# Patient Record
Sex: Female | Born: 1975 | Race: White | Hispanic: No | Smoking: Never smoker
Health system: Southern US, Community
[De-identification: ages and names within clinical notes are randomized; demographics above are authoritative.]

## PROBLEM LIST (undated history)

## (undated) DIAGNOSIS — O020 Blighted ovum and nonhydatidiform mole: Secondary | ICD-10-CM

## (undated) DIAGNOSIS — F419 Anxiety disorder, unspecified: Secondary | ICD-10-CM

## (undated) DIAGNOSIS — Z8719 Personal history of other diseases of the digestive system: Secondary | ICD-10-CM

## (undated) DIAGNOSIS — C801 Malignant (primary) neoplasm, unspecified: Secondary | ICD-10-CM

## (undated) DIAGNOSIS — K219 Gastro-esophageal reflux disease without esophagitis: Secondary | ICD-10-CM

## (undated) DIAGNOSIS — J189 Pneumonia, unspecified organism: Secondary | ICD-10-CM

## (undated) HISTORY — PX: HERNIA REPAIR: SHX51

## (undated) HISTORY — PX: DILATION AND CURETTAGE OF UTERUS: SHX78

## (undated) HISTORY — DX: Blighted ovum and nonhydatidiform mole: O02.0

## (undated) HISTORY — PX: ABDOMINAL HYSTERECTOMY: SHX81

## (undated) HISTORY — PX: APPENDECTOMY: SHX54

## (undated) HISTORY — PX: ESOPHAGOGASTRODUODENOSCOPY: SHX1529

---

## 2015-09-04 ENCOUNTER — Other Ambulatory Visit (HOSPITAL_COMMUNITY): Payer: Self-pay | Admitting: General Surgery

## 2015-09-12 ENCOUNTER — Other Ambulatory Visit: Payer: Self-pay

## 2015-09-12 ENCOUNTER — Ambulatory Visit (HOSPITAL_COMMUNITY)
Admission: RE | Admit: 2015-09-12 | Discharge: 2015-09-12 | Disposition: A | Discharge status: Home / Self Care | Payer: BC Managed Care – PPO | Source: Ambulatory Visit | Attending: General Surgery | Admitting: General Surgery

## 2015-09-12 DIAGNOSIS — Z01818 Encounter for other preprocedural examination: Secondary | ICD-10-CM | POA: Diagnosis not present

## 2015-09-12 DIAGNOSIS — K449 Diaphragmatic hernia without obstruction or gangrene: Secondary | ICD-10-CM | POA: Diagnosis not present

## 2015-09-16 ENCOUNTER — Ambulatory Visit (HOSPITAL_COMMUNITY): Payer: Self-pay

## 2015-09-23 ENCOUNTER — Encounter: Payer: Self-pay | Admitting: Skilled Nursing Facility1

## 2015-09-23 ENCOUNTER — Encounter: Payer: BC Managed Care – PPO | Attending: General Surgery | Admitting: Skilled Nursing Facility1

## 2015-09-23 DIAGNOSIS — E669 Obesity, unspecified: Secondary | ICD-10-CM

## 2015-09-23 DIAGNOSIS — Z6841 Body Mass Index (BMI) 40.0 and over, adult: Secondary | ICD-10-CM | POA: Insufficient documentation

## 2015-09-23 NOTE — Progress Notes (Signed)
  Pre-Op Assessment Visit:  Pre-Operative Sleeve Gastrectomy Surgery  Medical Nutrition Therapy:  Appt start time: 1100   End time:  1200.  Patient was seen on 09/23/2015 for Pre-Operative Nutrition Assessment. Assessment and letter of approval faxed to [facility] Surgery Bariatric Surgery Program coordinator on 09/23/2015.  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 Pts sister accompanied her to the appointment.  Pt states she has a great support system. Preferred Learning Style:   No preference indicated   Learning Readiness:   Ready  Handouts given during visit include:  Pre-Op Goals Bariatric Surgery Protein Shakes   During the appointment today the following Pre-Op Goals were reviewed with the patient: Maintain or lose weight as instructed by your surgeon Make healthy food choices Begin to limit portion sizes Limited concentrated sugars and fried foods Keep fat/sugar in the single digits per serving on   food labels Practice CHEWING your food  (aim for 30 chews per bite or until applesauce consistency) Practice not drinking 15 minutes before, during, and 30 minutes after each meal/snack Avoid all carbonated beverages  Avoid/limit caffeinated beverages  Avoid all sugar-sweetened beverages Consume 3 meals per day; eat every 3-5 hours Make a list of non-food related activities Aim for 64-100 ounces of FLUID daily  Aim for at least 60-80 grams of PROTEIN daily Look for a liquid protein source that contain ?15 g protein and ?5 g carbohydrate  (ex: shakes, drinks, shots)  Patient-Centered Goals: 10/10 specific/non-scale and confidence/importance scale 1-10  Demonstrated degree of understanding via:  Teach Back  Teaching Method Utilized:  Visual Auditory Hands on  Barriers to learning/adherence to lifestyle change: none identified  [facility] to call the [facility] to enroll in Pre-Op and Post-Op Nutrition Education when  surgery date is scheduled.

## 2015-09-25 ENCOUNTER — Ambulatory Visit (INDEPENDENT_AMBULATORY_CARE_PROVIDER_SITE_OTHER): Payer: BC Managed Care – PPO | Admitting: Licensed Clinical Social Worker

## 2015-09-25 DIAGNOSIS — F4322 Adjustment disorder with anxiety: Secondary | ICD-10-CM

## 2015-09-30 ENCOUNTER — Ambulatory Visit (INDEPENDENT_AMBULATORY_CARE_PROVIDER_SITE_OTHER): Payer: BC Managed Care – PPO | Admitting: Licensed Clinical Social Worker

## 2015-10-29 ENCOUNTER — Encounter: Payer: BC Managed Care – PPO | Attending: General Surgery

## 2015-10-29 DIAGNOSIS — Z6841 Body Mass Index (BMI) 40.0 and over, adult: Secondary | ICD-10-CM | POA: Insufficient documentation

## 2015-10-29 NOTE — Progress Notes (Signed)
  Pre-Operative Nutrition Class:  Appt start time: 2426   End time:  1830.  Patient was seen on 10/29/2015 for Pre-Operative Bariatric Surgery Education at the [facility].   Surgery date:  Surgery type: Sleeve gastrectomy Start weight at [facility]: 307 lbs on 09/23/2015 Weight today: 310.8 lbs  TANITA  BODY COMP RESULTS  10/29/15   BMI (kg/m^2) 53.3   Fat Mass (lbs) 167.8   Fat Free Mass (lbs) 143   Total Body Water (lbs) 107.2   Samples given per MNT protocol. Patient educated on appropriate usage: Bariatric Advantage Multivitamin (mixed berry - qty 1) Lot #: [id number] Exp: 08/2016  Bariatric Advantage Calcium Citrate chew (caramel - qty 1) Lot #: [id number] Exp: 12/2015  Premier protein shake (vanilla - qty 1) Lot #: [id number] Exp: 09/2016  Renee Pain Protein Powder (chocolate - qty 1) Lot #: [id number] Exp: 11/2016  The following the learning objectives were met by the patient during this course:  Identify Pre-Op Dietary Goals and will begin 2 weeks pre-operatively  Identify appropriate sources of fluids and proteins   State protein recommendations and appropriate sources pre and post-operatively  Identify Post-Operative Dietary Goals and will follow for 2 weeks post-operatively  Identify appropriate multivitamin and calcium sources  Describe the need for physical activity post-operatively and will follow MD recommendations  State when to call healthcare provider regarding medication questions or post-operative complications  Handouts given during class include:  Pre-Op Bariatric Surgery Diet Handout  Protein Shake Handout  Post-Op Bariatric Surgery Nutrition Handout  [facility] Program Information Flyer  Support Group Information Flyer  [facility] Bariatric Supplements Price List  Follow-Up Plan: Patient will follow-up at [facility] 2 weeks post operatively for diet advancement per MD.

## 2015-10-29 NOTE — Progress Notes (Signed)
Pt is being scheduled for preop appt; please place surgical orders in epic. Thanks.  

## 2015-10-30 ENCOUNTER — Other Ambulatory Visit: Payer: Self-pay | Admitting: General Surgery

## 2015-11-01 ENCOUNTER — Ambulatory Visit: Payer: Self-pay | Admitting: General Surgery

## 2015-11-01 ENCOUNTER — Other Ambulatory Visit: Payer: Self-pay | Admitting: General Surgery

## 2015-11-06 NOTE — Patient Instructions (Addendum)
Mary Williams  11/06/2015   Your procedure is scheduled on: Tuesday 11/12/2015  Report to [facility] Main  Entrance take [place]  elevators to 3rd floor to  [place] at   [place] AM.  Call this number if you have problems the morning of surgery [phone]   Remember: ONLY 1 PERSON MAY GO WITH YOU TO SHORT STAY TO GET  READY MORNING OF [name].   Do not eat food or drink liquids :After Midnight.     Take these medicines the morning of surgery with A SIP OF WATER: none                                You may not have any metal on your body including hair pins and              piercings  Do not wear jewelry, make-up, lotions, powders or perfumes, deodorant             Do not wear nail polish.  Do not shave  48 hours prior to surgery.              Men may shave face and neck.   Do not bring valuables to the hospital. [name].  Contacts, dentures or bridgework may not be worn into surgery.  Leave suitcase in the car. After surgery it may be brought to your room.                Please read over the following fact sheets you were given: _____________________________________________________________________             [facility] - Preparing for Surgery Before surgery, you can play an important role.  Because skin is not sterile, your skin needs to be as free of germs as possible.  You can reduce the number of germs on your skin by washing with CHG (chlorahexidine gluconate) soap before surgery.  CHG is an antiseptic cleaner which kills germs and bonds with the skin to continue killing germs even after washing. Please DO NOT use if you have an allergy to CHG or antibacterial soaps.  If your skin becomes reddened/irritated stop using the CHG and inform your nurse when you arrive at Short Stay. Do not shave (including legs and underarms) for at least 48 hours prior to the first CHG shower.  You may shave  your face/neck. Please follow these instructions carefully:  1.  Shower with CHG Soap the night before surgery and the  morning of Surgery.  2.  If you choose to wash your hair, wash your hair first as usual with your  normal  shampoo.  3.  After you shampoo, rinse your hair and body thoroughly to remove the  shampoo.                           4.  Use CHG as you would any other liquid soap.  You can apply chg directly  to the skin and wash                       Gently with a scrungie or clean washcloth.  5.  Apply the  CHG Soap to your body ONLY FROM THE NECK DOWN.   Do not use on face/ open                           Wound or open sores. Avoid contact with eyes, ears mouth and genitals (private parts).                       Wash face,  Genitals (private parts) with your normal soap.             6.  Wash thoroughly, paying special attention to the area where your surgery  will be performed.  7.  Thoroughly rinse your body with warm water from the neck down.  8.  DO NOT shower/wash with your normal soap after using and rinsing off  the CHG Soap.                9.  Pat yourself dry with a clean towel.            10.  Wear clean pajamas.            11.  Place clean sheets on your bed the night of your first shower and do not  sleep with pets. Day of Surgery : Do not apply any lotions/deodorants the morning of surgery.  Please wear clean clothes to the hospital/surgery center.  FAILURE TO FOLLOW THESE INSTRUCTIONS MAY RESULT IN THE CANCELLATION OF YOUR SURGERY PATIENT SIGNATURE_________________________________  NURSE SIGNATURE__________________________________  ________________________________________________________________________   Mary Williams  An incentive spirometer is a tool that can help keep your lungs clear and active. This tool measures how well you are filling your lungs with each breath. Taking long deep breaths may help reverse or decrease the chance of developing  breathing (pulmonary) problems (especially infection) following:  A long period of time when you are unable to move or be active. BEFORE THE PROCEDURE   If the spirometer includes an indicator to show your best effort, your nurse or respiratory therapist will set it to a desired goal.  If possible, sit up straight or lean slightly forward. Try not to slouch.  Hold the incentive spirometer in an upright position. INSTRUCTIONS FOR USE  1. Sit on the edge of your bed if possible, or sit up as far as you can in bed or on a chair. 2. Hold the incentive spirometer in an upright position. 3. Breathe out normally. 4. Place the mouthpiece in your mouth and seal your lips tightly around it. 5. Breathe in slowly and as deeply as possible, raising the piston or the ball toward the top of the column. 6. Hold your breath for 3-5 seconds or for as long as possible. Allow the piston or ball to fall to the bottom of the column. 7. Remove the mouthpiece from your mouth and breathe out normally. 8. Rest for a few seconds and repeat Steps 1 through 7 at least 10 times every 1-2 hours when you are awake. Take your time and take a few normal breaths between deep breaths. 9. The spirometer may include an indicator to show your best effort. Use the indicator as a goal to work toward during each repetition. 10. After each set of 10 deep breaths, practice coughing to be sure your lungs are clear. If you have an incision (the cut made at the time of surgery), support your incision when coughing by placing a pillow or rolled up  towels firmly against it. Once you are able to get out of bed, walk around indoors and cough well. You may stop using the incentive spirometer when instructed by your caregiver.  RISKS AND COMPLICATIONS  Take your time so you do not get dizzy or light-headed.  If you are in pain, you may need to take or ask for pain medication before doing incentive spirometry. It is harder to take a deep  breath if you are having pain. AFTER USE  Rest and breathe slowly and easily.  It can be helpful to keep track of a log of your progress. Your caregiver can provide you with a simple table to help with this. If you are using the spirometer at home, follow these instructions: [place] IF:   You are having difficultly using the spirometer.  You have trouble using the spirometer as often as instructed.  Your pain medication is not giving enough relief while using the spirometer.  You develop fever of 100.5 F (38.1 C) or higher. SEEK IMMEDIATE MEDICAL CARE IF:   You cough up bloody sputum that had not been present before.  You develop fever of 102 F (38.9 C) or greater.  You develop worsening pain at or near the incision site. MAKE SURE YOU:   Understand these instructions.  Will watch your condition.  Will get help right away if you are not doing well or get worse. Document Released: 06/22/2006 Document Revised: 05/04/2011 Document Reviewed: 08/23/2006 [facility] Patient Information 2014 [place], [place].   ________________________________________________________________________

## 2015-11-08 ENCOUNTER — Encounter (HOSPITAL_COMMUNITY)
Admission: RE | Admit: 2015-11-08 | Discharge: 2015-11-08 | Disposition: A | Discharge status: Home / Self Care | Payer: BC Managed Care – PPO | Source: Ambulatory Visit | Attending: General Surgery | Admitting: General Surgery

## 2015-11-08 ENCOUNTER — Encounter (INDEPENDENT_AMBULATORY_CARE_PROVIDER_SITE_OTHER): Payer: Self-pay

## 2015-11-08 ENCOUNTER — Encounter (HOSPITAL_COMMUNITY): Payer: Self-pay

## 2015-11-08 DIAGNOSIS — Z01812 Encounter for preprocedural laboratory examination: Secondary | ICD-10-CM | POA: Insufficient documentation

## 2015-11-08 HISTORY — DX: Anxiety disorder, unspecified: F41.9

## 2015-11-08 HISTORY — DX: Personal history of other diseases of the digestive system: Z87.19

## 2015-11-08 LAB — COMPREHENSIVE METABOLIC PANEL
ALBUMIN: 4 g/dL (ref 3.5–5.0)
ALK PHOS: 52 U/L (ref 38–126)
ALT: 19 U/L (ref 14–54)
ANION GAP: 6 (ref 5–15)
AST: 26 U/L (ref 15–41)
BILIRUBIN TOTAL: 0.4 mg/dL (ref 0.3–1.2)
BUN: 15 mg/dL (ref 6–20)
CALCIUM: 8.9 mg/dL (ref 8.9–10.3)
CO2: 26 mmol/L (ref 22–32)
Chloride: 106 mmol/L (ref 101–111)
Creatinine, Ser: 0.84 mg/dL (ref 0.44–1.00)
GLUCOSE: 157 mg/dL — AB (ref 65–99)
Potassium: 4 mmol/L (ref 3.5–5.1)
Sodium: 138 mmol/L (ref 135–145)
TOTAL PROTEIN: 7.8 g/dL (ref 6.5–8.1)

## 2015-11-08 LAB — CBC WITH DIFFERENTIAL/PLATELET
Basophils Absolute: 0 10*3/uL (ref 0.0–0.1)
Basophils Relative: 0 %
Eosinophils Absolute: 0.2 10*3/uL (ref 0.0–0.7)
Eosinophils Relative: 2 %
HEMATOCRIT: 40.2 % (ref 36.0–46.0)
HEMOGLOBIN: 13.1 g/dL (ref 12.0–15.0)
LYMPHS ABS: 3.2 10*3/uL (ref 0.7–4.0)
Lymphocytes Relative: 26 %
MCH: 28.1 pg (ref 26.0–34.0)
MCHC: 32.6 g/dL (ref 30.0–36.0)
MCV: 86.3 fL (ref 78.0–100.0)
MONOS PCT: 5 %
Monocytes Absolute: 0.6 10*3/uL (ref 0.1–1.0)
NEUTROS ABS: 8.3 10*3/uL — AB (ref 1.7–7.7)
NEUTROS PCT: 67 %
Platelets: 365 10*3/uL (ref 150–400)
RBC: 4.66 MIL/uL (ref 3.87–5.11)
RDW: 13.9 % (ref 11.5–15.5)
WBC: 12.3 10*3/uL — ABNORMAL HIGH (ref 4.0–10.5)

## 2015-11-08 NOTE — Progress Notes (Signed)
09/12/2015-noted in [facility] and EKG

## 2015-11-11 NOTE — Anesthesia Preprocedure Evaluation (Signed)
Anesthesia Evaluation  Patient identified by MRN, date of birth, ID band Patient awake    Reviewed: Allergy & Precautions, NPO status , Patient's Chart, lab work & pertinent test results  Airway Mallampati: II  TM Distance: >3 FB Neck ROM: Full    Dental no notable dental hx.    Pulmonary neg pulmonary ROS,    Pulmonary exam normal breath sounds clear to auscultation       Cardiovascular negative cardio ROS Normal cardiovascular exam Rhythm:Regular Rate:Normal     Neuro/Psych PSYCHIATRIC DISORDERS Anxiety negative neurological ROS     GI/Hepatic negative GI ROS, Neg liver ROS, hiatal hernia,   Endo/Other  Morbid obesity  Renal/GU negative Renal ROS     Musculoskeletal negative musculoskeletal ROS (+)   Abdominal   Peds  Hematology negative hematology ROS (+)   Anesthesia Other Findings   Reproductive/Obstetrics negative OB ROS                            Anesthesia Physical Anesthesia Plan  ASA: III  Anesthesia Plan: General   Post-op Pain Management:    Induction: Intravenous  Airway Management Planned: Oral ETT  Additional Equipment:   Intra-op Plan:   Post-operative Plan: Extubation in OR  Informed Consent: I have reviewed the patients History and Physical, chart, labs and discussed the procedure including the risks, benefits and alternatives for the proposed anesthesia with the patient or authorized representative who has indicated his/her understanding and acceptance.   Dental advisory given  Plan Discussed with: CRNA  Anesthesia Plan Comments:         Anesthesia Quick Evaluation

## 2015-11-12 ENCOUNTER — Inpatient Hospital Stay (HOSPITAL_COMMUNITY)
Admission: RE | Admit: 2015-11-12 | Discharge: 2015-11-15 | DRG: 621 | Disposition: A | Discharge status: Home / Self Care | Payer: BC Managed Care – PPO | Source: Ambulatory Visit | Attending: General Surgery | Admitting: General Surgery

## 2015-11-12 ENCOUNTER — Inpatient Hospital Stay (HOSPITAL_COMMUNITY): Payer: BC Managed Care – PPO | Admitting: Anesthesiology

## 2015-11-12 ENCOUNTER — Encounter (HOSPITAL_COMMUNITY)
Admission: RE | Disposition: A | Discharge status: Home / Self Care | Payer: Self-pay | Source: Ambulatory Visit | Attending: General Surgery

## 2015-11-12 ENCOUNTER — Encounter (HOSPITAL_COMMUNITY): Payer: Self-pay | Admitting: *Deleted

## 2015-11-12 DIAGNOSIS — Z90711 Acquired absence of uterus with remaining cervical stump: Secondary | ICD-10-CM

## 2015-11-12 DIAGNOSIS — Z79899 Other long term (current) drug therapy: Secondary | ICD-10-CM | POA: Diagnosis not present

## 2015-11-12 DIAGNOSIS — K449 Diaphragmatic hernia without obstruction or gangrene: Secondary | ICD-10-CM | POA: Diagnosis present

## 2015-11-12 DIAGNOSIS — K219 Gastro-esophageal reflux disease without esophagitis: Secondary | ICD-10-CM | POA: Diagnosis present

## 2015-11-12 DIAGNOSIS — D72829 Elevated white blood cell count, unspecified: Secondary | ICD-10-CM | POA: Diagnosis not present

## 2015-11-12 DIAGNOSIS — Z8249 Family history of ischemic heart disease and other diseases of the circulatory system: Secondary | ICD-10-CM | POA: Diagnosis not present

## 2015-11-12 DIAGNOSIS — Z803 Family history of malignant neoplasm of breast: Secondary | ICD-10-CM | POA: Diagnosis not present

## 2015-11-12 DIAGNOSIS — Z9884 Bariatric surgery status: Secondary | ICD-10-CM

## 2015-11-12 DIAGNOSIS — R110 Nausea: Secondary | ICD-10-CM | POA: Diagnosis not present

## 2015-11-12 DIAGNOSIS — Z833 Family history of diabetes mellitus: Secondary | ICD-10-CM | POA: Diagnosis not present

## 2015-11-12 DIAGNOSIS — Z6841 Body Mass Index (BMI) 40.0 and over, adult: Secondary | ICD-10-CM

## 2015-11-12 DIAGNOSIS — Z8261 Family history of arthritis: Secondary | ICD-10-CM

## 2015-11-12 DIAGNOSIS — G8929 Other chronic pain: Secondary | ICD-10-CM | POA: Diagnosis present

## 2015-11-12 DIAGNOSIS — M549 Dorsalgia, unspecified: Secondary | ICD-10-CM | POA: Diagnosis present

## 2015-11-12 DIAGNOSIS — F419 Anxiety disorder, unspecified: Secondary | ICD-10-CM | POA: Diagnosis present

## 2015-11-12 HISTORY — PX: LAPAROSCOPIC GASTRIC SLEEVE RESECTION WITH HIATAL HERNIA REPAIR: SHX6512

## 2015-11-12 HISTORY — PX: UPPER GI ENDOSCOPY: SHX6162

## 2015-11-12 LAB — PREGNANCY, URINE: Preg Test, Ur: NEGATIVE

## 2015-11-12 LAB — HEMOGLOBIN AND HEMATOCRIT, BLOOD
HCT: 40.1 % (ref 36.0–46.0)
Hemoglobin: 13.1 g/dL (ref 12.0–15.0)

## 2015-11-12 SURGERY — GASTRECTOMY, SLEEVE, LAPAROSCOPIC, WITH HIATAL HERNIA REPAIR
Anesthesia: General | Site: Abdomen

## 2015-11-12 MED ORDER — CHLORHEXIDINE GLUCONATE CLOTH 2 % EX PADS: 6.0000 | MEDICATED_PAD | Freq: Once | CUTANEOUS | Status: DC

## 2015-11-12 MED ORDER — FENTANYL CITRATE (PF) 100 MCG/2ML IJ SOLN
INTRAMUSCULAR | Status: AC
  Filled 2015-11-12: qty 4

## 2015-11-12 MED ORDER — APREPITANT 40 MG PO CAPS
40.0000 mg | ORAL_CAPSULE | ORAL | Status: AC
  Administered 2015-11-12: 40 mg via ORAL
  Filled 2015-11-12: qty 1

## 2015-11-12 MED ORDER — BUPIVACAINE-EPINEPHRINE 0.25% -1:200000 IJ SOLN
INTRAMUSCULAR | Status: DC | PRN
  Administered 2015-11-12: 35 mL

## 2015-11-12 MED ORDER — LIDOCAINE 2% (20 MG/ML) 5 ML SYRINGE
INTRAMUSCULAR | Status: AC
  Filled 2015-11-12: qty 5

## 2015-11-12 MED ORDER — EVICEL 5 ML EX KIT
PACK | CUTANEOUS | Status: DC | PRN
  Administered 2015-11-12: 1

## 2015-11-12 MED ORDER — ACETAMINOPHEN 10 MG/ML IV SOLN
INTRAVENOUS | Status: DC | PRN
  Administered 2015-11-12: 1000 mg via INTRAVENOUS

## 2015-11-12 MED ORDER — DEXAMETHASONE SODIUM PHOSPHATE 10 MG/ML IJ SOLN
INTRAMUSCULAR | Status: AC
  Filled 2015-11-12: qty 1

## 2015-11-12 MED ORDER — PROMETHAZINE HCL 25 MG/ML IJ SOLN
6.2500 mg | INTRAMUSCULAR | Status: DC | PRN
  Administered 2015-11-12: 6.25 mg via INTRAVENOUS

## 2015-11-12 MED ORDER — POTASSIUM CHLORIDE IN NACL 20-0.9 MEQ/L-% IV SOLN
INTRAVENOUS | Status: DC
  Administered 2015-11-12: 14:00:00 via INTRAVENOUS
  Administered 2015-11-13: 100 mL/h via INTRAVENOUS
  Administered 2015-11-13 – 2015-11-15 (×2): via INTRAVENOUS
  Filled 2015-11-12 (×7): qty 1000

## 2015-11-12 MED ORDER — PHENYLEPHRINE HCL 10 MG/ML IJ SOLN
INTRAMUSCULAR | Status: DC | PRN
  Administered 2015-11-12: 80 ug via INTRAVENOUS

## 2015-11-12 MED ORDER — LIP MEDEX EX OINT
TOPICAL_OINTMENT | CUTANEOUS | Status: AC
  Filled 2015-11-12: qty 7

## 2015-11-12 MED ORDER — 0.9 % SODIUM CHLORIDE (POUR BTL) OPTIME
TOPICAL | Status: DC | PRN
  Administered 2015-11-12: 1000 mL

## 2015-11-12 MED ORDER — MIDAZOLAM HCL 2 MG/2ML IJ SOLN
INTRAMUSCULAR | Status: AC
  Filled 2015-11-12: qty 2

## 2015-11-12 MED ORDER — BUPIVACAINE-EPINEPHRINE 0.5% -1:200000 IJ SOLN
INTRAMUSCULAR | Status: AC
  Filled 2015-11-12: qty 1

## 2015-11-12 MED ORDER — ONDANSETRON HCL 4 MG/2ML IJ SOLN
INTRAMUSCULAR | Status: DC | PRN
  Administered 2015-11-12: 4 mg via INTRAVENOUS

## 2015-11-12 MED ORDER — LIDOCAINE HCL (CARDIAC) 20 MG/ML IV SOLN
INTRAVENOUS | Status: DC | PRN
  Administered 2015-11-12: 100 mg via INTRAVENOUS

## 2015-11-12 MED ORDER — PROMETHAZINE HCL 25 MG/ML IJ SOLN
INTRAMUSCULAR | Status: AC
Start: 2015-11-12 — End: 2015-11-12
  Filled 2015-11-12: qty 1

## 2015-11-12 MED ORDER — EVICEL 5 ML EX KIT
PACK | Freq: Once | CUTANEOUS | Status: DC
  Filled 2015-11-12: qty 1

## 2015-11-12 MED ORDER — ROCURONIUM BROMIDE 100 MG/10ML IV SOLN
INTRAVENOUS | Status: AC
  Filled 2015-11-12: qty 1

## 2015-11-12 MED ORDER — PROPOFOL 10 MG/ML IV BOLUS
INTRAVENOUS | Status: DC | PRN
  Administered 2015-11-12: 200 mg via INTRAVENOUS

## 2015-11-12 MED ORDER — ROCURONIUM BROMIDE 100 MG/10ML IV SOLN
INTRAVENOUS | Status: DC | PRN
  Administered 2015-11-12: 20 mg via INTRAVENOUS
  Administered 2015-11-12: 40 mg via INTRAVENOUS

## 2015-11-12 MED ORDER — SODIUM CHLORIDE 0.9 % IJ SOLN
INTRAMUSCULAR | Status: AC
  Filled 2015-11-12: qty 10

## 2015-11-12 MED ORDER — ACETAMINOPHEN 160 MG/5ML PO SOLN: 325.0000 mg | ORAL | Status: DC | PRN

## 2015-11-12 MED ORDER — ENOXAPARIN SODIUM 30 MG/0.3ML ~~LOC~~ SOLN
30.0000 mg | Freq: Two times a day (BID) | SUBCUTANEOUS | Status: DC
  Administered 2015-11-12 – 2015-11-14 (×5): 30 mg via SUBCUTANEOUS
  Filled 2015-11-12 (×5): qty 0.3

## 2015-11-12 MED ORDER — HYDROMORPHONE HCL 1 MG/ML IJ SOLN
INTRAMUSCULAR | Status: AC
  Filled 2015-11-12: qty 1

## 2015-11-12 MED ORDER — ONDANSETRON HCL 4 MG/2ML IJ SOLN
4.0000 mg | INTRAMUSCULAR | Status: DC | PRN
  Administered 2015-11-12 – 2015-11-15 (×11): 4 mg via INTRAVENOUS
  Filled 2015-11-12 (×11): qty 2

## 2015-11-12 MED ORDER — ACETAMINOPHEN 160 MG/5ML PO SOLN
650.0000 mg | ORAL | Status: DC | PRN
  Administered 2015-11-14 – 2015-11-15 (×2): 650 mg via ORAL
  Filled 2015-11-12 (×2): qty 20.3

## 2015-11-12 MED ORDER — DEXAMETHASONE SODIUM PHOSPHATE 10 MG/ML IJ SOLN
INTRAMUSCULAR | Status: DC | PRN
  Administered 2015-11-12: 10 mg via INTRAVENOUS

## 2015-11-12 MED ORDER — BUPIVACAINE LIPOSOME 1.3 % IJ SUSP
INTRAMUSCULAR | Status: DC | PRN
  Administered 2015-11-12: 20 mL

## 2015-11-12 MED ORDER — OXYCODONE HCL 5 MG PO TABS: 5.0000 mg | ORAL_TABLET | Freq: Once | ORAL | Status: DC | PRN

## 2015-11-12 MED ORDER — MIDAZOLAM HCL 5 MG/5ML IJ SOLN
INTRAMUSCULAR | Status: DC | PRN
  Administered 2015-11-12: 2 mg via INTRAVENOUS

## 2015-11-12 MED ORDER — OXYCODONE HCL 5 MG/5ML PO SOLN: 5.0000 mg | Freq: Once | ORAL | Status: DC | PRN

## 2015-11-12 MED ORDER — OXYCODONE HCL 5 MG/5ML PO SOLN
5.0000 mg | ORAL | Status: DC | PRN
  Administered 2015-11-13 (×2): 10 mg via ORAL
  Administered 2015-11-13: 5 mg via ORAL
  Administered 2015-11-14 – 2015-11-15 (×8): 10 mg via ORAL
  Filled 2015-11-12: qty 5
  Filled 2015-11-12 (×11): qty 10

## 2015-11-12 MED ORDER — LEVOFLOXACIN IN D5W 750 MG/150ML IV SOLN
750.0000 mg | INTRAVENOUS | Status: AC
  Administered 2015-11-12: 750 mg via INTRAVENOUS
  Filled 2015-11-12: qty 150

## 2015-11-12 MED ORDER — PREMIER PROTEIN SHAKE
2.0000 [oz_av] | ORAL | Status: DC
  Administered 2015-11-14 – 2015-11-15 (×7): 2 [oz_av] via ORAL
  Filled 2015-11-12: qty 325.31

## 2015-11-12 MED ORDER — SODIUM CHLORIDE 0.9 % IJ SOLN
INTRAMUSCULAR | Status: DC | PRN
  Administered 2015-11-12: 60 mL via INTRAVENOUS

## 2015-11-12 MED ORDER — MORPHINE SULFATE (PF) 2 MG/ML IV SOLN
2.0000 mg | INTRAVENOUS | Status: DC | PRN
  Administered 2015-11-12: 2 mg via INTRAVENOUS
  Administered 2015-11-12 – 2015-11-13 (×5): 4 mg via INTRAVENOUS
  Administered 2015-11-13: 6 mg via INTRAVENOUS
  Administered 2015-11-13 – 2015-11-14 (×3): 4 mg via INTRAVENOUS
  Filled 2015-11-12 (×4): qty 2
  Filled 2015-11-12: qty 1
  Filled 2015-11-12 (×4): qty 2
  Filled 2015-11-12: qty 3

## 2015-11-12 MED ORDER — SUCCINYLCHOLINE CHLORIDE 20 MG/ML IJ SOLN
INTRAMUSCULAR | Status: DC | PRN
  Administered 2015-11-12: 100 mg via INTRAVENOUS

## 2015-11-12 MED ORDER — BUPIVACAINE LIPOSOME 1.3 % IJ SUSP
20.0000 mL | Freq: Once | INTRAMUSCULAR | Status: DC
  Filled 2015-11-12: qty 20

## 2015-11-12 MED ORDER — LACTATED RINGERS IV SOLN
INTRAVENOUS | Status: DC
  Administered 2015-11-12 (×2): via INTRAVENOUS

## 2015-11-12 MED ORDER — ONDANSETRON HCL 4 MG/2ML IJ SOLN
INTRAMUSCULAR | Status: AC
  Filled 2015-11-12: qty 2

## 2015-11-12 MED ORDER — FAMOTIDINE IN NACL 20-0.9 MG/50ML-% IV SOLN
20.0000 mg | Freq: Two times a day (BID) | INTRAVENOUS | Status: DC
  Administered 2015-11-12 – 2015-11-14 (×6): 20 mg via INTRAVENOUS
  Filled 2015-11-12 (×8): qty 50

## 2015-11-12 MED ORDER — MEPERIDINE HCL 50 MG/ML IJ SOLN: 6.2500 mg | INTRAMUSCULAR | Status: DC | PRN

## 2015-11-12 MED ORDER — PROPOFOL 10 MG/ML IV BOLUS
INTRAVENOUS | Status: AC
  Filled 2015-11-12: qty 40

## 2015-11-12 MED ORDER — HYDROMORPHONE HCL 1 MG/ML IJ SOLN
0.2500 mg | INTRAMUSCULAR | Status: DC | PRN
  Administered 2015-11-12: 0.5 mg via INTRAVENOUS

## 2015-11-12 MED ORDER — LACTATED RINGERS IR SOLN
Status: DC | PRN
  Administered 2015-11-12: 1000 mL

## 2015-11-12 MED ORDER — HEPARIN SODIUM (PORCINE) 5000 UNIT/ML IJ SOLN
5000.0000 [IU] | INTRAMUSCULAR | Status: AC
  Administered 2015-11-12: 5000 [IU] via SUBCUTANEOUS
  Filled 2015-11-12: qty 1

## 2015-11-12 MED ORDER — ACETAMINOPHEN 10 MG/ML IV SOLN
INTRAVENOUS | Status: AC
Start: 2015-11-12 — End: 2015-11-12
  Filled 2015-11-12: qty 100

## 2015-11-12 MED ORDER — LEVOFLOXACIN IN D5W 750 MG/150ML IV SOLN
INTRAVENOUS | Status: AC
  Filled 2015-11-12: qty 150

## 2015-11-12 MED ORDER — FENTANYL CITRATE (PF) 100 MCG/2ML IJ SOLN
INTRAMUSCULAR | Status: DC | PRN
  Administered 2015-11-12 (×6): 50 ug via INTRAVENOUS

## 2015-11-12 MED ORDER — FENTANYL CITRATE (PF) 100 MCG/2ML IJ SOLN
INTRAMUSCULAR | Status: AC
  Filled 2015-11-12: qty 2

## 2015-11-12 MED ORDER — SUGAMMADEX SODIUM 500 MG/5ML IV SOLN
INTRAVENOUS | Status: DC | PRN
  Administered 2015-11-12: 500 mg via INTRAVENOUS

## 2015-11-12 MED ORDER — SODIUM CHLORIDE 0.9 % IJ SOLN
INTRAMUSCULAR | Status: AC
  Filled 2015-11-12: qty 50

## 2015-11-12 SURGICAL SUPPLY — 62 items
APPLICATOR COTTON TIP 6IN STRL (MISCELLANEOUS) IMPLANT
APPLIER CLIP ROT 10 11.4 M/L (STAPLE)
APPLIER CLIP ROT 13.4 12 LRG (CLIP)
BLADE SURG SZ11 CARB STEEL (BLADE) ×4 IMPLANT
CABLE HIGH FREQUENCY MONO STRZ (ELECTRODE) ×4 IMPLANT
CHLORAPREP W/TINT 26ML (MISCELLANEOUS) ×8 IMPLANT
CLIP APPLIE ROT 10 11.4 M/L (STAPLE) IMPLANT
CLIP APPLIE ROT 13.4 12 LRG (CLIP) IMPLANT
COVER SURGICAL LIGHT HANDLE (MISCELLANEOUS) ×4 IMPLANT
DEVICE SUT QUICK LOAD TK 5 (STAPLE) ×6 IMPLANT
DEVICE SUT TI-KNOT TK 5X26 (MISCELLANEOUS) ×3 IMPLANT
DEVICE SUTURE ENDOST 10MM (ENDOMECHANICALS) ×4 IMPLANT
DEVICE TI KNOT TK5 (MISCELLANEOUS) ×1
DRAPE UTILITY XL STRL (DRAPES) ×8 IMPLANT
ELECT REM PT RETURN 9FT ADLT (ELECTROSURGICAL) ×4
ELECTRODE REM PT RTRN 9FT ADLT (ELECTROSURGICAL) ×2 IMPLANT
GAUZE SPONGE 4X4 12PLY STRL (GAUZE/BANDAGES/DRESSINGS) IMPLANT
GLOVE BIOGEL PI IND STRL 7.5 (GLOVE) ×2 IMPLANT
GLOVE BIOGEL PI INDICATOR 7.5 (GLOVE) ×2
GLOVE ECLIPSE 7.5 STRL STRAW (GLOVE) ×4 IMPLANT
GOWN STRL REUS W/TWL XL LVL3 (GOWN DISPOSABLE) ×16 IMPLANT
GRASPER SUT TROCAR 14GX15 (MISCELLANEOUS) ×4 IMPLANT
HOVERMATT SINGLE USE (MISCELLANEOUS) ×4 IMPLANT
IRRIG SUCT STRYKERFLOW 2 WTIP (MISCELLANEOUS) ×4
IRRIGATION SUCT STRKRFLW 2 WTP (MISCELLANEOUS) ×2 IMPLANT
KIT BASIN OR (CUSTOM PROCEDURE TRAY) ×4 IMPLANT
LIQUID BAND (GAUZE/BANDAGES/DRESSINGS) ×4 IMPLANT
MARKER SKIN DUAL TIP RULER LAB (MISCELLANEOUS) ×4 IMPLANT
NEEDLE SPNL 22GX3.5 QUINCKE BK (NEEDLE) ×4 IMPLANT
PACK UNIVERSAL I (CUSTOM PROCEDURE TRAY) ×4 IMPLANT
POUCH SPECIMEN RETRIEVAL 10MM (ENDOMECHANICALS) IMPLANT
QUICK LOAD TK 5 (STAPLE) ×2
RELOAD STAPLER BLUE 60MM (STAPLE) ×6 IMPLANT
RELOAD STAPLER GOLD 60MM (STAPLE) ×2 IMPLANT
RELOAD STAPLER GREEN 60MM (STAPLE) ×4 IMPLANT
SCISSORS LAP 5X45 EPIX DISP (ENDOMECHANICALS) ×4 IMPLANT
SHEARS HARMONIC ACE PLUS 45CM (MISCELLANEOUS) ×4 IMPLANT
SLEEVE ADV FIXATION 5X100MM (TROCAR) ×4 IMPLANT
SLEEVE GASTRECTOMY 36FR VISIGI (MISCELLANEOUS) ×4 IMPLANT
SOLUTION ANTI FOG 6CC (MISCELLANEOUS) ×4 IMPLANT
SPONGE LAP 18X18 X RAY DECT (DISPOSABLE) ×4 IMPLANT
STAPLER ECHELON LONG 60 440 (INSTRUMENTS) ×4 IMPLANT
STAPLER RELOAD BLUE 60MM (STAPLE) ×12
STAPLER RELOAD GOLD 60MM (STAPLE) ×4
STAPLER RELOAD GREEN 60MM (STAPLE) ×8
SUT DEVICE BRAIDED 0X39 (SUTURE) IMPLANT
SUT MNCRL AB 4-0 PS2 18 (SUTURE) ×4 IMPLANT
SUT SURGIDAC NAB ES-9 0 48 120 (SUTURE) ×8 IMPLANT
SUT VICRYL 0 TIES 12 18 (SUTURE) ×4 IMPLANT
SYR 10ML ECCENTRIC (SYRINGE) ×4 IMPLANT
SYR 20CC LL (SYRINGE) ×4 IMPLANT
TIP RIGID 35CM EVICEL (HEMOSTASIS) ×4 IMPLANT
TOWEL OR 17X26 10 PK STRL BLUE (TOWEL DISPOSABLE) ×4 IMPLANT
TOWEL OR NON WOVEN STRL DISP B (DISPOSABLE) ×4 IMPLANT
TROCAR ADV FIXATION 5X100MM (TROCAR) ×4 IMPLANT
TROCAR BLADELESS 15MM (ENDOMECHANICALS) ×4 IMPLANT
TROCAR BLADELESS OPT 5 100 (ENDOMECHANICALS) ×4 IMPLANT
TUBE CALIBRATION LAPBAND (TUBING) ×4 IMPLANT
TUBING CONNECTING 10 (TUBING) ×6 IMPLANT
TUBING CONNECTING 10' (TUBING) ×2
TUBING ENDO SMARTCAP PENTAX (MISCELLANEOUS) ×4 IMPLANT
TUBING INSUF HEATED (TUBING) ×4 IMPLANT

## 2015-11-12 NOTE — Op Note (Signed)
Mary Williams [id number] 1975-11-06 11/12/2015  Preoperative diagnosis: sleeve gastrectomy and hiatal hernia  Postoperative diagnosis: Same   Procedure: Upper endoscopy   Surgeon: Catalina [place]igua B. Hassell Done  M.D., FACS   Anesthesia: Gen.   Indications for procedure: This patient was undergoing a sleeve gastrectomy.  Endoscopy performed to rule out bleeding or leaks   Description of procedure: The endoscopy was placed in the mouth and into the oropharynx and under endoscopic vision it was advanced to the esophagogastric junction.  The sleeve was insufflated and the GE junction appreciated.  The staple line was intact and not bleeding.  The sleeve was cylindrical.  The pylorous was identified and with insufflation no bubbles were seen. .   No bleeding or leaks were detected.  The scope was withdrawn without difficulty.     Matt B. Hassell Done, MD, FACS General, Bariatric, & Minimally Invasive Surgery [facility], [place]

## 2015-11-12 NOTE — Anesthesia Postprocedure Evaluation (Signed)
Anesthesia Post Note  Patient: Mary Williams  Procedure(s) Performed: Procedure(s) (LRB): LAPAROSCOPIC GASTRIC SLEEVE RESECTION WITH HIATAL HERNIA REPAIR WITH UPPER ENDO (N/A) UPPER GI ENDOSCOPY  Patient location during evaluation: PACU Anesthesia Type: General Level of consciousness: sedated and patient cooperative Pain management: pain level controlled Vital Signs Assessment: post-procedure vital signs reviewed and stable Respiratory status: spontaneous breathing Cardiovascular status: stable Anesthetic complications: no    Last Vitals:  Vitals:   11/12/15 1231 11/12/15 1351  BP: 122/77 130/81  Pulse: (!) 58 (!) 55  Resp: 18 20  Temp: 36.6 C 36.7 C    Last Pain:  Vitals:   11/12/15 1210  TempSrc:   PainSc: [place]

## 2015-11-12 NOTE — Op Note (Signed)
Preoperative Diagnosis: MORBID OBESITY  Postoprative Diagnosis: MORBID OBESITY  Procedure: Procedure(s): LAPAROSCOPIC GASTRIC SLEEVE RESECTION WITH UPPER ENDO, REPAIR OF HIATAL HERNIA AND UPPER GI ENDOSCOPY   Surgeon: Excell Seltzer T   Assistants: Johnathan Hausen  Anesthesia:  General endotracheal anesthesia  Indications: Patient is a 40 year old female with progressive morbid obesity unresponsive to medical management who presents with a BMI of 51.  After extensive preoperative workup and discussion detailed elsewhere we have elected to proceed with laparoscopic sleeve gastrectomy for treatment of her morbid obesity. Preoperative workup indicated a small hiatal hernia and we discussed that we would examine for this and repair if found.    Procedure Detail:  Patient was brought to the operating room, placed in the supine position on the operating table, and general endotracheal anesthesia induced. The abdomen was widely sterilely prepped and draped. She had received preoperative IV antibiotics and subcutaneous heparin. PAS replaced. Patient timeout was performed and correct procedure verified. Access was obtained with 5 mm Optiview trocar in the left upper quadrant. Pneumoperitoneum was established. There was no evidence of trocar injury. Under direct vision a 5 mm trocar was placed laterally in the right upper quadrant, a 15 mm trocar in the right midabdomen at the base of the falciform ligament and a 5 mm trocar above and to the left of the umbilicus for the camera port. A 5 mm trocar was placed laterally in the left upper quadrant. A bilateral T AP block was performed under direct vision with Exparel diluted 80 mL. Patient was placed in steep reverse Trendelenburg. Through a 5 mm subxiphoid site the [facility] retractor was placed in the left lobe of the liver elevated with excellent exposure of the stomach and the hiatus. Beginning at the mid to distal greater curve vasculature was dissected  along the greater curve with the Harmonic scalpel and the lesser sac entered. The dissection progressed proximally along the greater curve. Short gastric vessels were individually divided with the Harmonic scalpel. The fundus was completely freed from the spleen. The left crus was dissected and completely skeletonized up to the hiatus. The greater curve vasculature was then dissected distally with Harmonic scalpel until the greater curve was completely freed to 5 cm from the pylorus. There were no posterior adhesions and at this point the stomach was completely free along its lesser curve vasculature. The sizing balloon was passed orally into the stomach and the 10 mL balloon pulled back and did advance easily up through the hiatus into the esophagus. The balloon was desufflated. Proceed with hiatal hernia repair. The gastrohepatic omentum was divided along an avascular area with the harmonic scalpel dissection carried up along the anterior esophagus. The anterior border of the right crus was dissected in the retroesophageal space entered and the left crus dissected. There was a moderate sized hiatal hernia. This was repaired with 2-0 Ethibond sutures placed with the Endo Close. At this point the sizing tube was passed back down into the stomach and the 10 mL balloon brought up snuggly against the hiatus. The balloon was desufflated and this tube was removed. The VisiG tube was advanced in the stomach and passed through this tip to the pylorus and then positioned along the lesser curve with the stomach splayed out symmetrically and the tube was placed on suction. The sleeve was begun with an initial firing of the green load 60 mm stapler beginning 5 cm from the pylorus. A second firing of the green load stapler was used which carried  the staple line up past the incisura but staying well away from the tube at this point allowing some extra room at the incisura. A gold load firing was then used working gradually back  toward the VisiG tube above the incisura. The sleeve was then completed with 3 more firings of the 60 mm blue load stapler along next to the VisiG tube but not tight against it and the final firing angling just out lateral to the esophageal fat pad. The sleeve was insufflated using the VisiG and appeared symmetrical and there was no evidence of leak under saline irrigation and no bleeding. The VisiG tube was removed after suctioning the stomach. Dr. Hassell Done performed upper endoscopy showing asymmetrical tubular sleeve with no evidence of leak under irrigation and no bleeding. The staple line was coated with Evicel. There was no evidence of bleeding or trocar injury. The specimen was brought out through the 15 mm trocar site after dilating the fascia and this fascial defect was closed with interrupted 0 Vicryl. The [place] retractor was removed under direct vision. All CO2 was evacuated and trochars removed. Skin incisions were closed with subcuticular Monocryl and Liquiban. Sponge needle and instrument counts were correct.    Findings: As above  Estimated Blood Loss:  Minimal         Drains: None  Blood Given: none          Specimens: Greater curvature of stomach        Complications:  * No complications entered in OR log *         Disposition: PACU - hemodynamically stable.         Condition: stable

## 2015-11-12 NOTE — Interval H&P Note (Signed)
History and Physical Interval Note:  11/12/2015 7:09 AM  Mary Williams  has presented today for surgery, with the diagnosis of MORBID OBESITY  The various methods of treatment have been discussed with the patient and family. After consideration of risks, benefits and other options for treatment, the patient has consented to  Procedure(s): LAPAROSCOPIC GASTRIC SLEEVE RESECTION WITH HIATAL HERNIA REPAIR WITH UPPER ENDO (N/A) as a surgical intervention .  The patient's history has been reviewed, patient examined, no change in status, stable for surgery.  I have reviewed the patient's chart and labs.  Questions were answered to the patient's satisfaction.     Joceline Hinchcliff T

## 2015-11-12 NOTE — Anesthesia Procedure Notes (Signed)
Procedure Name: Intubation Date/Time: 11/12/2015 7:25 AM Performed by: West Pugh Pre-anesthesia Checklist: Patient identified, Emergency Drugs available, Suction available, Patient being monitored and Timeout performed Patient Re-evaluated:Patient Re-evaluated prior to inductionOxygen Delivery Method: Circle system utilized Preoxygenation: Pre-oxygenation with 100% oxygen Intubation Type: IV induction Ventilation: Mask ventilation without difficulty Laryngoscope Size: Mac and 4 Grade View: Grade I Tube type: Oral Tube size: 7.5 mm Number of attempts: 1 Airway Equipment and Method: Stylet Placement Confirmation: ETT inserted through vocal cords under direct vision and positive ETCO2 Secured at: 23 cm Tube secured with: Tape Dental Injury: Teeth and Oropharynx as per pre-operative assessment

## 2015-11-12 NOTE — Transfer of Care (Signed)
Immediate Anesthesia Transfer of Care Note  Patient: Mary Williams  Procedure(s) Performed: Procedure(s): LAPAROSCOPIC GASTRIC SLEEVE RESECTION WITH HIATAL HERNIA REPAIR WITH UPPER ENDO (N/A) UPPER GI ENDOSCOPY  Patient Location: PACU  Anesthesia Type:General  Level of Consciousness:  sedated, patient cooperative and responds to stimulation  Airway & Oxygen Therapy:Patient Spontanous Breathing and Patient connected to face mask oxgen  Post-op Assessment:  Report given to PACU RN and Post -op Vital signs reviewed and stable  Post vital signs:  Reviewed and stable  Last Vitals:  Vitals:   11/12/15 0545  BP: 121/60  Pulse: 62  Resp: 16  Temp: [id number] C    Complications: No apparent anesthesia complications

## 2015-11-12 NOTE — Discharge Instructions (Addendum)
[facility] Discharge Instructions  °On the day of surgery you were given the medication aprepitant. This medication interacts with hormonal forms of birth control (oral contraceptives and injected or implanted birth control) and may make them ineffective. °IF YOU USE ANY HORMONAL FORM OF BIRTH CONTROL, YOU MUST USE AN ADDITIONAL BARRIER BIRTH CONTROL METHOD FOR ONE MONTH after receiving aprepitant or there is a chance you could become pregnant. ° ° ° ° °GASTRIC BYPASS/SLEEVE ° Home Care Instructions ° ° These instructions are to help you care for yourself when you go home. ° °Call: If you have any problems. °• Call [phone] and ask for the surgeon on call °• If you need immediate assistance come to the ER at [facility]. Tell the ER staff you are a new post-op gastric bypass or gastric sleeve patient  °Signs and symptoms to report: • Severe  vomiting or nausea °o If you cannot handle clear liquids for longer than 1 day, call your surgeon °• Abdominal pain which does not get better after taking your pain medication °• Fever greater than 100.4°  F and chills °• Heart rate over 100 beats a minute °• Trouble breathing °• Chest pain °• Redness,  swelling, drainage, or foul odor at incision (surgical) sites °• If your incisions open or pull apart °• Swelling or pain in calf (lower leg) °• Diarrhea (Loose bowel movements that happen often), frequent watery, uncontrolled bowel movements °• Constipation, (no bowel movements for 3 days) if this happens: °o Take Milk of Magnesia, 2 tablespoons by mouth, 3 times a day for 2 days if needed °o Stop taking Milk of Magnesia once you have had a bowel movement °o Call your doctor if constipation continues °Or °o Take Miralax  (instead of Milk of Magnesia) following the label instructions °o Stop taking Miralax once you have had a bowel movement °o Call your doctor if constipation continues °• Anything you think is “abnormal for you” °  °Normal side effects after surgery: • Unable  to sleep at night or unable to concentrate °• Irritability °• Being tearful (crying) or depressed ° °These are common complaints, possibly related to your anesthesia, stress of surgery, and change in lifestyle, that usually go away a few weeks after surgery. If these feelings continue, call your medical doctor.  °Wound Care: You may have surgical glue, steri-strips, or staples over your incisions after surgery °• Surgical glue: Looks like clear film over your incisions and will wear off a little at a time °• Steri-strips: Adhesive strips of tape over your incisions. You may notice a yellowish color on skin under the steri-strips. This is used to make the steri-strips stick better. Do not pull the steri-strips off - let them fall off °• Staples: Staples may be removed before you leave the hospital °o If you go home with staples, call [facility] for an appointment with your surgeon’s nurse to have staples removed 10 days after surgery, [phone] °• Showering: You may shower two (2) days after your surgery unless your surgeon tells you differently °o Wash gently around incisions with warm soapy water, rinse well, and gently pat dry °o If you have a drain (tube from your incision), you may need someone to hold this while you shower °o No tub baths until staples are removed and incisions are healed °  °Medications: • Medications should be liquid or crushed if larger than the size of a dime °• Extended release pills (medication that releases a little bit at   a time through the  day) should not be crushed °• Depending on the size and number of medications you take, you may need to space (take a few throughout the day)/change the time you take your medications so that you do not over-fill your pouch (smaller stomach) °• Make sure you follow-up with you primary care physician to make medication changes needed during rapid weight loss and life -style changes °• If you have diabetes, follow up with your  doctor that orders your diabetes medication(s) within one week after surgery and check your blood sugar regularly ° °• Do not drive while taking narcotics (pain medications) ° °• Do not take acetaminophen (Tylenol) and Roxicet or Lortab Elixir at the same time since these pain medications contain acetaminophen °  °Diet:  °First 2 Weeks You will see the nutritionist about two (2) weeks after your surgery. The nutritionist will increase the types of foods you can eat if you are handling liquids well: °• If you have severe vomiting or nausea and cannot handle clear liquids lasting longer than 1 day call your surgeon °Protein Shake °• Drink at least 2 ounces of shake 5-6 times per day °• Each serving of protein shakes (usually 8-12 ounces) should have a minimum of: °o 15 grams of protein °o And no more than 5 grams of carbohydrate °• Goal for protein each day: °o Men = 80 grams per day °o Women = 60 grams per day °  ° • Protein powder may be added to fluids such as non-fat milk or Lactaid milk or Soy milk (limit to 35 grams added protein powder per serving) ° °Hydration °• Slowly increase the amount of water and other clear liquids as tolerated (See Acceptable Fluids) °• Slowly increase the amount of protein shake as tolerated °• Sip fluids slowly and throughout the day °• May use sugar substitutes in small amounts (no more than 6-8 packets per day; i.e. Splenda) ° °Fluid Goal °• The first goal is to drink at least 8 ounces of protein shake/drink per day (or as directed by the nutritionist); some examples of protein shakes are Syntrax Nectar, Adkins Advantage, EAS Edge HP, and Unjury. - See handout from pre-op Bariatric Education Class: °o Slowly increase the amount of protein shake you drink as tolerated °o You may find it easier to slowly sip shakes throughout the day °o It is important to get your proteins in first °• Your fluid goal is to drink 64-100 ounces of fluid daily °o It may take a few weeks to build up to  this  °• 32 oz. (or more) should be clear liquids °And °• 32 oz. (or more) should be full liquids (see below for examples) °• Liquids should not contain sugar, caffeine, or carbonation ° °Clear Liquids: °• Water of Sugar-free flavored water (i.e. Fruit H²O, Propel) °• Decaffeinated coffee or tea (sugar-free) °• Crystal lite, Wyler’s Lite, Minute Maid Lite °• Sugar-free Jell-O °• Bouillon or broth °• Sugar-free Popsicle:    - Less than 20 calories each; Limit 1 per day ° °Full Liquids: °                  Protein Shakes/Drinks + 2 choices per day of other full liquids °• Full liquids must be: °o No More Than 12 grams of Carbs per serving °o No More Than 3 grams of Fat per serving °• Strained low-fat cream soup °• Non-Fat milk °• Fat-free Lactaid Milk °• Sugar-free yogurt (Dannon Lite & Fit, Greek   yogurt) ° °  °Vitamins and Minerals • Start 1 day after surgery unless otherwise directed by your surgeon °• 2 Chewable Multivitamin / Multimineral Supplement with iron (i.e. Centrum for Adults) °• Vitamin B-12, 350-500 micrograms sub-lingual (place tablet under the tongue) each day °• Chewable Calcium Citrate with Vitamin D-3 °(Example: 3 Chewable Calcium  Plus 600 with Vitamin D-3) °o Take 500 mg three (3) times a day for a total of 1500 mg each day °o Do not take all 3 doses of calcium at one time as it may cause constipation, and you can only absorb 500 mg at a time °o Do not mix multivitamins containing iron with calcium supplements;  take 2 hours apart °o Do not substitute Tums (calcium carbonate) for your calcium °• Menstruating women and those at risk for anemia ( a blood disease that causes weakness) may need extra iron °o Talk to your doctor to see if you need more iron °• If you need extra iron: Total daily Iron recommendation (including Vitamins) is 50 to 100 mg Iron/day °• Do not stop taking or change any vitamins or minerals until you talk to your nutritionist or surgeon °• Your nutritionist and/or surgeon must  approve all vitamin and mineral supplements °  °Activity and Exercise: It is important to continue walking at home. Limit your physical activity as instructed by your doctor. During this time, use these guidelines: °• Do not lift anything greater than ten  (10) pounds for at least two (2) weeks °• Do not go back to work or drive until your surgeon says you can °• You may have sex when you feel comfortable °o It is VERY important for female patients to use a reliable birth control method; fertility often increase after surgery °o Do not get pregnant for at least 18 months °• Start exercising as soon as your doctor tells you that you can °o Make sure your doctor approves any physical activity °• Start with a simple walking program °• Walk 5-15 minutes each day, 7 days per week °• Slowly increase until you are walking 30-45 minutes per day °• Consider joining our BELT program. [phone] or email [email or web address] °  °Special Instructions Things to remember: °• Free counseling is available for you and your family through collaboration between [facility] and [employer]. Please call [phone] and leave a message °• Use your CPAP when sleeping if this applies to you °• Consider buying a medical alert bracelet that says you had lap-band surgery °  °  You will likely have your first fill (fluid added to your band) 6 - 8 weeks after surgery °• [facility] has a free Bariatric Surgery Support Group that meets monthly, the 3rd Thursday, 6pm. [facility] Classrooms. You can see classes online at [email or web address] °• It is very important to keep all follow up appointments with your surgeon, nutritionist, primary care physician, and behavioral health practitioner °o After the first year, please follow up with your bariatric surgeon and nutritionist at least once a year in order to maintain best weight loss results °      °             [facility]:  [phone] ° °             [facility]: [phone] ° °             Bariatric Nurse Coordinator: [phone]  °Gastric Bypass/Sleeve Home Care   Instructions  Rev. 03/2012    ° °                                                    Reviewed and Endorsed °                                                   by [facility] Patient Education Committee, Jan, 2014 ° ° ° ° ° ° ° ° ° °

## 2015-11-12 NOTE — H&P (Signed)
History of Present Illness Marland Kitchen T. Maude Gloor MD; 11/01/2015 3:45 PM) Patient words: Pre op.  The patient is a 40 year old female who presents with obesity. She returns for her preoperative visit prior to planned laparoscopic sleeve gastrectomy for morbid obesity. She was referred by Darrol Jump PA for consideration for surgical treatment for morbid obesity. The patient gives a history of progressive obesity since adolescence despite multiple attempts at medical management. She has been through innumerable efforts at diet and exercise and several rounds of weight loss medications with some temporary modest weight loss and then rapid weight regain. Obesity has been affecting the patient in a number of ways including chronic back and leg pain that interferes with work and daily activities. She has no other significant comorbidities but diabetes is prevalent in her family and she is very concerned about her long-term health going forward at her current weight. After initial visit we elected to proceed with sleeve gastrectomy.  She is successfully completed her preoperative workup. No concerns on psych or nutrition evaluation. We reviewed lab work and chest x-ray which was unremarkable. Upper GI series questioned a small hiatal hernia and we discussed that we would look for this and repair it at the time of surgery if it were significant.   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 Medications Reconciled OxyCODONE HCl (5MG /5ML Solution, 5-10 Milliliter Oral every four hours, as needed, Taken starting 11/01/2015) Active. Protonix (40MG  Tablet DR, 1 (one) Tablet Oral daily, Taken starting 11/01/2015) Active. Zofran (4MG  Tablet, 1 (one) Tablet Oral every six hours, as needed, Taken starting 11/01/2015) Active.  Social History Edward Jolly, MD; 11/01/2015 3:48 PM) Tobacco use Never smoker. Alcohol use Occasional alcohol use. Caffeine use Carbonated beverages, Tea. No drug use  Family History Edward Jolly, MD; 11/01/2015 3:48 PM) Arthritis Father, Mother. Breast Cancer Family Members In General. Kidney Disease Daughter. Diabetes Mellitus Family Members In General, Father. Heart disease in female family member before age 50 Hypertension Family Members In General, Father, Mother.  Pregnancy / Birth History Edward Jolly, MD; 11/01/2015 3:48 PM) Age at menarche 63 years. Gravida 4 Irregular periods Maternal age 74-25 Para 6  Vitals Patsey Berthold CMA; 11/01/2015 3:03 PM) 11/01/2015 3:02 PM Weight: 305.8 lb Height: 64.5in Body Surface Area: 2.36 m Body Mass Index: 51.68 kg/m  Temp.: 97.2F  Pulse: 94 (Regular)  BP: 128/72 (Sitting, Left Arm, Large)       Physical Exam Marland Kitchen T. Tanice Petre MD; 11/01/2015 3:46 PM) The physical exam findings are as follows: Note:General: Alert, morbidly obese Caucasian female, in no distress Skin: Warm and dry without rash or infection. HEENT: No palpable masses or thyromegaly. Sclera nonicteric. Lungs: Breath sounds clear and equal. No wheezing or increased work of breathing. Cardiovascular: Regular rate and rhythm without murmer. No JVD or edema. Peripheral pulses intact. No carotid  bruits. Abdomen: Nondistended. Soft and nontender. No masses palpable.  No organomegaly. No palpable hernias. Extremities: No edema or joint swelling or deformity. No chronic venous stasis changes. Neurologic: Alert and fully oriented. Gait normal. No focal weakness. Psychiatric: Normal mood and affect. Thought content appropriate with normal judgement and insight    Assessment & Plan Marland Kitchen T. Noland Pizano MD; 11/01/2015 3:48 PM) MORBID OBESITY WITH BMI OF 50.0-59.9, ADULT (E66.01) Impression: Patient with progressive morbid obesity unresponsive to multiple efforts at medical management who presents with a BMI of 52 and comorbidities of chronic back and joint pain. I believe there would be very significant medical benefit from surgical weight loss. After our discussion of surgical options currently available the patient has decided to proceed with laparoscopic sleeve gastrectomy due to the reasons above. She has very mild reflux which I do not believe would be a contraindication. We have discussed the nature of the problem and the risks of remaining morbidly obese. We discussed laparoscopic sleeve gastrectomy in detail including the nature of the procedure, expected hospitalization and recovery, and major risks of anesthetic complications, bleeding, blood clots and pulmonary emboli, leakage and infection and long-term risks of stricture, , bowel obstruction, reflux, nutritional deficiencies, and failure to lose weight or weight regain. We discussed these problems could lead to death. We discussed that the procedure is not reversible. We discussed possible need for conversion to gastric bypass or other procedure. All her questions were answered. She is given prescription for pain medication as well as for Zofran and Protonix.  Current Plans Schedule for Surgery  Laparoscopic sleeve gastrectomy

## 2015-11-13 ENCOUNTER — Inpatient Hospital Stay (HOSPITAL_COMMUNITY): Payer: BC Managed Care – PPO

## 2015-11-13 LAB — CBC WITH DIFFERENTIAL/PLATELET
BASOS ABS: 0 10*3/uL (ref 0.0–0.1)
BASOS PCT: 0 %
EOS ABS: 0 10*3/uL (ref 0.0–0.7)
Eosinophils Relative: 0 %
HCT: 38.8 % (ref 36.0–46.0)
HEMOGLOBIN: 12.4 g/dL (ref 12.0–15.0)
Lymphocytes Relative: 13 %
Lymphs Abs: 2.1 10*3/uL (ref 0.7–4.0)
MCH: 28 pg (ref 26.0–34.0)
MCHC: 32 g/dL (ref 30.0–36.0)
MCV: 87.6 fL (ref 78.0–100.0)
MONO ABS: 1 10*3/uL (ref 0.1–1.0)
MONOS PCT: 6 %
NEUTROS PCT: 81 %
Neutro Abs: 13.1 10*3/uL — ABNORMAL HIGH (ref 1.7–7.7)
Platelets: 376 10*3/uL (ref 150–400)
RBC: 4.43 MIL/uL (ref 3.87–5.11)
RDW: 13.9 % (ref 11.5–15.5)
WBC: 16.2 10*3/uL — ABNORMAL HIGH (ref 4.0–10.5)

## 2015-11-13 LAB — HEMOGLOBIN AND HEMATOCRIT, BLOOD
HEMATOCRIT: 38.5 % (ref 36.0–46.0)
HEMOGLOBIN: 12.3 g/dL (ref 12.0–15.0)

## 2015-11-13 MED ORDER — IOPAMIDOL (ISOVUE-300) INJECTION 61%
50.0000 mL | Freq: Once | INTRAVENOUS | Status: AC | PRN
  Administered 2015-11-13: 50 mL via ORAL

## 2015-11-13 NOTE — Progress Notes (Signed)
Patient alert and oriented, Post op day 1.  Provided support and encouragement.  Encouraged pulmonary toilet, ambulation and small sips of liquids.  All questions answered.  Will continue to monitor. 

## 2015-11-13 NOTE — Progress Notes (Signed)
1 Day Post-Op  Subjective: "A lot of pain and nausea". Better after medications. Has been up walking. Describes pain in left mid abdomen and epigastrium.  Objective: Vital signs in last 24 hours: Temp:  [97.7 F (36.5 C)-98.7 F (37.1 C)] 98.2 F (36.8 C) (09/20 0538) Pulse Rate:  [54-91] 58 (09/20 0538) Resp:  [16-23] 16 (09/20 0538) BP: (95-140)/(55-85) 95/57 (09/20 0538) SpO2:  [94 %-100 %] 99 % (09/20 0538) Last BM Date: 11/11/15  Intake/Output from previous day: 09/19 0701 - 09/20 0700 In: 3371.7 [P.O.:75; I.V.:3196.7; IV Piggyback:100] Out: 1020 [Urine:1000; Blood:20] Intake/Output this shift: No intake/output data recorded.  General appearance: alert, cooperative and mild distress Resp: clear to auscultation bilaterally GI: Mild/moderate tenderness in epigastrium without guarding.  Lab Results:   Recent Labs  11/12/15 1912 11/13/15 0335  WBC  --  16.2*  HGB 13.1 12.4  HCT 40.1 38.8  PLT  --  376   BMET No results for input(s): NA, K, CL, CO2, GLUCOSE, BUN, CREATININE, CALCIUM in the last 72 hours.   Studies/Results: No results found.  Anti-infectives: Anti-infectives    Start     Dose/Rate Route Frequency Ordered Stop   11/12/15 0601  levofloxacin (LEVAQUIN) IVPB 750 mg     750 mg 100 mL/hr over 90 Minutes Intravenous On call to O.R. 11/12/15 0601 11/12/15 0728      Assessment/Plan: s/p Procedure(s): LAPAROSCOPIC GASTRIC SLEEVE RESECTION WITH HIATAL HERNIA REPAIR WITH UPPER ENDO UPPER GI ENDOSCOPY Somewhat more pain than typical. Leukocytosis, not unusual for postoperative day 1. However with this combination plan to check Gastrografin swallow this morning. Not ready for discharge today. The patient does not meet criteria for discharge because she has uncontrolled pain and is requiring intravenous medications.     LOS: 1 day    Adine Heimann T 9/20/2017Patient ID: Mary Williams, female   DOB: [birth year], 40 y.o.   MRN: [id number]

## 2015-11-14 LAB — CBC WITH DIFFERENTIAL/PLATELET
Basophils Absolute: 0 10*3/uL (ref 0.0–0.1)
Basophils Relative: 0 %
EOS ABS: 0.1 10*3/uL (ref 0.0–0.7)
EOS PCT: 1 %
HCT: 36.7 % (ref 36.0–46.0)
Hemoglobin: 11.9 g/dL — ABNORMAL LOW (ref 12.0–15.0)
LYMPHS ABS: 3.9 10*3/uL (ref 0.7–4.0)
LYMPHS PCT: 31 %
MCH: 29 pg (ref 26.0–34.0)
MCHC: 32.4 g/dL (ref 30.0–36.0)
MCV: 89.3 fL (ref 78.0–100.0)
MONO ABS: 1 10*3/uL (ref 0.1–1.0)
Monocytes Relative: 8 %
Neutro Abs: 7.5 10*3/uL (ref 1.7–7.7)
Neutrophils Relative %: 60 %
PLATELETS: 312 10*3/uL (ref 150–400)
RBC: 4.11 MIL/uL (ref 3.87–5.11)
RDW: 14.4 % (ref 11.5–15.5)
WBC: 12.6 10*3/uL — AB (ref 4.0–10.5)

## 2015-11-14 NOTE — Progress Notes (Signed)
2 Days Post-Op  Subjective: Still with pain in epigastrium and left upper quadrant. Some nausea last night but better this morning. Tolerating water and some protein shakes.  Objective: Vital signs in last 24 hours: Temp:  [98 F (36.7 C)-99.3 F (37.4 C)] 98.3 F (36.8 C) (09/21 0626) Pulse Rate:  [60-61] 61 (09/21 0626) Resp:  [17-18] 18 (09/21 0626) BP: (111-121)/(69-84) 120/71 (09/21 0626) SpO2:  [92 %-100 %] 92 % (09/21 0626) Last BM Date: 11/11/15  Intake/Output from previous day: 09/20 0701 - 09/21 0700 In: 1841.7 [P.O.:290; I.V.:1501.7; IV Piggyback:50] Out: 450 [Urine:450] Intake/Output this shift: No intake/output data recorded.  General appearance: alert, cooperative and Appears more comfortable than yesterday Resp: No wheezing or increased work of breathing GI: Mild epigastric and left upper quadrant tenderness which seems to be incisional Incision/Wound: Clean and dry  Lab Results:   Recent Labs  11/13/15 0335 11/13/15 1527 11/14/15 0418  WBC 16.2*  --  12.6*  HGB 12.4 12.3 11.9*  HCT 38.8 38.5 36.7  PLT 376  --  312   BMET No results for input(s): NA, K, CL, CO2, GLUCOSE, BUN, CREATININE, CALCIUM in the last 72 hours.   Studies/Results: Dg Ugi W/water Sol Cm  Result Date: 11/13/2015 CLINICAL DATA:  Status post gastric sleeve and hiatal hernia EXAM: WATER SOLUBLE UPPER GI SERIES TECHNIQUE: Single-column upper GI series was performed using water soluble contrast. CONTRAST:  50 cc water-soluble contrast COMPARISON:  7/20/ 17 FLUOROSCOPY TIME:  Radiation Exposure Index (if provided by the fluoroscopic device): 63.1 FINDINGS: The esophagus is normal in caliber patent. Intact fundoplication wrap. Postoperative appearance of the stomach compatible with sleeve gastrectomy. No extravasation of contrast material identified to suggest leak. Prompt emptying of the gastric lumen noted. IMPRESSION: 1. No complications identified status post sleeve gastrectomy.  Electronically Signed   By: Kerby Moors M.D.   On: 11/13/2015 10:36    Anti-infectives: Anti-infectives    Start     Dose/Rate Route Frequency Ordered Stop   11/12/15 0601  levofloxacin (LEVAQUIN) IVPB 750 mg     750 mg 100 mL/hr over 90 Minutes Intravenous On call to O.R. 11/12/15 0601 11/12/15 0728      Assessment/Plan: s/p Procedure(s): LAPAROSCOPIC GASTRIC SLEEVE RESECTION WITH HIATAL HERNIA REPAIR WITH UPPER ENDO UPPER GI ENDOSCOPY Seems improved today. Swallow study negative. Leukocytosis resolving. Will continue protein shakes and ambulate this morning and should be able to go home later today.   LOS: 2 days    Atiya Yera T 9/21/2017Patient ID: Mary Williams, female   DOB: [birth year], 40 y.o.   MRN: [id number]

## 2015-11-14 NOTE — Progress Notes (Signed)
Mary Williams called me to say discharge cancelled by Dr Redmond Pulling because pt continues to be  nauseated and not drinking well enough  yet.  Patient will be transferred to [address].Report called to Manus Gunning RN

## 2015-11-14 NOTE — Plan of Care (Signed)
Problem: Food- and Nutrition-Related Knowledge Deficit (NB-1.1) Goal: Nutrition education Formal process to instruct or train a patient/client in a skill or to impart knowledge to help patients/clients voluntarily manage or modify food choices and eating behavior to maintain or improve health. Outcome: Completed/Met Date Met: 11/14/15 Nutrition Education Note  Received consult for diet education per DROP protocol.   Discussed 2 week post op diet with pt. Emphasized that liquids must be non carbonated, non caffeinated, and sugar free. Fluid goals discussed. Reviewed progression of diet to include soft proteins at 7-10 days post-op. Pt to follow up with outpatient bariatric RD for further diet progression after 2 weeks. Multivitamins and minerals also reviewed. Teach back method used, pt expressed understanding, expect good compliance.   Diet: First 2 Weeks  You will see the dietitian about two (2) weeks after your surgery. The dietitian will increase the types of foods you can eat if you are handling liquids well:  If you have severe vomiting or nausea and cannot handle clear liquids lasting longer than 1 day, call your surgeon  Protein Shake  Drink at least 2 ounces of shake 5-6 times per day  Each serving of protein shakes (usually 8 - 12 ounces) should have a minimum of:  15 grams of protein  And no more than 5 grams of carbohydrate  Goal for protein each day:  Men = 80 grams per day  Women = 60 grams per day  Protein powder may be added to fluids such as non-fat milk or Lactaid milk or Soy milk (limit to 35 grams added protein powder per serving)   Hydration  Slowly increase the amount of water and other clear liquids as tolerated (See Acceptable Fluids)  Slowly increase the amount of protein shake as tolerated  Sip fluids slowly and throughout the day  May use sugar substitutes in small amounts (no more than 6 - 8 packets per day; i.e. Splenda)   Fluid Goal  The first goal is to  drink at least 8 ounces of protein shake/drink per day (or as directed by the nutritionist); some examples of protein shakes are [employer], [employer], EAS Edge HP, and Unjury. See handout from pre-op Bariatric Education Class:  Slowly increase the amount of protein shake you drink as tolerated  You may find it easier to slowly sip shakes throughout the day  It is important to get your proteins in first  Your fluid goal is to drink 64 - 100 ounces of fluid daily  It may take a few weeks to build up to this  32 oz (or more) should be clear liquids  And  32 oz (or more) should be full liquids (see below for examples)  Liquids should not contain sugar, caffeine, or carbonation   Clear Liquids:  Water or Sugar-free flavored water (i.e. Fruit H2O, Propel)  Decaffeinated coffee or tea (sugar-free)  Crystal Lite, Wyler's Lite, Minute Maid Lite  Sugar-free Jell-O  Bouillon or broth  Sugar-free Popsicle: *Less than 20 calories each; Limit 1 per day   Full Liquids:  Protein Shakes/Drinks + 2 choices per day of other full liquids  Full liquids must be:  No More Than 12 grams of Carbs per serving  No More Than 3 grams of Fat per serving  Strained low-fat cream soup  Non-Fat milk  Fat-free Lactaid Milk  Sugar-free yogurt (Dannon Lite & Fit, Greek yogurt)     Clayton Bibles, MS, RD, LDN Pager: [phone] After Hours Pager: [phone]

## 2015-11-14 NOTE — Discharge Summary (Signed)
  Patient ID: MOMOKA STANSFIELD [id number] 40 y.o. Jun 20, 1975  11/12/2015  Discharge date and time: 11/14/2015   Admitting Physician: Excell Seltzer T  Discharge Physician: Excell Seltzer T  Admission Diagnoses: MORBID OBESITY  Discharge Diagnoses: Same  Operations: Procedure(s): LAPAROSCOPIC GASTRIC SLEEVE RESECTION WITH HIATAL HERNIA REPAIR WITH UPPER ENDO UPPER GI ENDOSCOPY  Admission Condition: good  Discharged Condition: good  Indication for Admission: Patient is a 40 year old female with progressive morbid obesity unresponsive to medical management who presents at a BMI of 52 with comorbidities of chronic back and joint pain. After extensive preoperative discussion and workup detailed elsewhere she is electively admitted for laparoscopic sleeve gastrectomy for treatment of her morbid obesity.  Hospital Course: On the morning of admission the patient underwent an uneventful laparoscopic sleeve gastrectomy. She had a small but definite hiatal hernia that was also repaired. On the first postoperative day she was having quite a bit of upper abdominal pain and nausea. Vital signs were stable. She had a moderate leukocytosis. We proceeded with Gastrografin upper GI series which was negative for leak or obstruction. She was started on water and ice chips which she tolerated and began protein shakes that evening. On the day of discharge she is still having moderate pain but controlled with medications. Leukocytosis is resolving. Nausea is resolved. Abdomen shows only mild incisional tenderness. She is felt ready for discharge.  Significant Diagnostic Studies: radiology: Gastrografin upper GI postop day 1 negative   Disposition: Home  Patient Instructions:    Medication List    TAKE these medications   escitalopram 20 MG tablet Commonly known as:  LEXAPRO Take 20 mg by mouth at bedtime.   FLINTSTONES COMPLETE PO Take 1 tablet by mouth daily.       Activity: activity as  tolerated Diet: Bariatric protein shakes Wound Care: none needed  Follow-up:  With Dr. Excell Seltzer in 3 weeks.  Signed: Edward Jolly MD, FACS  11/14/2015, 8:08 AM

## 2015-11-15 NOTE — Progress Notes (Signed)
Patient alert and oriented, pain is controlled. Patient is tolerating fluids, advanced to protein shake yesterday, patient had difficulty with nausea.  Today patient nausea has resolved and patient is tolerating well.  Reviewed Gastric sleeve discharge instructions with patient and patient is able to articulate understanding.  Provided information on BELT program, Support Group and [facility] outpatient pharmacy. All questions answered, will continue to monitor.

## 2015-11-15 NOTE — Progress Notes (Signed)
Discharge instructions discussed with patient, verbalized agreement and understanding 

## 2015-11-15 NOTE — Progress Notes (Signed)
3 Days Post-Op  Subjective: Gradually less pain and nausea.  Feels ready to go home tokday.  Objective: Vital signs in last 24 hours: Temp:  [98.4 F (36.9 C)-99.8 F (37.7 C)] 98.4 F (36.9 C) (09/22 0509) Pulse Rate:  [64-79] 64 (09/22 0509) Resp:  [11-18] 16 (09/22 0509) BP: (110-125)/(62-83) 114/73 (09/22 0509) SpO2:  [94 %-100 %] 99 % (09/22 0509) Last BM Date: 11/11/15  Intake/Output from previous day: 09/21 0701 - 09/22 0700 In: 2480 [P.O.:330; I.V.:2000; IV Piggyback:150] Out: 1500 [Urine:1500] Intake/Output this shift: No intake/output data recorded.  General appearance: alert, cooperative and no distress GI: Minimal apprpriate tenderness Incision/Wound: Clean and dry  Lab Results:   Recent Labs  11/13/15 0335 11/13/15 1527 11/14/15 0418  WBC 16.2*  --  12.6*  HGB 12.4 12.3 11.9*  HCT 38.8 38.5 36.7  PLT 376  --  312   BMET No results for input(s): NA, K, CL, CO2, GLUCOSE, BUN, CREATININE, CALCIUM in the last 72 hours.   Studies/Results: Dg Ugi W/water Sol Cm  Result Date: 11/13/2015 CLINICAL DATA:  Status post gastric sleeve and hiatal hernia EXAM: WATER SOLUBLE UPPER GI SERIES TECHNIQUE: Single-column upper GI series was performed using water soluble contrast. CONTRAST:  50 cc water-soluble contrast COMPARISON:  7/20/ 17 FLUOROSCOPY TIME:  Radiation Exposure Index (if provided by the fluoroscopic device): 63.1 FINDINGS: The esophagus is normal in caliber patent. Intact fundoplication wrap. Postoperative appearance of the stomach compatible with sleeve gastrectomy. No extravasation of contrast material identified to suggest leak. Prompt emptying of the gastric lumen noted. IMPRESSION: 1. No complications identified status post sleeve gastrectomy. Electronically Signed   By: Kerby Moors M.D.   On: 11/13/2015 10:36    Anti-infectives: Anti-infectives    Start     Dose/Rate Route Frequency Ordered Stop   11/12/15 0601  levofloxacin (LEVAQUIN) IVPB 750 mg      750 mg 100 mL/hr over 90 Minutes Intravenous On call to O.R. 11/12/15 0601 11/12/15 0728      Assessment/Plan: s/p Procedure(s): LAPAROSCOPIC GASTRIC SLEEVE RESECTION WITH HIATAL HERNIA REPAIR WITH UPPER ENDO UPPER GI ENDOSCOPY Improving, no apparent complication OK for discharge   LOS: 3 days    Taira Knabe T 9/22/2017Patient ID: Mary Williams, female   DOB: [birth year], 40 y.o.   MRN: [id number]

## 2015-11-26 ENCOUNTER — Encounter: Payer: BC Managed Care – PPO | Attending: General Surgery | Admitting: Dietician

## 2015-11-26 DIAGNOSIS — Z6841 Body Mass Index (BMI) 40.0 and over, adult: Secondary | ICD-10-CM | POA: Insufficient documentation

## 2015-11-27 NOTE — Progress Notes (Signed)
Bariatric Class:  Appt start time: 1530 end time:  1630.  2 Week Post-Operative Nutrition Class  Patient was seen on 11/26/2015 for Post-Operative Nutrition education at [facility].   Surgery date: 11/12/2015 Surgery type: Sleeve gastrectomy Start weight at [facility]: 307 lbs on 09/23/2015, 310.8 lbs on 10/29/15 Weight today: 285.2 lbs  Weight change: 25.6 lbs  TANITA  BODY COMP RESULTS  10/29/15 11/27/15   BMI (kg/m^2) 53.3 49.0   Fat Mass (lbs) 167.8 149.4   Fat Free Mass (lbs) 143 135.8   Total Body Water (lbs) 107.2 101.2   The following the learning objectives were met by the patient during this course:  Identifies Phase 3A (Soft, High Proteins) Dietary Goals and will begin from 2 weeks post-operatively to 2 months post-operatively  Identifies appropriate sources of fluids and proteins   States protein recommendations and appropriate sources post-operatively  Identifies the need for appropriate texture modifications, mastication, and bite sizes when consuming solids  Identifies appropriate multivitamin and calcium sources post-operatively  Describes the need for physical activity post-operatively and will follow MD recommendations  States when to call healthcare provider regarding medication questions or post-operative complications  Handouts given during class include:  Phase 3A: Soft, High Protein Diet Handout  Follow-Up Plan: Patient will follow-up at [facility] in 6 weeks for 2 month post-op nutrition visit for diet advancement per MD.

## 2015-12-26 ENCOUNTER — Encounter: Payer: BC Managed Care – PPO | Attending: General Surgery | Admitting: Skilled Nursing Facility1

## 2015-12-26 ENCOUNTER — Encounter: Payer: Self-pay | Admitting: Skilled Nursing Facility1

## 2015-12-26 DIAGNOSIS — Z6841 Body Mass Index (BMI) 40.0 and over, adult: Secondary | ICD-10-CM | POA: Diagnosis not present

## 2015-12-26 NOTE — Progress Notes (Signed)
  Follow-up visit:  8 Weeks Post-Operative Sleeve Gastrectomy Surgery  Medical Nutrition Therapy:  Appt start time: 2:32 end time:  3:20  Primary concerns today: Post-operative Bariatric Surgery Nutrition Management.  Pt states sheTried a granola bar and made her nauseous and tried cheese from top of pizza which caused nauseous. Pt states she has tried green beans, zucchini, squash with no issue. Pt states she was never doing protein shakes because they made her sick.  TANITA  BODY COMP RESULTS  10/29/15 11/27/15 12/26/2015   BMI (kg/m^2) 53.3 49.0 47.2   Fat Mass (lbs) 167.8 149.4 136.4   Fat Free Mass (lbs) 143 135.8 138.6   Total Body Water (lbs) 107.2 101.2 102.6    Preferred Learning Style:  No preference indicated   Learning Readiness:   Change in progress  24-hr recall: B (AM): egg casserole: [place] sausage, cheese, milk, seasonings (1 maybe 2 ounces) Snk (2 hours later AM): string cheese L (PM): lunch meat and cheese------leftovers (pork tenderloin with cheese)---greek yogurt Snk (PM): yogurt or cheese stick D (PM): chicken or pork---turkey meat Snk (PM): none *meals outside the home: 2 Fluid intake: crystal light, un sweet tea, caffinated coffee (unable to determine: definitely getting at least 80 ounces) Estimated total protein intake: 66 grams  Medications: No change Supplementation: Will start celebrate multivitamin and is currently taking 3 calcium a day  Using straws: Yes Drinking while eating: No Hair loss: no Carbonated beverages: No N/V/D/C: only with sugar consumption  Recent physical activity:  Walking every other day and will start walking every day 45 minutes a day  Progress Towards Goal(s):  In progress.  Handouts given during visit include:  Phase 3B   Nutritional Diagnosis:  [place]-3.3 Overweight/obesity related to past poor dietary habits and physical inactivity as evidenced by patient w/ recent Sleeve Gastrectomy surgery following dietary  guidelines for continued weight loss.     Intervention:  Nutrition counseling for obesity. Dietitian educated the pt on diet advancement 8 weeks after surgery.   Teaching Method Utilized: Visual Auditory Hands on  Barriers to learning/adherence to lifestyle change: possibly wanting banana/carbohydrate containing foods  Demonstrated degree of understanding via:  Teach Back   Monitoring/Evaluation:  Dietary intake, exercise, lap band fills, and body weight. Follow up in 2 months for 4 month post-op visit.

## 2015-12-26 NOTE — Patient Instructions (Signed)
.  ndmb

## 2016-01-03 ENCOUNTER — Ambulatory Visit: Payer: Self-pay | Admitting: Skilled Nursing Facility1

## 2016-01-31 ENCOUNTER — Other Ambulatory Visit: Payer: Self-pay | Admitting: General Surgery

## 2016-01-31 DIAGNOSIS — R1011 Right upper quadrant pain: Secondary | ICD-10-CM

## 2016-02-21 ENCOUNTER — Encounter: Payer: BC Managed Care – PPO | Attending: General Surgery | Admitting: Skilled Nursing Facility1

## 2016-02-21 ENCOUNTER — Encounter: Payer: Self-pay | Admitting: Skilled Nursing Facility1

## 2016-02-21 DIAGNOSIS — Z6841 Body Mass Index (BMI) 40.0 and over, adult: Secondary | ICD-10-CM | POA: Diagnosis not present

## 2016-02-21 NOTE — Patient Instructions (Addendum)
-  Try the capsule multivitamin   -Drop your leftover supplements by the support group  -Any calcium with 500 mg per serving (1500mg  a day)  -When your at [employer] try the small chili   -Consistently and Always have vegetables with lunch and dinner  -Always chew 30 times per bite and take small bites  -Keep listening to your body  -Spinach, fat free cows milk, 1 scoop protein, half a banana or 1/4 of cup melon (try consuming 4 ounces at a time) (you can do greek yogurt and milk as your protein)  -Fluid and protein are the most important: so always eat your protein first and complete then your vegetables then anything else

## 2016-02-21 NOTE — Progress Notes (Signed)
Follow-up visit:  8 Weeks Post-Operative Sleeve Gastrectomy Surgery  Medical Nutrition Therapy:  Appt start time: 2:32 end time:  3:20  Primary concerns today: Post-operative Bariatric Surgery Nutrition Management.  Pt states she has been doing the flinestone multi and did not feel they gave her enough umph now taking a one a day. But tried the bariatric ones and she did not like them. Pt states her daughter is in the hospital for an asthma attack. Pt states she tried not greek Yogurt and a piece of cookie that caused nausea. Pt states she wants to add fruit even though it was explained this might slow down her wt loss. Pt states she ate half a [place] bar with toleration. Pt states she has a lot of left over multivitamins that she will take to support group to see if anyone wants them.   TANITA  BODY COMP RESULTS  10/29/15 11/27/15 12/26/2015 02/21/2016   BMI (kg/m^2) 53.3 49.0 47.2 44.2   Fat Mass (lbs) 167.8 149.4 136.4 128   Fat Free Mass (lbs) 143 135.8 138.6 129.4   Total Body Water (lbs) 107.2 101.2 102.6 95.4    Preferred Learning Style:  No preference indicated   Learning Readiness:   Change in progress  Goals: -Try the capsule multivitamin   -Drop your leftover supplements by the support group  -Any calcium with 500 mg per serving (1500mg  a day)  -When your at [employer] try the small chili   -Consistently and Always have vegetables with lunch and dinner  -Always chew 30 times per bite and take small bites  -Keep listening to your body  -Spinach, fat free cows milk, 1 scoop protein, half a banana or 1/4 of cup melon (try consuming 4 ounces at a time) (you can do greek yogurt and milk as your protein)  -Fluid and protein are the most important: so always eat your protein first and complete then your vegetables then anything else  24-hr recall: B (AM): coffee with protein powder (20 grams) and creamer and yogurt (12g protein) OR 1-2 eggs (7-14) Snk (2 hours  later AM): yogurt OR cheese stick OR nuts and cheese and dried fruit (12-7 grams) L (PM): chicken-----pork with vegetable (broccoli/squash) (3 ounces-21 grams protein) Snk (PM): yogurt OR cheese stick OR baked cheese (7grams) D (PM): Meat and vegetable (21 protein) Snk (PM): yogurt OR cheesestick *meals outside the home: 2: getting a burger with no bun  Fluid intake: 10 ounces caffinated coffee, crystal light (64 ounces), un sweet tea (16 ounces) Estimated total protein intake: 88 grams  Medications: No change Supplementation: one a day Using straws: Yes Drinking while eating: NO Hair loss: Yes, more than usual  Carbonated beverages: NO N/V/D/C: only with sugar consumption, NO, NO, NO  Recent physical activity:  Walking 5 days a week 30 minutes a day  Progress Towards Goal(s):  In progress.  Handouts given during visit include:  Phase 3B   Nutritional Diagnosis:  [place]-3.3 Overweight/obesity related to past poor dietary habits and physical inactivity as evidenced by patient w/ recent Sleeve Gastrectomy surgery following dietary guidelines for continued weight loss.     Intervention:  Nutrition counseling for obesity. Dietitian educated the pt on diet advancement 8 weeks after surgery.   Teaching Method Utilized: Visual Auditory Hands on  Barriers to learning/adherence to lifestyle change: possibly wanting banana/carbohydrate containing foods  Demonstrated degree of understanding via:  Teach Back   Monitoring/Evaluation:  Dietary intake, exercise, lap band fills, and body weight. Follow  up in 2 months for 4 month post-op visit.

## 2016-05-28 ENCOUNTER — Encounter: Payer: Self-pay | Admitting: Skilled Nursing Facility1

## 2016-05-28 ENCOUNTER — Encounter: Payer: BC Managed Care – PPO | Attending: General Surgery | Admitting: Skilled Nursing Facility1

## 2016-05-28 DIAGNOSIS — Z029 Encounter for administrative examinations, unspecified: Secondary | ICD-10-CM | POA: Insufficient documentation

## 2016-05-28 DIAGNOSIS — Z6841 Body Mass Index (BMI) 40.0 and over, adult: Secondary | ICD-10-CM

## 2016-05-28 NOTE — Progress Notes (Signed)
  Follow-up visit:  8 Weeks Post-Operative Sleeve Gastrectomy Surgery  Medical Nutrition Therapy:  Appt start time: 2:32 end time:  3:20  Primary concerns today: Post-operative Bariatric Surgery Nutrition Management.  Pt states he is back on protonix due to bad reflux symptoms; Pt states fruits give her the worst symptoms. Pt states he has been craving water.  Pt states her sweet tooth is kicking in. Pt states sugary foods causes constipation and nausea. Was weighing every day until she realized it was causing negative feelings. Pt states she wants to be 200 pounds and then 180 pounds.   11/27/2015: 285 pounds Today: 240.12.8 pounds  TANITA  BODY COMP RESULTS  10/29/15 11/27/15 12/26/2015 02/21/2016 05/28/2016   BMI (kg/m^2) 53.3 49.0 47.2 44.2 41.3   Fat Mass (lbs) 167.8 149.4 136.4 128 108.4   Fat Free Mass (lbs) 143 135.8 138.6 129.4 132.4   Total Body Water (lbs) 107.2 101.2 102.6 95.4 96.8    Preferred Learning Style:  No preference indicated   Learning Readiness:   Change in progress  Goals: Just take tums -Opurity Complete Bariatric Optimized Multivitamin Multimineral Supplement - Capsule (90-day Supply)  (google that) -Keep aware of your body and what she is telling you and what you are snacking on -Try 8.8pH water Alkaline  -Keep on working on loving yourself and knowing that guilt gets you nowhere  24-hr recall: B (AM): coffee with protein powder (20 grams) and creamer and yogurt (12g protein) OR 1-2 eggs (7-14) Snk (2 hours later AM): yogurt OR cheese stick OR nuts and cheese and dried fruit (12-7 grams) L (PM): chicken-----pork with vegetable (broccoli/squash) (3 ounces-21 grams protein) Snk (PM): yogurt OR cheese stick OR baked cheese (7grams) D (PM): Meat and vegetable (21 protein) Snk (PM): yogurt OR cheesestick *meals outside the home: 2: getting a burger with no bun  Fluid intake: 20 ounces caffinated coffee with protein powder, crystal light (64 ounces), un  sweet tea (16 ounces) Estimated total protein intake: 88 grams  Medications: No change Supplementation: calcium chews causing nausea Using straws: Yes Drinking while eating: NO Hair loss: some but no issues  Carbonated beverages: NO N/V/D/C: only diarrhea with caffinated beverages/protein shake in the coffee   Recent physical activity:  Walking 5 days a week 30 minutes a day  Progress Towards Goal(s):  In progress.  Handouts given during visit include:  Phase 3B   Nutritional Diagnosis:  [place]-3.3 Overweight/obesity related to past poor dietary habits and physical inactivity as evidenced by patient w/ recent Sleeve Gastrectomy surgery following dietary guidelines for continued weight loss.     Intervention:  Nutrition counseling for obesity. Dietitian educated the pt on diet advancement 8 weeks after surgery.   Teaching Method Utilized: Visual Auditory Hands on  Barriers to learning/adherence to lifestyle change: possibly wanting banana/carbohydrate containing foods  Demonstrated degree of understanding via:  Teach Back   Monitoring/Evaluation:  Dietary intake, exercise, lap band fills, and body weight. Follow up in 2 months for 4 month post-op visit.

## 2016-05-28 NOTE — Patient Instructions (Addendum)
-  Just take tums  -Opurity Complete Bariatric Optimized Multivitamin Multimineral Supplement - Capsule (90-day Supply)  (google that)  -Keep aware of your body and what she is telling you and what you are snacking on  -Try 8.8pH water Alkaline    -Keep on working on loving yourself and knowing that guilt gets you nowhere

## 2016-05-29 ENCOUNTER — Ambulatory Visit: Payer: Self-pay | Admitting: Skilled Nursing Facility1

## 2016-07-26 ENCOUNTER — Emergency Department (HOSPITAL_COMMUNITY): Payer: BC Managed Care – PPO

## 2016-07-26 ENCOUNTER — Encounter (HOSPITAL_COMMUNITY): Payer: Self-pay | Admitting: Emergency Medicine

## 2016-07-26 ENCOUNTER — Inpatient Hospital Stay (HOSPITAL_COMMUNITY)
Admission: EM | Admit: 2016-07-26 | Discharge: 2016-08-01 | DRG: 749 | Disposition: A | Discharge status: Home / Self Care | Payer: BC Managed Care – PPO | Attending: General Surgery | Admitting: General Surgery

## 2016-07-26 DIAGNOSIS — K388 Other specified diseases of appendix: Secondary | ICD-10-CM | POA: Diagnosis present

## 2016-07-26 DIAGNOSIS — K567 Ileus, unspecified: Secondary | ICD-10-CM | POA: Diagnosis present

## 2016-07-26 DIAGNOSIS — N83202 Unspecified ovarian cyst, left side: Secondary | ICD-10-CM | POA: Diagnosis not present

## 2016-07-26 DIAGNOSIS — Z880 Allergy status to penicillin: Secondary | ICD-10-CM

## 2016-07-26 DIAGNOSIS — Z9884 Bariatric surgery status: Secondary | ICD-10-CM

## 2016-07-26 DIAGNOSIS — K219 Gastro-esophageal reflux disease without esophagitis: Secondary | ICD-10-CM | POA: Diagnosis present

## 2016-07-26 DIAGNOSIS — Z91013 Allergy to seafood: Secondary | ICD-10-CM

## 2016-07-26 DIAGNOSIS — R109 Unspecified abdominal pain: Secondary | ICD-10-CM | POA: Diagnosis not present

## 2016-07-26 DIAGNOSIS — R1031 Right lower quadrant pain: Secondary | ICD-10-CM

## 2016-07-26 DIAGNOSIS — K660 Peritoneal adhesions (postprocedural) (postinfection): Secondary | ICD-10-CM | POA: Diagnosis present

## 2016-07-26 DIAGNOSIS — Z6841 Body Mass Index (BMI) 40.0 and over, adult: Secondary | ICD-10-CM

## 2016-07-26 DIAGNOSIS — Z881 Allergy status to other antibiotic agents status: Secondary | ICD-10-CM

## 2016-07-26 HISTORY — DX: Gastro-esophageal reflux disease without esophagitis: K21.9

## 2016-07-26 LAB — URINALYSIS, ROUTINE W REFLEX MICROSCOPIC
Bilirubin Urine: NEGATIVE
Glucose, UA: NEGATIVE mg/dL
Hgb urine dipstick: NEGATIVE
Ketones, ur: NEGATIVE mg/dL
Leukocytes, UA: NEGATIVE
NITRITE: NEGATIVE
PH: 5 (ref 5.0–8.0)
Protein, ur: NEGATIVE mg/dL
Specific Gravity, Urine: 1.019 (ref 1.005–1.030)

## 2016-07-26 LAB — COMPREHENSIVE METABOLIC PANEL
ALBUMIN: 4.1 g/dL (ref 3.5–5.0)
ALK PHOS: 60 U/L (ref 38–126)
ALT: 17 U/L (ref 14–54)
ANION GAP: 6 (ref 5–15)
AST: 22 U/L (ref 15–41)
BUN: 14 mg/dL (ref 6–20)
CALCIUM: 9.1 mg/dL (ref 8.9–10.3)
CO2: 27 mmol/L (ref 22–32)
Chloride: 105 mmol/L (ref 101–111)
Creatinine, Ser: 0.74 mg/dL (ref 0.44–1.00)
GFR calc non Af Amer: >60 mL/min (ref >60)
GLUCOSE: 95 mg/dL (ref 65–99)
POTASSIUM: 3.9 mmol/L (ref 3.5–5.1)
SODIUM: 138 mmol/L (ref 135–145)
TOTAL PROTEIN: 7 g/dL (ref 6.5–8.1)
Total Bilirubin: 0.5 mg/dL (ref 0.3–1.2)

## 2016-07-26 LAB — CBC
HEMATOCRIT: 42.8 % (ref 36.0–46.0)
HEMOGLOBIN: 13.7 g/dL (ref 12.0–15.0)
MCH: 27.7 pg (ref 26.0–34.0)
MCHC: 32 g/dL (ref 30.0–36.0)
MCV: 86.6 fL (ref 78.0–100.0)
Platelets: 370 10*3/uL (ref 150–400)
RBC: 4.94 MIL/uL (ref 3.87–5.11)
RDW: 13.6 % (ref 11.5–15.5)
WBC: 12 10*3/uL — ABNORMAL HIGH (ref 4.0–10.5)

## 2016-07-26 LAB — LIPASE, BLOOD: Lipase: 33 U/L (ref 11–51)

## 2016-07-26 LAB — POC URINE PREG, ED: PREG TEST UR: NEGATIVE

## 2016-07-26 MED ORDER — SODIUM CHLORIDE 0.9 % IV BOLUS (SEPSIS)
1000.0000 mL | Freq: Once | INTRAVENOUS | Status: AC
  Administered 2016-07-26: 1000 mL via INTRAVENOUS

## 2016-07-26 MED ORDER — IOPAMIDOL (ISOVUE-300) INJECTION 61%
INTRAVENOUS | Status: AC
  Administered 2016-07-26: 100 mL
  Filled 2016-07-26: qty 100

## 2016-07-26 MED ORDER — MORPHINE SULFATE (PF) 4 MG/ML IV SOLN
6.0000 mg | Freq: Once | INTRAVENOUS | Status: AC
  Administered 2016-07-26: 6 mg via INTRAVENOUS
  Filled 2016-07-26: qty 2

## 2016-07-26 MED ORDER — FENTANYL CITRATE (PF) 100 MCG/2ML IJ SOLN: 50.0000 ug | INTRAMUSCULAR | Status: DC | PRN

## 2016-07-26 MED ORDER — ONDANSETRON HCL 4 MG/2ML IJ SOLN
4.0000 mg | Freq: Once | INTRAMUSCULAR | Status: AC
  Administered 2016-07-26: 4 mg via INTRAVENOUS
  Filled 2016-07-26: qty 2

## 2016-07-26 MED ORDER — HYDROMORPHONE HCL 1 MG/ML IJ SOLN
1.0000 mg | Freq: Once | INTRAMUSCULAR | Status: AC
  Administered 2016-07-26: 1 mg via INTRAVENOUS
  Filled 2016-07-26: qty 1

## 2016-07-26 NOTE — ED Notes (Signed)
Patient transported to Ultrasound 

## 2016-07-26 NOTE — ED Provider Notes (Signed)
[facility] DEPT Provider Note   CSN: [id number] Arrival date & time: 07/26/16  1933     History   Chief Complaint Chief Complaint  Patient presents with  . Abdominal Pain    HPI Mary Williams is a 41 y.o. female.  Patient with gastric sleeve placement in September, hysterectomy with ovaries remaining, hiatal hernia repair presents with severe sudden onset right lower quadrant abdominal pain. No history of similar. Nausea and no vomiting. No history of bowel obstruction. Patient is nonsmoker. Patient has a history of a pelvic abscess and ectopic pregnancy.      Past Medical History:  Diagnosis Date  . Anxiety   . GERD (gastroesophageal reflux disease)   . History of hiatal hernia     Patient Active Problem List   Diagnosis Date Noted  . Abdominal pain 07/27/2016  . Morbid obesity with BMI of 50.0-59.9, adult ([facility]) 11/12/2015    Past Surgical History:  Procedure Laterality Date  . ABDOMINAL HYSTERECTOMY     ovaries left  . DILATION AND CURETTAGE OF UTERUS    . HERNIA REPAIR    . LAPAROSCOPIC GASTRIC SLEEVE RESECTION WITH HIATAL HERNIA REPAIR N/A 11/12/2015   Procedure: LAPAROSCOPIC GASTRIC SLEEVE RESECTION WITH HIATAL HERNIA REPAIR WITH UPPER ENDO;  Surgeon: Excell Seltzer, MD;  Location: [facility];  Service: General;  Laterality: N/A;  . UPPER GI ENDOSCOPY  11/12/2015   Procedure: UPPER GI ENDOSCOPY;  Surgeon: Excell Seltzer, MD;  Location: [facility];  Service: General;;    OB History    No data available       Home Medications    Prior to Admission medications   Medication Sig Start Date End Date Taking? Authorizing Provider  escitalopram (LEXAPRO) 20 MG tablet Take 20 mg by mouth at bedtime.    Yes [provider]  pantoprazole (PROTONIX) 40 MG tablet Take 40 mg by mouth every evening.  07/20/16  Yes [provider]    Family History Family History  Problem Relation Age of Onset  . Hypertension Other   . Asthma Other   . Cancer  Other   . Diabetes Other     Social History Social History  Substance Use Topics  . Smoking status: Never Smoker  . Smokeless tobacco: Never Used  . Alcohol use No     Allergies   Fish allergy; Penicillins; Shellfish allergy; and Ceclor [cefaclor]   Review of Systems Review of Systems  Constitutional: Positive for appetite change. Negative for chills and fever.  HENT: Negative for congestion.   Eyes: Negative for visual disturbance.  Respiratory: Negative for shortness of breath.   Cardiovascular: Negative for chest pain.  Gastrointestinal: Positive for abdominal pain and vomiting.  Genitourinary: Negative for dysuria and flank pain.  Musculoskeletal: Negative for back pain, neck pain and neck stiffness.  Skin: Negative for rash.  Neurological: Negative for light-headedness and headaches.     Physical Exam Updated Vital Signs BP 115/60 (BP Location: Left Arm)   Pulse 65   Temp 98.6 F (37 C) (Oral)   Resp 17   Ht 5\' 4"  (1.626 m)   Wt 109.8 kg (242 lb 1.6 oz)   SpO2 100%   BMI 41.56 kg/m   Physical Exam  Constitutional: She is oriented to person, place, and time. She appears well-developed and well-nourished.  HENT:  Head: Normocephalic and atraumatic.  Eyes: Conjunctivae are normal. Right eye exhibits no discharge. Left eye exhibits no discharge.  Neck: Normal range of motion. Neck supple. No  tracheal deviation present.  Cardiovascular: Normal rate and regular rhythm.   Pulmonary/Chest: Effort normal and breath sounds normal.  Abdominal: Soft. She exhibits no distension. There is tenderness. There is guarding.  Patient has significant tenderness right lower quadrant with guarding, mild fullness appreciated.  Musculoskeletal: She exhibits no edema.  Neurological: She is alert and oriented to person, place, and time.  Skin: Skin is warm. No rash noted.  Psychiatric: She has a normal mood and affect.  Nursing note and vitals reviewed.    ED Treatments /  Results  Labs (all labs ordered are listed, but only abnormal results are displayed) Labs Reviewed  CBC - Abnormal; Notable for the following:       Result Value   WBC 12.0 (*)    All other components within normal limits  CBC - Abnormal; Notable for the following:    WBC 12.2 (*)    All other components within normal limits  BASIC METABOLIC PANEL - Abnormal; Notable for the following:    Calcium 8.6 (*)    All other components within normal limits  CBC WITH DIFFERENTIAL/PLATELET - Abnormal; Notable for the following:    Hemoglobin 11.9 (*)    All other components within normal limits  BASIC METABOLIC PANEL - Abnormal; Notable for the following:    Calcium 8.4 (*)    All other components within normal limits  LIPASE, BLOOD  COMPREHENSIVE METABOLIC PANEL  URINALYSIS, ROUTINE W REFLEX MICROSCOPIC  HIV ANTIBODY (ROUTINE TESTING)  CBC  POC URINE PREG, ED    EKG  EKG Interpretation None       Radiology No results found.  Procedures Procedures (including critical care time)  Medications Ordered in ED Medications  pantoprazole (PROTONIX) EC tablet 40 mg (40 mg Oral Given 07/28/16 1738)  escitalopram (LEXAPRO) tablet 20 mg (20 mg Oral Given 07/28/16 2210)  enoxaparin (LOVENOX) injection 40 mg (40 mg Subcutaneous Given 07/28/16 1151)  0.9 %  sodium chloride infusion ( Intravenous New Bag/Given 07/28/16 2327)  oxyCODONE (Oxy IR/ROXICODONE) immediate release tablet 5-10 mg (10 mg Oral Given 07/28/16 2000)  ondansetron (ZOFRAN-ODT) disintegrating tablet 4 mg ( Oral See Alternative 07/27/16 1801)    Or  ondansetron (ZOFRAN) injection 4 mg (4 mg Intravenous Given 07/27/16 1801)  diphenhydrAMINE (BENADRYL) capsule 25 mg (not administered)    Or  diphenhydrAMINE (BENADRYL) injection 25 mg (not administered)  simethicone (MYLICON) chewable tablet 40 mg (not administered)  ciprofloxacin (CIPRO) IVPB 400 mg (400 mg Intravenous New Bag/Given 07/28/16 2327)  morphine 4 MG/ML injection 2-4 mg (4 mg  Intravenous Given 07/28/16 2210)  methocarbamol (ROBAXIN) 500 mg in dextrose 5 % 50 mL IVPB (not administered)  promethazine (PHENERGAN) injection 12.5 mg (not administered)  morphine 4 MG/ML injection 6 mg (6 mg Intravenous Given 07/26/16 2025)  sodium chloride 0.9 % bolus 1,000 mL (0 mLs Intravenous Stopped 07/26/16 2224)  iopamidol (ISOVUE-300) 61 % injection (100 mLs  Contrast Given 07/26/16 2104)  HYDROmorphone (DILAUDID) injection 1 mg (1 mg Intravenous Given 07/26/16 2140)  ondansetron (ZOFRAN) injection 4 mg (4 mg Intravenous Given 07/26/16 2140)  ketorolac (TORADOL) 30 MG/ML injection 30 mg (30 mg Intravenous Given 07/28/16 1738)  magnesium citrate solution 1 Bottle (1 Bottle Oral Given 07/27/16 0159)     Initial Impression / Assessment and Plan / ED Course  I have reviewed the triage vital signs and the nursing notes.  Pertinent labs & imaging results that were available during my care of the patient were reviewed by me and  considered in my medical decision making (see chart for details).    Patient presents for severe abdominal pain sudden onset, with concerning differential diagnosis called CT scan to immediately bring patient over. Pain meds repeated nausea meds given. Nothing by mouth.  CT scan unable to visualize appendix. Ultrasound no acute findings. Discussed with surgery and plan for observation the hospital.  Final Clinical Impressions(s) / ED Diagnoses   Final diagnoses:  Right lower quadrant abdominal pain    New Prescriptions Current Discharge Medication List       Elnora Morrison, MD 07/29/16 [phone]

## 2016-07-26 NOTE — ED Triage Notes (Signed)
Pt c/o RLQ abd pain onset this am, was intermittent now constant, +nausea, worse with movement. Denies urinary s/s, denies fever, denies diarrhea. Pt tearful in triage

## 2016-07-27 ENCOUNTER — Encounter (HOSPITAL_COMMUNITY): Payer: Self-pay | Admitting: General Practice

## 2016-07-27 DIAGNOSIS — R109 Unspecified abdominal pain: Secondary | ICD-10-CM | POA: Diagnosis present

## 2016-07-27 DIAGNOSIS — Z880 Allergy status to penicillin: Secondary | ICD-10-CM | POA: Diagnosis not present

## 2016-07-27 DIAGNOSIS — Z91013 Allergy to seafood: Secondary | ICD-10-CM | POA: Diagnosis not present

## 2016-07-27 DIAGNOSIS — Z881 Allergy status to other antibiotic agents status: Secondary | ICD-10-CM | POA: Diagnosis not present

## 2016-07-27 DIAGNOSIS — K388 Other specified diseases of appendix: Secondary | ICD-10-CM | POA: Diagnosis present

## 2016-07-27 DIAGNOSIS — K660 Peritoneal adhesions (postprocedural) (postinfection): Secondary | ICD-10-CM | POA: Diagnosis present

## 2016-07-27 DIAGNOSIS — K219 Gastro-esophageal reflux disease without esophagitis: Secondary | ICD-10-CM | POA: Diagnosis present

## 2016-07-27 DIAGNOSIS — Z9884 Bariatric surgery status: Secondary | ICD-10-CM | POA: Diagnosis not present

## 2016-07-27 DIAGNOSIS — N83202 Unspecified ovarian cyst, left side: Secondary | ICD-10-CM | POA: Diagnosis present

## 2016-07-27 DIAGNOSIS — K567 Ileus, unspecified: Secondary | ICD-10-CM | POA: Diagnosis present

## 2016-07-27 DIAGNOSIS — Z6841 Body Mass Index (BMI) 40.0 and over, adult: Secondary | ICD-10-CM | POA: Diagnosis not present

## 2016-07-27 LAB — BASIC METABOLIC PANEL
Anion gap: 7 (ref 5–15)
BUN: 11 mg/dL (ref 6–20)
CO2: 24 mmol/L (ref 22–32)
CREATININE: 0.67 mg/dL (ref 0.44–1.00)
Calcium: 8.6 mg/dL — ABNORMAL LOW (ref 8.9–10.3)
Chloride: 107 mmol/L (ref 101–111)
GFR calc Af Amer: >60 mL/min (ref >60)
Glucose, Bld: 97 mg/dL (ref 65–99)
POTASSIUM: 3.8 mmol/L (ref 3.5–5.1)
SODIUM: 138 mmol/L (ref 135–145)

## 2016-07-27 LAB — HIV ANTIBODY (ROUTINE TESTING W REFLEX): HIV Screen 4th Generation wRfx: NONREACTIVE

## 2016-07-27 LAB — CBC
HCT: 38.7 % (ref 36.0–46.0)
Hemoglobin: 12.5 g/dL (ref 12.0–15.0)
MCH: 27.8 pg (ref 26.0–34.0)
MCHC: 32.3 g/dL (ref 30.0–36.0)
MCV: 86 fL (ref 78.0–100.0)
PLATELETS: 300 10*3/uL (ref 150–400)
RBC: 4.5 MIL/uL (ref 3.87–5.11)
RDW: 13.5 % (ref 11.5–15.5)
WBC: 12.2 10*3/uL — ABNORMAL HIGH (ref 4.0–10.5)

## 2016-07-27 MED ORDER — PROMETHAZINE HCL 25 MG/ML IJ SOLN
12.5000 mg | Freq: Four times a day (QID) | INTRAMUSCULAR | Status: DC | PRN
  Administered 2016-07-30 – 2016-07-31 (×3): 12.5 mg via INTRAVENOUS
  Filled 2016-07-27 (×3): qty 1

## 2016-07-27 MED ORDER — SIMETHICONE 80 MG PO CHEW
40.0000 mg | CHEWABLE_TABLET | Freq: Four times a day (QID) | ORAL | Status: DC | PRN
  Administered 2016-07-31: 40 mg via ORAL
  Filled 2016-07-27 (×2): qty 1

## 2016-07-27 MED ORDER — KETOROLAC TROMETHAMINE 30 MG/ML IJ SOLN
30.0000 mg | Freq: Four times a day (QID) | INTRAMUSCULAR | Status: AC
  Administered 2016-07-27 – 2016-07-28 (×8): 30 mg via INTRAVENOUS
  Filled 2016-07-27 (×9): qty 1

## 2016-07-27 MED ORDER — MORPHINE SULFATE (PF) 4 MG/ML IV SOLN
2.0000 mg | INTRAVENOUS | Status: DC | PRN
  Administered 2016-07-27: 4 mg via INTRAVENOUS
  Administered 2016-07-27: 2 mg via INTRAVENOUS
  Administered 2016-07-28 – 2016-07-29 (×5): 4 mg via INTRAVENOUS
  Filled 2016-07-27 (×6): qty 1

## 2016-07-27 MED ORDER — METHOCARBAMOL 1000 MG/10ML IJ SOLN
500.0000 mg | Freq: Three times a day (TID) | INTRAVENOUS | Status: DC | PRN
  Filled 2016-07-27 (×2): qty 5

## 2016-07-27 MED ORDER — MAGNESIUM CITRATE PO SOLN
1.0000 | Freq: Once | ORAL | Status: AC
  Administered 2016-07-27: 1 via ORAL
  Filled 2016-07-27: qty 296

## 2016-07-27 MED ORDER — ESCITALOPRAM OXALATE 20 MG PO TABS
20.0000 mg | ORAL_TABLET | Freq: Every day | ORAL | Status: DC
  Administered 2016-07-27 – 2016-07-31 (×6): 20 mg via ORAL
  Filled 2016-07-27 (×6): qty 1

## 2016-07-27 MED ORDER — ENOXAPARIN SODIUM 40 MG/0.4ML ~~LOC~~ SOLN
40.0000 mg | SUBCUTANEOUS | Status: DC
  Administered 2016-07-27 – 2016-08-01 (×5): 40 mg via SUBCUTANEOUS
  Filled 2016-07-27 (×5): qty 0.4

## 2016-07-27 MED ORDER — DIPHENHYDRAMINE HCL 50 MG/ML IJ SOLN: 25.0000 mg | Freq: Four times a day (QID) | INTRAMUSCULAR | Status: DC | PRN

## 2016-07-27 MED ORDER — SODIUM CHLORIDE 0.9 % IV SOLN
INTRAVENOUS | Status: DC
  Administered 2016-07-27 – 2016-07-31 (×4): via INTRAVENOUS

## 2016-07-27 MED ORDER — PANTOPRAZOLE SODIUM 40 MG PO TBEC
40.0000 mg | DELAYED_RELEASE_TABLET | Freq: Every evening | ORAL | Status: DC
  Administered 2016-07-27 – 2016-07-31 (×5): 40 mg via ORAL
  Filled 2016-07-27 (×5): qty 1

## 2016-07-27 MED ORDER — MORPHINE SULFATE (PF) 4 MG/ML IV SOLN
2.0000 mg | INTRAVENOUS | Status: DC | PRN
  Administered 2016-07-27 (×3): 2 mg via INTRAVENOUS
  Filled 2016-07-27 (×4): qty 1

## 2016-07-27 MED ORDER — OXYCODONE HCL 5 MG PO TABS
5.0000 mg | ORAL_TABLET | ORAL | Status: DC | PRN
  Administered 2016-07-27 – 2016-07-30 (×11): 10 mg via ORAL
  Filled 2016-07-27 (×11): qty 2

## 2016-07-27 MED ORDER — CIPROFLOXACIN IN D5W 400 MG/200ML IV SOLN
400.0000 mg | Freq: Two times a day (BID) | INTRAVENOUS | Status: DC
  Administered 2016-07-27 – 2016-07-30 (×8): 400 mg via INTRAVENOUS
  Filled 2016-07-27 (×9): qty 200

## 2016-07-27 MED ORDER — ONDANSETRON HCL 4 MG/2ML IJ SOLN
4.0000 mg | Freq: Four times a day (QID) | INTRAMUSCULAR | Status: DC | PRN
  Administered 2016-07-27 – 2016-07-30 (×6): 4 mg via INTRAVENOUS
  Filled 2016-07-27 (×8): qty 2

## 2016-07-27 MED ORDER — DIPHENHYDRAMINE HCL 25 MG PO CAPS: 25.0000 mg | ORAL_CAPSULE | Freq: Four times a day (QID) | ORAL | Status: DC | PRN

## 2016-07-27 MED ORDER — ONDANSETRON 4 MG PO TBDP: 4.0000 mg | ORAL_TABLET | Freq: Four times a day (QID) | ORAL | Status: DC | PRN

## 2016-07-27 NOTE — H&P (Signed)
Reason for Consult: abdominal pain  Mary Williams is an 41 y.o. female.  HPI: 41yo female with 1 day history of abdominal pain. The pain began in the morning while working in her [employer]. It is stabbing in nature and has been slowly increasing in intensity in the last 6 hours. She had 2 normal bowel movements today. She has nausea and dry heaving but no vomiting. She denies fevers.  She had sleeve in 10/2015 with 80lb weight loss, she does not have frequent nausea or abdominal pains, and is able to tolerate most types of foods and continues on a low carb diet.  Past Medical History:  Diagnosis Date  . Anxiety   . History of hiatal hernia     Past Surgical History:  Procedure Laterality Date  . ABDOMINAL HYSTERECTOMY     ovaries left  . LAPAROSCOPIC GASTRIC SLEEVE RESECTION WITH HIATAL HERNIA REPAIR N/A 11/12/2015   Procedure: LAPAROSCOPIC GASTRIC SLEEVE RESECTION WITH HIATAL HERNIA REPAIR WITH UPPER ENDO;  Surgeon: Excell Seltzer, MD;  Location: [facility];  Service: General;  Laterality: N/A;  . UPPER GI ENDOSCOPY  11/12/2015   Procedure: UPPER GI ENDOSCOPY;  Surgeon: Excell Seltzer, MD;  Location: [facility];  Service: General;;    Family History  Problem Relation Age of Onset  . Hypertension Other   . Asthma Other   . Cancer Other   . Diabetes Other     Social History:  reports that she has never smoked. She has never used smokeless tobacco. She reports that she does not drink alcohol or use drugs.  Allergies:  Allergies  Allergen Reactions  . Fish Allergy Anaphylaxis  . Penicillins Anaphylaxis    Has patient had a PCN reaction causing immediate rash, facial/tongue/throat swelling, SOB or lightheadedness with hypotension: Yes Has patient had a PCN reaction causing severe rash involving mucus membranes or skin necrosis: No Has patient had a PCN reaction that required hospitalization No Has patient had a PCN reaction occurring within the last 10 years: No If all of  the above answers are "NO", then may proceed with Cephalosporin use.   . Shellfish Allergy Anaphylaxis  . Ceclor [Cefaclor] Hives    Medications: I have reviewed the patient's current medications.  Results for orders placed or performed during the hospital encounter of 07/26/16 (from the past 48 hour(s))  Lipase, blood     Status: None   Collection Time: 07/26/16  7:42 PM  Result Value Ref Range   Lipase 33 11 - 51 U/L  Comprehensive metabolic panel     Status: None   Collection Time: 07/26/16  7:42 PM  Result Value Ref Range   Sodium 138 135 - 145 mmol/L   Potassium 3.9 3.5 - 5.1 mmol/L   Chloride 105 101 - 111 mmol/L   CO2 27 22 - 32 mmol/L   Glucose, Bld 95 65 - 99 mg/dL   BUN 14 6 - 20 mg/dL   Creatinine, Ser 0.74 0.44 - 1.00 mg/dL   Calcium 9.1 8.9 - 10.3 mg/dL   Total Protein 7.0 6.5 - 8.1 g/dL   Albumin 4.1 3.5 - 5.0 g/dL   AST 22 15 - 41 U/L   ALT 17 14 - 54 U/L   Alkaline Phosphatase 60 38 - 126 U/L   Total Bilirubin 0.5 0.3 - 1.2 mg/dL   GFR calc non Af Amer >60 >60 mL/min   GFR calc Af Amer >60 >60 mL/min    Comment: (NOTE) The eGFR has  been calculated using the CKD EPI equation. This calculation has not been validated in all clinical situations. eGFR's persistently <60 mL/min signify possible Chronic Kidney Disease.    Anion gap 6 5 - 15  CBC     Status: Abnormal   Collection Time: 07/26/16  7:42 PM  Result Value Ref Range   WBC 12.0 (H) 4.0 - 10.5 K/uL   RBC 4.94 3.87 - 5.11 MIL/uL   Hemoglobin 13.7 12.0 - 15.0 g/dL   HCT 42.8 36.0 - 46.0 %   MCV 86.6 78.0 - 100.0 fL   MCH 27.7 26.0 - 34.0 pg   MCHC 32.0 30.0 - 36.0 g/dL   RDW 13.6 11.5 - 15.5 %   Platelets 370 150 - 400 K/uL  Urinalysis, Routine w reflex microscopic     Status: None   Collection Time: 07/26/16  7:52 PM  Result Value Ref Range   Color, Urine YELLOW YELLOW   APPearance CLEAR CLEAR   Specific Gravity, Urine 1.019 1.005 - 1.030   pH 5.0 5.0 - 8.0   Glucose, UA NEGATIVE NEGATIVE  mg/dL   Hgb urine dipstick NEGATIVE NEGATIVE   Bilirubin Urine NEGATIVE NEGATIVE   Ketones, ur NEGATIVE NEGATIVE mg/dL   Protein, ur NEGATIVE NEGATIVE mg/dL   Nitrite NEGATIVE NEGATIVE   Leukocytes, UA NEGATIVE NEGATIVE  POC urine preg, ED     Status: None   Collection Time: 07/26/16  8:03 PM  Result Value Ref Range   Preg Test, Ur NEGATIVE NEGATIVE    Comment:        THE SENSITIVITY OF THIS METHODOLOGY IS >24 mIU/mL     US Transvaginal Non-ob  Addendum Date: 07/26/2016   ADDENDUM REPORT: 07/26/2016 22:52 ADDENDUM: Repeat imaging was performed following the patient's CT study which identified both ovaries high within the pelvis. The ovaries are visualized and appear normal in size with the right measuring approximately 3.1 x 2.3 x 2.1 cm and the left measuring 3.1 x 2.5 x 3 cm. Arterial and venous waveforms are demonstrated within both ovaries. No torsion is noted. Electronically Signed   By: Ashley Royalty M.D.   On: 07/26/2016 22:52   Result Date: 07/26/2016 CLINICAL DATA:  Right lower quadrant pain EXAM: TRANSABDOMINAL AND TRANSVAGINAL ULTRASOUND OF PELVIS DOPPLER ULTRASOUND OF OVARIES TECHNIQUE: Both transabdominal and transvaginal ultrasound examinations of the pelvis were performed. Transabdominal technique was performed for global imaging of the pelvis including uterus, ovaries, adnexal regions, and pelvic cul-de-sac. It was necessary to proceed with endovaginal exam following the transabdominal exam to visualize the adnexa. Color and duplex Doppler ultrasound was utilized to evaluate blood flow to the ovaries. COMPARISON:  11/02/2011 CT FINDINGS: Uterus Surgically absent Endometrium Not applicable. Right ovary Not visualized Left ovary Not visualized Other findings Trace free fluid likely physiologic. Technologist notes the patient could not tolerate pressure from either form of imaging. IMPRESSION: 1. Status post hysterectomy. 2. Neither ovary was visualized. No adnexal mass however is seen.  Trace free fluid. Electronically Signed: By: Ashley Royalty M.D. On: 07/26/2016 21:12   US Pelvis Complete  Addendum Date: 07/26/2016   ADDENDUM REPORT: 07/26/2016 22:52 ADDENDUM: Repeat imaging was performed following the patient's CT study which identified both ovaries high within the pelvis. The ovaries are visualized and appear normal in size with the right measuring approximately 3.1 x 2.3 x 2.1 cm and the left measuring 3.1 x 2.5 x 3 cm. Arterial and venous waveforms are demonstrated within both ovaries. No torsion is noted. Electronically Signed  By: Ashley Royalty M.D.   On: 07/26/2016 22:52   Result Date: 07/26/2016 CLINICAL DATA:  Right lower quadrant pain EXAM: TRANSABDOMINAL AND TRANSVAGINAL ULTRASOUND OF PELVIS DOPPLER ULTRASOUND OF OVARIES TECHNIQUE: Both transabdominal and transvaginal ultrasound examinations of the pelvis were performed. Transabdominal technique was performed for global imaging of the pelvis including uterus, ovaries, adnexal regions, and pelvic cul-de-sac. It was necessary to proceed with endovaginal exam following the transabdominal exam to visualize the adnexa. Color and duplex Doppler ultrasound was utilized to evaluate blood flow to the ovaries. COMPARISON:  11/02/2011 CT FINDINGS: Uterus Surgically absent Endometrium Not applicable. Right ovary Not visualized Left ovary Not visualized Other findings Trace free fluid likely physiologic. Technologist notes the patient could not tolerate pressure from either form of imaging. IMPRESSION: 1. Status post hysterectomy. 2. Neither ovary was visualized. No adnexal mass however is seen. Trace free fluid. Electronically Signed: By: Ashley Royalty M.D. On: 07/26/2016 21:12   Ct Abdomen Pelvis W Contrast  Result Date: 07/26/2016 CLINICAL DATA:  Patient with right lower quadrant abdominal pain, worse with movement. EXAM: CT ABDOMEN AND PELVIS WITH CONTRAST TECHNIQUE: Multidetector CT imaging of the abdomen and pelvis was performed using the  standard protocol following bolus administration of intravenous contrast. CONTRAST:  143m ISOVUE-300 IOPAMIDOL (ISOVUE-300) INJECTION 61% COMPARISON:  Ultrasound pelvis 07/26/2016 FINDINGS: Lower chest: Normal heart size. Lung bases are clear. No pleural effusion. Hepatobiliary: Liver is normal in size and contour. Gallbladder is unremarkable. Pancreas: Unremarkable Spleen: Unremarkable Adrenals/Urinary Tract: Adrenal glands are normal. Kidneys enhance symmetrically with contrast. No hydronephrosis. Stomach/Bowel: No abnormal bowel wall thickening or evidence for bowel obstruction. No free fluid or free intraperitoneal air. Postsurgical changes involving the stomach. Vascular/Lymphatic: Normal caliber abdominal aorta. No retroperitoneal lymphadenopathy. Reproductive: Status post hysterectomy. The ovaries are demonstrated high within the pelvis. The ovaries are grossly unremarkable in appearance. Probable follicle left ovary. Other: None. Musculoskeletal: Lumbar spine degenerative changes. No aggressive or acute appearing osseous lesions. Probable small bone island within the left ilium (image 74; series 3). IMPRESSION: No acute process within the abdomen or pelvis. The appendix is not visualized however there is no significant right lower quadrant inflammatory change. Electronically Signed   By: DLovey NewcomerM.D.   On: 07/26/2016 21:31   [place]Art/ven Flow Abd Pelv Doppler  Addendum Date: 07/26/2016   ADDENDUM REPORT: 07/26/2016 22:52 ADDENDUM: Repeat imaging was performed following the patient's CT study which identified both ovaries high within the pelvis. The ovaries are visualized and appear normal in size with the right measuring approximately 3.1 x 2.3 x 2.1 cm and the left measuring 3.1 x 2.5 x 3 cm. Arterial and venous waveforms are demonstrated within both ovaries. No torsion is noted. Electronically Signed   By: DAshley RoyaltyM.D.   On: 07/26/2016 22:52   Result Date: 07/26/2016 CLINICAL DATA:  Right lower  quadrant pain EXAM: TRANSABDOMINAL AND TRANSVAGINAL ULTRASOUND OF PELVIS DOPPLER ULTRASOUND OF OVARIES TECHNIQUE: Both transabdominal and transvaginal ultrasound examinations of the pelvis were performed. Transabdominal technique was performed for global imaging of the pelvis including uterus, ovaries, adnexal regions, and pelvic cul-de-sac. It was necessary to proceed with endovaginal exam following the transabdominal exam to visualize the adnexa. Color and duplex Doppler ultrasound was utilized to evaluate blood flow to the ovaries. COMPARISON:  11/02/2011 CT FINDINGS: Uterus Surgically absent Endometrium Not applicable. Right ovary Not visualized Left ovary Not visualized Other findings Trace free fluid likely physiologic. Technologist notes the patient could not tolerate pressure from either form  of imaging. IMPRESSION: 1. Status post hysterectomy. 2. Neither ovary was visualized. No adnexal mass however is seen. Trace free fluid. Electronically Signed: By: Ashley Royalty M.D. On: 07/26/2016 21:12    Review of Systems  Constitutional: Positive for weight loss. Negative for chills and fever.  HENT: Negative for hearing loss.   Eyes: Negative for blurred vision and double vision.  Respiratory: Negative for cough and hemoptysis.   Cardiovascular: Negative for chest pain and palpitations.  Gastrointestinal: Positive for abdominal pain and nausea. Negative for constipation, diarrhea and vomiting.  Genitourinary: Negative for dysuria and urgency.  Musculoskeletal: Negative for myalgias and neck pain.  Skin: Negative for itching and rash.  Neurological: Negative for dizziness, tingling and headaches.  Endo/Heme/Allergies: Does not bruise/bleed easily.  Psychiatric/Behavioral: Negative for depression and suicidal ideas.   Blood pressure 111/67, pulse 64, temperature 98.3 F (36.8 C), temperature source Oral, resp. rate 17, height _0  (1.626 m), weight 106.1 kg (234 lb), SpO2 94 %. Physical Exam    Vitals reviewed. Constitutional: She is oriented to person, place, and time. She appears well-developed and well-nourished.  HENT:  Head: Normocephalic and atraumatic.  Eyes: Conjunctivae and EOM are normal. Pupils are equal, round, and reactive to light.  Neck: Normal range of motion. Neck supple.  Cardiovascular: Normal rate and regular rhythm.   Respiratory: Effort normal and breath sounds normal.  GI: Soft. Bowel sounds are normal. She exhibits no distension. There is tenderness in the right lower quadrant. There is no rebound and no guarding.  Musculoskeletal: Normal range of motion.  Neurological: She is alert and oriented to person, place, and time.  Skin: Skin is warm and dry.  Psychiatric: She has a normal mood and affect. Her behavior is normal.      Assessment/Plan: 41 yo female very uncomfortable with right lower quadrant tenderness. CT shows no appendix and no fluid in the right lower quadrant. Her white count is borderline at 12. Could be early appendicitis, enteritis, constipation. -bring in for observation -toradol today -magnesium citrate given CT showing moderate stool burden -IV fluids -recheck labs in am  Arta Bruce Jeliyah Middlebrooks 07/27/2016, 12:13 AM

## 2016-07-27 NOTE — Progress Notes (Signed)
Still has a lot of abdominal pain.  WBC 12.2, slightly up.  Hypoactive bowel sounds.  I have reviewed the CT scan of the abdomen and do not see anything that would explain her pain outside of some adnexal changes.  Empirically started her on Cipro.  Mary Williams. Dahlia Bailiff, MD, [place] [phone] [facility]

## 2016-07-27 NOTE — Care Management Note (Signed)
Case Management Note  Patient Details  Name: Mary Williams MRN: [id number] Date of Birth: [birth year]  Subjective/Objective:                    Action/Plan:   Expected Discharge Date:                  Expected Discharge Plan:  Home/Self Care  In-House Referral:     Discharge planning Services     Post Acute Care Choice:    Choice offered to:     DME Arranged:    DME Agency:     HH Arranged:    HH Agency:     Status of Service:  In process, will continue to follow  If discussed at Long Length of Stay Meetings, dates discussed:    Additional Comments:  Marilu Favre, RN 07/27/2016, 10:09 AM

## 2016-07-28 LAB — CBC WITH DIFFERENTIAL/PLATELET
Basophils Absolute: 0 10*3/uL (ref 0.0–0.1)
Basophils Relative: 0 %
EOS ABS: 0.2 10*3/uL (ref 0.0–0.7)
Eosinophils Relative: 3 %
HEMATOCRIT: 38.4 % (ref 36.0–46.0)
HEMOGLOBIN: 11.9 g/dL — AB (ref 12.0–15.0)
LYMPHS ABS: 2.6 10*3/uL (ref 0.7–4.0)
LYMPHS PCT: 29 %
MCH: 27.5 pg (ref 26.0–34.0)
MCHC: 31 g/dL (ref 30.0–36.0)
MCV: 88.7 fL (ref 78.0–100.0)
MONOS PCT: 9 %
Monocytes Absolute: 0.8 10*3/uL (ref 0.1–1.0)
NEUTROS ABS: 5.3 10*3/uL (ref 1.7–7.7)
NEUTROS PCT: 59 %
Platelets: 296 10*3/uL (ref 150–400)
RBC: 4.33 MIL/uL (ref 3.87–5.11)
RDW: 13.6 % (ref 11.5–15.5)
WBC: 9 10*3/uL (ref 4.0–10.5)

## 2016-07-28 LAB — BASIC METABOLIC PANEL
Anion gap: 9 (ref 5–15)
BUN: 12 mg/dL (ref 6–20)
CHLORIDE: 103 mmol/L (ref 101–111)
CO2: 27 mmol/L (ref 22–32)
CREATININE: 0.86 mg/dL (ref 0.44–1.00)
Calcium: 8.4 mg/dL — ABNORMAL LOW (ref 8.9–10.3)
GFR calc Af Amer: >60 mL/min (ref >60)
GFR calc non Af Amer: >60 mL/min (ref >60)
Glucose, Bld: 86 mg/dL (ref 65–99)
Potassium: 4.2 mmol/L (ref 3.5–5.1)
Sodium: 139 mmol/L (ref 135–145)

## 2016-07-28 NOTE — Progress Notes (Signed)
Patient ID: Mary Williams, female   DOB: Jul 26, 1975, 40 y.o.   MRN: [id number]  [facility] Progress Note     Subjective: CC- abdominal pain Patient states that she has persistent RLQ abdominal pain. The pain is constantly on a moderate level, and intermittently severe. Pain medication does help. She does not feel like she has made any improvement. Passing flatus. Last BM 6/3.  Objective: Vital signs in last 24 hours: Temp:  [97.9 F (36.6 C)-98.1 F (36.7 C)] 97.9 F (36.6 C) (06/05 0500) Pulse Rate:  [56-62] 62 (06/05 0500) Resp:  [18] 18 (06/05 0500) BP: (97-102)/(50-64) 102/64 (06/05 0500) SpO2:  [99 %-100 %] 100 % (06/05 0500) Last BM Date: 07/26/16  Intake/Output from previous day: 06/04 0701 - 06/05 0700 In: 3060 [P.O.:760; I.V.:2200; IV Piggyback:100] Out: 850 [Urine:850] Intake/Output this shift: No intake/output data recorded.  PE: Gen:  Alert, NAD, pleasant Card:  RRR, no M/G/R heard Pulm:  CTAB, no W/R/R, effort normal Abd: Soft, ND, +BS, no HSM, no hernia, moderate RLQ tenderness. No rebound or guarding. Ext:  No erythema, edema, or tenderness BUE/BLE   Lab Results:   Recent Labs  07/27/16 0049 07/28/16 0513  WBC 12.2* 9.0  HGB 12.5 11.9*  HCT 38.7 38.4  PLT 300 296   BMET  Recent Labs  07/27/16 0049 07/28/16 0513  NA 138 139  K 3.8 4.2  CL 107 103  CO2 24 27  GLUCOSE 97 86  BUN 11 12  CREATININE 0.67 0.86  CALCIUM 8.6* 8.4*   PT/INR No results for input(s): LABPROT, INR in the last 72 hours. CMP     Component Value Date/Time   NA 139 07/28/2016 0513   K 4.2 07/28/2016 0513   CL 103 07/28/2016 0513   CO2 27 07/28/2016 0513   GLUCOSE 86 07/28/2016 0513   BUN 12 07/28/2016 0513   CREATININE 0.86 07/28/2016 0513   CALCIUM 8.4 (L) 07/28/2016 0513   PROT 7.0 07/26/2016 1942   ALBUMIN 4.1 07/26/2016 1942   AST 22 07/26/2016 1942   ALT 17 07/26/2016 1942   ALKPHOS 60 07/26/2016 1942   BILITOT 0.5 07/26/2016 1942    GFRNONAA >60 07/28/2016 0513   GFRAA >60 07/28/2016 0513   Lipase     Component Value Date/Time   LIPASE 33 07/26/2016 1942       Studies/Results: US Transvaginal Non-ob  Addendum Date: 07/26/2016   ADDENDUM REPORT: 07/26/2016 22:52 ADDENDUM: Repeat imaging was performed following the patient's CT study which identified both ovaries high within the pelvis. The ovaries are visualized and appear normal in size with the right measuring approximately 3.1 x 2.3 x 2.1 cm and the left measuring 3.1 x 2.5 x 3 cm. Arterial and venous waveforms are demonstrated within both ovaries. No torsion is noted. Electronically Signed   By: Ashley Royalty M.D.   On: 07/26/2016 22:52   Result Date: 07/26/2016 CLINICAL DATA:  Right lower quadrant pain EXAM: TRANSABDOMINAL AND TRANSVAGINAL ULTRASOUND OF PELVIS DOPPLER ULTRASOUND OF OVARIES TECHNIQUE: Both transabdominal and transvaginal ultrasound examinations of the pelvis were performed. Transabdominal technique was performed for global imaging of the pelvis including uterus, ovaries, adnexal regions, and pelvic cul-de-sac. It was necessary to proceed with endovaginal exam following the transabdominal exam to visualize the adnexa. Color and duplex Doppler ultrasound was utilized to evaluate blood flow to the ovaries. COMPARISON:  11/02/2011 CT FINDINGS: Uterus Surgically absent Endometrium Not applicable. Right ovary Not visualized Left ovary Not visualized Other findings Trace  free fluid likely physiologic. Technologist notes the patient could not tolerate pressure from either form of imaging. IMPRESSION: 1. Status post hysterectomy. 2. Neither ovary was visualized. No adnexal mass however is seen. Trace free fluid. Electronically Signed: By: Ashley Royalty M.D. On: 07/26/2016 21:12   US Pelvis Complete  Addendum Date: 07/26/2016   ADDENDUM REPORT: 07/26/2016 22:52 ADDENDUM: Repeat imaging was performed following the patient's CT study which identified both ovaries high  within the pelvis. The ovaries are visualized and appear normal in size with the right measuring approximately 3.1 x 2.3 x 2.1 cm and the left measuring 3.1 x 2.5 x 3 cm. Arterial and venous waveforms are demonstrated within both ovaries. No torsion is noted. Electronically Signed   By: Ashley Royalty M.D.   On: 07/26/2016 22:52   Result Date: 07/26/2016 CLINICAL DATA:  Right lower quadrant pain EXAM: TRANSABDOMINAL AND TRANSVAGINAL ULTRASOUND OF PELVIS DOPPLER ULTRASOUND OF OVARIES TECHNIQUE: Both transabdominal and transvaginal ultrasound examinations of the pelvis were performed. Transabdominal technique was performed for global imaging of the pelvis including uterus, ovaries, adnexal regions, and pelvic cul-de-sac. It was necessary to proceed with endovaginal exam following the transabdominal exam to visualize the adnexa. Color and duplex Doppler ultrasound was utilized to evaluate blood flow to the ovaries. COMPARISON:  11/02/2011 CT FINDINGS: Uterus Surgically absent Endometrium Not applicable. Right ovary Not visualized Left ovary Not visualized Other findings Trace free fluid likely physiologic. Technologist notes the patient could not tolerate pressure from either form of imaging. IMPRESSION: 1. Status post hysterectomy. 2. Neither ovary was visualized. No adnexal mass however is seen. Trace free fluid. Electronically Signed: By: Ashley Royalty M.D. On: 07/26/2016 21:12   Ct Abdomen Pelvis W Contrast  Result Date: 07/26/2016 CLINICAL DATA:  Patient with right lower quadrant abdominal pain, worse with movement. EXAM: CT ABDOMEN AND PELVIS WITH CONTRAST TECHNIQUE: Multidetector CT imaging of the abdomen and pelvis was performed using the standard protocol following bolus administration of intravenous contrast. CONTRAST:  164mL ISOVUE-300 IOPAMIDOL (ISOVUE-300) INJECTION 61% COMPARISON:  Ultrasound pelvis 07/26/2016 FINDINGS: Lower chest: Normal heart size. Lung bases are clear. No pleural effusion.  Hepatobiliary: Liver is normal in size and contour. Gallbladder is unremarkable. Pancreas: Unremarkable Spleen: Unremarkable Adrenals/Urinary Tract: Adrenal glands are normal. Kidneys enhance symmetrically with contrast. No hydronephrosis. Stomach/Bowel: No abnormal bowel wall thickening or evidence for bowel obstruction. No free fluid or free intraperitoneal air. Postsurgical changes involving the stomach. Vascular/Lymphatic: Normal caliber abdominal aorta. No retroperitoneal lymphadenopathy. Reproductive: Status post hysterectomy. The ovaries are demonstrated high within the pelvis. The ovaries are grossly unremarkable in appearance. Probable follicle left ovary. Other: None. Musculoskeletal: Lumbar spine degenerative changes. No aggressive or acute appearing osseous lesions. Probable small bone island within the left ilium (image 74; series 3). IMPRESSION: No acute process within the abdomen or pelvis. The appendix is not visualized however there is no significant right lower quadrant inflammatory change. Electronically Signed   By: Lovey Newcomer M.D.   On: 07/26/2016 21:31   [place] Art/ven Flow Abd Pelv Doppler  Addendum Date: 07/26/2016   ADDENDUM REPORT: 07/26/2016 22:52 ADDENDUM: Repeat imaging was performed following the patient's CT study which identified both ovaries high within the pelvis. The ovaries are visualized and appear normal in size with the right measuring approximately 3.1 x 2.3 x 2.1 cm and the left measuring 3.1 x 2.5 x 3 cm. Arterial and venous waveforms are demonstrated within both ovaries. No torsion is noted. Electronically Signed   By: Ashley Royalty  M.D.   On: 07/26/2016 22:52   Result Date: 07/26/2016 CLINICAL DATA:  Right lower quadrant pain EXAM: TRANSABDOMINAL AND TRANSVAGINAL ULTRASOUND OF PELVIS DOPPLER ULTRASOUND OF OVARIES TECHNIQUE: Both transabdominal and transvaginal ultrasound examinations of the pelvis were performed. Transabdominal technique was performed for global imaging of  the pelvis including uterus, ovaries, adnexal regions, and pelvic cul-de-sac. It was necessary to proceed with endovaginal exam following the transabdominal exam to visualize the adnexa. Color and duplex Doppler ultrasound was utilized to evaluate blood flow to the ovaries. COMPARISON:  11/02/2011 CT FINDINGS: Uterus Surgically absent Endometrium Not applicable. Right ovary Not visualized Left ovary Not visualized Other findings Trace free fluid likely physiologic. Technologist notes the patient could not tolerate pressure from either form of imaging. IMPRESSION: 1. Status post hysterectomy. 2. Neither ovary was visualized. No adnexal mass however is seen. Trace free fluid. Electronically Signed: By: Ashley Royalty M.D. 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 Excell Seltzer with 80lb weight loss - admitted late on 6/3 - CT showed no acute process within the abdomen or pelvis - WBC 12 on arrival, down to 9 today; afebrile - last BM 6/3  ID - cipro 6/4>> FEN - IVF, bariatric clears VTE - SCDs, lovenox  Plan - persistent RLQ abdominal pain, but WBC is WNL today and patient afebrile. VSS. Continue IV antibiotics and pain control. Repeat CBC in AM. Will continue to monitor.   LOS: 1 day    Jerrye Beavers , [facility] 07/28/2016, 8:45 AM Pager: [phone] Consults: [phone] Mon-Fri 7:00 am-4:30 pm Sat-Sun 7:00 am-11:30 am

## 2016-07-29 ENCOUNTER — Inpatient Hospital Stay (HOSPITAL_COMMUNITY): Payer: BC Managed Care – PPO | Admitting: Certified Registered Nurse Anesthetist

## 2016-07-29 ENCOUNTER — Encounter (HOSPITAL_COMMUNITY): Admission: EM | Disposition: A | Discharge status: Home / Self Care | Payer: Self-pay | Source: Home / Self Care

## 2016-07-29 ENCOUNTER — Encounter (HOSPITAL_COMMUNITY): Payer: Self-pay | Admitting: Certified Registered Nurse Anesthetist

## 2016-07-29 HISTORY — PX: LAPAROSCOPY: SHX197

## 2016-07-29 LAB — SURGICAL PCR SCREEN
MRSA, PCR: NEGATIVE
STAPHYLOCOCCUS AUREUS: NEGATIVE

## 2016-07-29 SURGERY — LAPAROSCOPY, DIAGNOSTIC
Anesthesia: General | Site: Abdomen

## 2016-07-29 MED ORDER — FENTANYL CITRATE (PF) 100 MCG/2ML IJ SOLN
INTRAMUSCULAR | Status: DC | PRN
  Administered 2016-07-29 (×2): 50 ug via INTRAVENOUS
  Administered 2016-07-29: 100 ug via INTRAVENOUS
  Administered 2016-07-29: 50 ug via INTRAVENOUS

## 2016-07-29 MED ORDER — SODIUM CHLORIDE 0.9 % IR SOLN
Status: DC | PRN
  Administered 2016-07-29: 1000 mL

## 2016-07-29 MED ORDER — HYDROMORPHONE HCL 1 MG/ML IJ SOLN
0.2500 mg | INTRAMUSCULAR | Status: DC | PRN
  Administered 2016-07-29: 0.5 mg via INTRAVENOUS

## 2016-07-29 MED ORDER — HYDROMORPHONE HCL 1 MG/ML IJ SOLN
0.5000 mg | INTRAMUSCULAR | Status: DC | PRN
  Administered 2016-07-29 – 2016-07-31 (×4): 1 mg via INTRAVENOUS
  Administered 2016-07-31: 0.5 mg via INTRAVENOUS
  Filled 2016-07-29 (×5): qty 1

## 2016-07-29 MED ORDER — MIDAZOLAM HCL 2 MG/2ML IJ SOLN
INTRAMUSCULAR | Status: AC
  Filled 2016-07-29: qty 2

## 2016-07-29 MED ORDER — BUPIVACAINE-EPINEPHRINE (PF) 0.25% -1:200000 IJ SOLN
INTRAMUSCULAR | Status: AC
  Filled 2016-07-29: qty 30

## 2016-07-29 MED ORDER — MIDAZOLAM HCL 5 MG/5ML IJ SOLN
INTRAMUSCULAR | Status: DC | PRN
Start: 2016-07-29 — End: 2016-07-29
  Administered 2016-07-29: 2 mg via INTRAVENOUS

## 2016-07-29 MED ORDER — ACETAMINOPHEN 10 MG/ML IV SOLN
INTRAVENOUS | Status: DC | PRN
  Administered 2016-07-29: 1000 mg via INTRAVENOUS

## 2016-07-29 MED ORDER — LACTATED RINGERS IV SOLN
INTRAVENOUS | Status: DC
  Administered 2016-07-29: 15:00:00 via INTRAVENOUS

## 2016-07-29 MED ORDER — HYDROMORPHONE HCL 1 MG/ML IJ SOLN
INTRAMUSCULAR | Status: AC
  Filled 2016-07-29: qty 0.5

## 2016-07-29 MED ORDER — ONDANSETRON HCL 4 MG/2ML IJ SOLN
INTRAMUSCULAR | Status: DC | PRN
  Administered 2016-07-29: 4 mg via INTRAVENOUS

## 2016-07-29 MED ORDER — ACETAMINOPHEN 500 MG PO TABS
1000.0000 mg | ORAL_TABLET | Freq: Three times a day (TID) | ORAL | Status: DC
  Administered 2016-07-30 – 2016-08-01 (×6): 1000 mg via ORAL
  Filled 2016-07-29 (×6): qty 2

## 2016-07-29 MED ORDER — DEXAMETHASONE SODIUM PHOSPHATE 10 MG/ML IJ SOLN
INTRAMUSCULAR | Status: DC | PRN
  Administered 2016-07-29: 10 mg via INTRAVENOUS

## 2016-07-29 MED ORDER — METRONIDAZOLE IN NACL 500-0.74 MG/100ML-% IV SOLN
500.0000 mg | INTRAVENOUS | Status: AC
  Administered 2016-07-29: 500 mg via INTRAVENOUS
  Filled 2016-07-29: qty 100

## 2016-07-29 MED ORDER — BUPIVACAINE-EPINEPHRINE 0.25% -1:200000 IJ SOLN
INTRAMUSCULAR | Status: DC | PRN
  Administered 2016-07-29: 18 mL

## 2016-07-29 MED ORDER — LIDOCAINE 2% (20 MG/ML) 5 ML SYRINGE
INTRAMUSCULAR | Status: AC
  Filled 2016-07-29: qty 5

## 2016-07-29 MED ORDER — SUGAMMADEX SODIUM 500 MG/5ML IV SOLN
INTRAVENOUS | Status: DC | PRN
  Administered 2016-07-29: 219.6 mg via INTRAVENOUS

## 2016-07-29 MED ORDER — PROPOFOL 10 MG/ML IV BOLUS
INTRAVENOUS | Status: AC
  Filled 2016-07-29: qty 20

## 2016-07-29 MED ORDER — CIPROFLOXACIN IN D5W 400 MG/200ML IV SOLN
400.0000 mg | INTRAVENOUS | Status: AC
  Filled 2016-07-29: qty 200

## 2016-07-29 MED ORDER — METRONIDAZOLE IN NACL 5-0.79 MG/ML-% IV SOLN
500.0000 mg | INTRAVENOUS | Status: DC
  Filled 2016-07-29 (×2): qty 100

## 2016-07-29 MED ORDER — FENTANYL CITRATE (PF) 250 MCG/5ML IJ SOLN
INTRAMUSCULAR | Status: AC
  Filled 2016-07-29: qty 5

## 2016-07-29 MED ORDER — SUGAMMADEX SODIUM 500 MG/5ML IV SOLN
INTRAVENOUS | Status: AC
  Filled 2016-07-29: qty 5

## 2016-07-29 MED ORDER — PROPOFOL 10 MG/ML IV BOLUS
INTRAVENOUS | Status: DC | PRN
  Administered 2016-07-29: 140 mg via INTRAVENOUS

## 2016-07-29 MED ORDER — ROCURONIUM BROMIDE 100 MG/10ML IV SOLN
INTRAVENOUS | Status: DC | PRN
  Administered 2016-07-29: 70 mg via INTRAVENOUS

## 2016-07-29 MED ORDER — ROCURONIUM BROMIDE 10 MG/ML (PF) SYRINGE
PREFILLED_SYRINGE | INTRAVENOUS | Status: AC
  Filled 2016-07-29: qty 5

## 2016-07-29 MED ORDER — ACETAMINOPHEN 10 MG/ML IV SOLN
1000.0000 mg | Freq: Four times a day (QID) | INTRAVENOUS | Status: AC
  Administered 2016-07-29 – 2016-07-30 (×3): 1000 mg via INTRAVENOUS
  Filled 2016-07-29 (×3): qty 100

## 2016-07-29 SURGICAL SUPPLY — 36 items
APPLIER CLIP 5 13 M/L LIGAMAX5 (MISCELLANEOUS)
BLADE CLIPPER SURG (BLADE) IMPLANT
CANISTER SUCT 3000ML PPV (MISCELLANEOUS) ×2 IMPLANT
CHLORAPREP W/TINT 26ML (MISCELLANEOUS) ×4 IMPLANT
CLIP APPLIE 5 13 M/L LIGAMAX5 (MISCELLANEOUS) IMPLANT
COVER SURGICAL LIGHT HANDLE (MISCELLANEOUS) ×2 IMPLANT
CUTTER FLEX LINEAR 45M (STAPLE) ×2 IMPLANT
DECANTER SPIKE VIAL GLASS SM (MISCELLANEOUS) ×4 IMPLANT
DERMABOND ADVANCED (GAUZE/BANDAGES/DRESSINGS) ×1
DERMABOND ADVANCED .7 DNX12 (GAUZE/BANDAGES/DRESSINGS) ×1 IMPLANT
DRAPE LAPAROSCOPIC ABDOMINAL (DRAPES) ×2 IMPLANT
ELECT REM PT RETURN 9FT ADLT (ELECTROSURGICAL) ×2
ELECTRODE REM PT RTRN 9FT ADLT (ELECTROSURGICAL) ×1 IMPLANT
GLOVE BIOGEL PI IND STRL 8 (GLOVE) ×1 IMPLANT
GLOVE BIOGEL PI INDICATOR 8 (GLOVE) ×1
GLOVE ECLIPSE 7.5 STRL STRAW (GLOVE) ×2 IMPLANT
GOWN STRL REUS W/ TWL LRG LVL3 (GOWN DISPOSABLE) ×2 IMPLANT
GOWN STRL REUS W/TWL LRG LVL3 (GOWN DISPOSABLE) ×2
KIT BASIN OR (CUSTOM PROCEDURE TRAY) ×2 IMPLANT
KIT ROOM TURNOVER OR (KITS) ×2 IMPLANT
NS IRRIG 1000ML POUR BTL (IV SOLUTION) ×2 IMPLANT
PAD ARMBOARD 7.5X6 YLW CONV (MISCELLANEOUS) ×4 IMPLANT
RELOAD STAPLE TA45 3.5 REG BLU (ENDOMECHANICALS) ×2 IMPLANT
SCISSORS LAP 5X35 DISP (ENDOMECHANICALS) IMPLANT
SET IRRIG TUBING LAPAROSCOPIC (IRRIGATION / IRRIGATOR) IMPLANT
SHEARS HARMONIC ACE PLUS 36CM (ENDOMECHANICALS) ×2 IMPLANT
SLEEVE ENDOPATH XCEL 5M (ENDOMECHANICALS) ×2 IMPLANT
SUT MNCRL AB 4-0 PS2 18 (SUTURE) ×4 IMPLANT
TOWEL OR 17X24 6PK STRL BLUE (TOWEL DISPOSABLE) ×2 IMPLANT
TOWEL OR 17X26 10 PK STRL BLUE (TOWEL DISPOSABLE) ×2 IMPLANT
TRAY LAPAROSCOPIC MC (CUSTOM PROCEDURE TRAY) ×2 IMPLANT
TROCAR XCEL BLUNT TIP 100MML (ENDOMECHANICALS) IMPLANT
TROCAR XCEL NON-BLD 11X100MML (ENDOMECHANICALS) ×2 IMPLANT
TROCAR XCEL NON-BLD 5MMX100MML (ENDOMECHANICALS) ×2 IMPLANT
TUBING INSUFFLATION (TUBING) ×2 IMPLANT
WATER STERILE IRR 1000ML POUR (IV SOLUTION) IMPLANT

## 2016-07-29 NOTE — Anesthesia Procedure Notes (Signed)
Procedure Name: Intubation Date/Time: 07/29/2016 3:56 PM Performed by: Candis Shine Pre-anesthesia Checklist: Patient identified, Emergency Drugs available, Suction available and Patient being monitored Patient Re-evaluated:Patient Re-evaluated prior to inductionOxygen Delivery Method: Circle System Utilized Preoxygenation: Pre-oxygenation with 100% oxygen Intubation Type: IV induction Ventilation: Mask ventilation without difficulty Laryngoscope Size: Mac and 3 Grade View: Grade I Tube type: Oral Tube size: 7.0 mm Number of attempts: 1 Airway Equipment and Method: Stylet Placement Confirmation: ETT inserted through vocal cords under direct vision,  positive ETCO2 and breath sounds checked- equal and bilateral Secured at: 22 cm Tube secured with: Tape Dental Injury: Teeth and Oropharynx as per pre-operative assessment

## 2016-07-29 NOTE — Anesthesia Preprocedure Evaluation (Addendum)
Anesthesia Evaluation  Patient identified by MRN, date of birth, ID band Patient awake    Reviewed: Allergy & Precautions, H&P , NPO status , Patient's Chart, lab work & pertinent test results  Airway Mallampati: III  TM Distance: >3 FB Neck ROM: Full    Dental no notable dental hx. (+) Teeth Intact, Dental Advisory Given   Pulmonary neg pulmonary ROS,    Pulmonary exam normal breath sounds clear to auscultation       Cardiovascular negative cardio ROS   Rhythm:Regular Rate:Normal     Neuro/Psych Anxiety negative neurological ROS  negative psych ROS   GI/Hepatic Neg liver ROS, GERD  Medicated and Controlled,  Endo/Other  negative endocrine ROSMorbid obesity  Renal/GU negative Renal ROS  negative genitourinary   Musculoskeletal   Abdominal   Peds  Hematology negative hematology ROS (+)   Anesthesia Other Findings   Reproductive/Obstetrics negative OB ROS                            Anesthesia Physical Anesthesia Plan  ASA: III  Anesthesia Plan: General   Post-op Pain Management:    Induction: Intravenous  PONV Risk Score and Plan: 4 or greater and Ondansetron, Dexamethasone, Propofol and Midazolam  Airway Management Planned: Oral ETT  Additional Equipment:   Intra-op Plan:   Post-operative Plan: Extubation in OR  Informed Consent: I have reviewed the patients History and Physical, chart, labs and discussed the procedure including the risks, benefits and alternatives for the proposed anesthesia with the patient or authorized representative who has indicated his/her understanding and acceptance.   Dental advisory given  Plan Discussed with: CRNA  Anesthesia Plan Comments:        Anesthesia Quick Evaluation

## 2016-07-29 NOTE — Anesthesia Postprocedure Evaluation (Signed)
Anesthesia Post Note  Patient: Mary Williams  Procedure(s) Performed: Procedure(s) (LRB): LAPAROSCOPY DIAGNOSTIC with appendectomy and lysis of adhesions (N/A)     Patient location during evaluation: PACU Anesthesia Type: General Level of consciousness: awake, awake and alert and oriented Pain management: pain level controlled Vital Signs Assessment: post-procedure vital signs reviewed and stable Respiratory status: spontaneous breathing, nonlabored ventilation and respiratory function stable Cardiovascular status: blood pressure returned to baseline Anesthetic complications: no    Last Vitals:  Vitals:   07/29/16 1755 07/29/16 1824  BP: 126/73 130/75  Pulse: 80 75  Resp: 18 14  Temp: 36.7 C 36.8 C    Last Pain:  Vitals:   07/29/16 1824  TempSrc: Oral  PainSc: 2                  Dade Rodin COKER

## 2016-07-29 NOTE — Transfer of Care (Signed)
Immediate Anesthesia Transfer of Care Note  Patient: Mary Williams  Procedure(s) Performed: Procedure(s): LAPAROSCOPY DIAGNOSTIC with appendectomy and lysis of adhesions (N/A)  Patient Location: PACU  Anesthesia Type:General  Level of Consciousness: awake, alert  and oriented  Airway & Oxygen Therapy: Patient Spontanous Breathing and Patient connected to nasal cannula oxygen  Post-op Assessment: Report given to RN and Post -op Vital signs reviewed and stable  Post vital signs: Reviewed and stable  Last Vitals:  Vitals:   07/29/16 1403 07/29/16 1725  BP:  (!) 143/79  Pulse: 65 93  Resp:  17  Temp:  36.4 C    Last Pain:  Vitals:   07/29/16 1725  TempSrc:   PainSc: (P) 0-No pain      Patients Stated Pain Goal: 2 (95/63/87 5643)  Complications: No apparent anesthesia complications

## 2016-07-29 NOTE — Op Note (Signed)
OPERATIVE REPORT  DATE OF OPERATION:  07/29/2016  PATIENT:  Mary Williams  41 y.o. female  PRE-OPERATIVE DIAGNOSIS:  Right lower quadrant abdominal pain  POST-OPERATIVE DIAGNOSIS:  Abdominal adhesions, fibrous obliterans of the appendix, left ovarian cyst  INDICATION(S) FOR OPERATION:  Persistent RLQ abdominal pain   FINDINGS:  Abdominal adhesions in the RLQ and in the pelvic involving the right adnexa and the sigmoid colon.  Tens left ovarian cyst which rupture with manipulation  PROCEDURE:  Procedure(s): LAPAROSCOPY DIAGNOSTIC with appendectomy and Laparoscopic lysis of adhesions Fibrous obliterans of the appendix  SURGEON:  Surgeon(s): Judeth Horn, MD  ASSISTANT: None  ANESTHESIA:   general  COMPLICATIONS:  None  EBL: 10 ml  BLOOD ADMINISTERED: none  DRAINS: none   SPECIMEN:  Source of Specimen:  Appendix  COUNTS CORRECT:  YES  PROCEDURE DETAILS: The patient was taken to the operating room and placed on table in supine position. After an adequate general endotracheal anesthetic was administered, she was prepped and draped in usual sterile manner exposing her abdomen.  A proper timeout was performed identifying the patient and procedure to be performed. A supraumbilical midline incision was made with #15 blade and taken down to the midline fascia which is rapid Coker clamps. We used this incised into the fascia and subsequently had to make a separate incision into the peritoneum. Once we had access to the peritoneal cavity a pursestring suture of 0 Vicryl was passed around the fascial opening. We secured in place a Hassan cannula which was used to insufflate carbon dioxide gas up to a maximal intra-abdominal pressure of 15 mmHg.  A right upper quadrant 5 mm cannula and a left low quadrant 5 mm cannula pass under direct vision and perineal cavity.  Immediately upon inspecting the peritoneal cavity there was a omental and peritoneal adhesion attaching a tongue the fatty  tissue to the right abdominal wall without any obvious obstructive bowel. Limited running of the small bowel did not demonstrate any evidence of obstruction or ischemia.  There were dense adhesions to the right adnexa with the fallopian tube looking inflamed. There was a cyst of the left ovary which ruptured during manipulation to look at the left fallopian tube which also looks slightly inflamed. There was no pus noted anywhere.  There were dense adhesions attaching the sigmoid colon to the pelvic wall part of which were taken down using a Harmonic Scalpel. Care was taken not to injure the colon wall itself.  The tongue of tissue over to the right lower quadrant was taken down using a Harmonic Scalpel and resected. This was not sent as a separate specimen. Once these adhesions were taken down we identified the very small and obliterated appendix and took it down using a Harmonic Scalpel and a blue cartridge Endo GIA retrieving it from the 11 mm cannula site.  Once all this is done and those no evidence of bleeding, we were able to removed all cannulas and I'll the umbilical fascia site using the pursestring suture which is in place.  0.25% Marcaine was injected at all sites. The skin at the super umbilical site was closed using running subcuticular stitch of 4-0 Monocryl. Dermabond Steri-Strips and Tegaderms are used to complete our dressings. All needle counts, sponge counts, and instrument counts were correct.  PATIENT DISPOSITION:  PACU - hemodynamically stable.   Mary Williams 6/6/20185:26 PM

## 2016-07-29 NOTE — Progress Notes (Signed)
[facility] Progress Note    Subjective: Patient feels no better except when she gets pain medications.  No peritonitis   Objective: Vital signs in last 24 hours: Temp:  [97.9 F (36.6 C)-98.6 F (37 C)] 98.3 F (36.8 C) (06/06 0515) Pulse Rate:  [57-68] 57 (06/06 0515) Resp:  [16-18] 16 (06/06 0515) BP: (101-123)/(57-79) 110/62 (06/06 0515) SpO2:  [97 %-100 %] 97 % (06/06 0515) Last BM Date: 07/26/16  Intake/Output from previous day: 06/05 0701 - 06/06 0700 In: 2200 [P.O.:1000; I.V.:1000; IV Piggyback:200] Out: 1450 [Urine:1450] Intake/Output this shift: No intake/output data recorded.  General: has headache.  Seem miserable  Lungs: Clear  Abd: Hypoactive bowel sounds.  Extremities: No changes  Neuro: Intact  Lab Results:  @LABLAST2 (wbc:2,hgb:2,hct:2,plt:2) BMET ) Recent Labs  07/27/16 0049 07/28/16 0513  NA 138 139  K 3.8 4.2  CL 107 103  CO2 24 27  GLUCOSE 97 86  BUN 11 12  CREATININE 0.67 0.86  CALCIUM 8.6* 8.4*   PT/INR No results for input(s): LABPROT, INR in the last 72 hours. ABG No results for input(s): PHART, HCO3 in the last 72 hours.  Invalid input(s): PCO2, PO2  Studies/Results: No results found.  Anti-infectives: Anti-infectives    Start     Dose/Rate Route Frequency Ordered Stop   07/27/16 1130  ciprofloxacin (CIPRO) IVPB 400 mg     400 mg 200 mL/hr over 60 Minutes Intravenous Every 12 hours 07/27/16 1045        Assessment/Plan: s/p  Diagnostic laparoscopy, possible appendectomy today or tomorrwo if no improvement.  LOS: 2 days   Kathryne Eriksson. Dahlia Bailiff, MD, FACS [phone] [facility] 07/29/2016

## 2016-07-29 NOTE — Progress Notes (Addendum)
Kept pt NPO post midnight. Persistent RLQ pain, with short relief from pain meds. No nausea, no vomiting. Abd is soft, RLQ tender. 1400 Report given to Debbie in short stay. Pt to pre op via bed.  1820 Received pt back from PACU, sleepy, responsive to verbal stimuli. 3 lap sites to abd with dressing dry and intact.

## 2016-07-30 ENCOUNTER — Encounter (HOSPITAL_COMMUNITY): Payer: Self-pay | Admitting: General Surgery

## 2016-07-30 ENCOUNTER — Ambulatory Visit: Payer: Self-pay | Admitting: Skilled Nursing Facility1

## 2016-07-30 LAB — COMPREHENSIVE METABOLIC PANEL
ALT: 16 U/L (ref 14–54)
ANION GAP: 7 (ref 5–15)
AST: 29 U/L (ref 15–41)
Albumin: 3.2 g/dL — ABNORMAL LOW (ref 3.5–5.0)
Alkaline Phosphatase: 54 U/L (ref 38–126)
BUN: 6 mg/dL (ref 6–20)
CHLORIDE: 108 mmol/L (ref 101–111)
CO2: 22 mmol/L (ref 22–32)
Calcium: 8.8 mg/dL — ABNORMAL LOW (ref 8.9–10.3)
Creatinine, Ser: 0.78 mg/dL (ref 0.44–1.00)
GFR calc non Af Amer: >60 mL/min (ref >60)
Glucose, Bld: 129 mg/dL — ABNORMAL HIGH (ref 65–99)
Potassium: 4.8 mmol/L (ref 3.5–5.1)
SODIUM: 137 mmol/L (ref 135–145)
Total Bilirubin: 0.6 mg/dL (ref 0.3–1.2)
Total Protein: 6.2 g/dL — ABNORMAL LOW (ref 6.5–8.1)

## 2016-07-30 NOTE — Progress Notes (Signed)
1 Day Post-Op    CC:  Abdominal pain  Subjective: Complains of nausea and pain is different today from the surgery.  Has not been OOB yet.    Objective: Vital signs in last 24 hours: Temp:  [97.6 F (36.4 C)-98.8 F (37.1 C)] 98.3 F (36.8 C) (06/07 0627) Pulse Rate:  [54-93] 61 (06/07 0627) Resp:  [14-19] 17 (06/07 0627) BP: (107-143)/(57-79) 118/62 (06/07 0627) SpO2:  [94 %-100 %] 95 % (06/07 0627) Weight:  [109.8 kg (242 lb)] 109.8 kg (242 lb) (06/06 1418) Last BM Date: 07/27/16 360 PO 2600 IV 1025 urine Afebrile, VSS NO labs    Intake/Output from previous day: 06/06 0701 - 06/07 0700 In: 2947.7 [P.O.:360; I.V.:2587.7] Out: 1035 [Urine:1025; Blood:10] Intake/Output this shift: No intake/output data recorded.  General appearance: alert, cooperative and no distress Resp: clear to auscultation bilaterally GI: soft, sore, BS hypoactive, no flatus, sites OK  Lab Results:   Recent Labs  07/28/16 0513  WBC 9.0  HGB 11.9*  HCT 38.4  PLT 296    BMET  Recent Labs  07/28/16 0513  NA 139  K 4.2  CL 103  CO2 27  GLUCOSE 86  BUN 12  CREATININE 0.86  CALCIUM 8.4*   PT/INR No results for input(s): LABPROT, INR in the last 72 hours.   Recent Labs Lab 07/26/16 1942  AST 22  ALT 17  ALKPHOS 60  BILITOT 0.5  PROT 7.0  ALBUMIN 4.1     Lipase     Component Value Date/Time   LIPASE 33 07/26/2016 1942     Medications: . acetaminophen  1,000 mg Oral Q8H  . enoxaparin (LOVENOX) injection  40 mg Subcutaneous Q24H  . escitalopram  20 mg Oral QHS  . pantoprazole  40 mg Oral QPM   . sodium chloride 100 mL/hr at 07/29/16 1237  . acetaminophen Stopped (07/30/16 0430)  . ciprofloxacin Stopped (07/30/16 0032)  . lactated ringers    . methocarbamol (ROBAXIN)  IV     Anti-infectives    Start     Dose/Rate Route Frequency Ordered Stop   07/29/16 1445  ciprofloxacin (CIPRO) IVPB 400 mg     400 mg 200 mL/hr over 60 Minutes Intravenous To Surgery  07/29/16 1431 07/29/16 1539   07/29/16 1445  metronidazole (FLAGYL) IVPB 500 mg     500 mg 100 mL/hr over 60 Minutes Intravenous To Surgery 07/29/16 1431 07/29/16 1700   07/29/16 1300  metroNIDAZOLE (FLAGYL) IVPB 500 mg  Status:  Discontinued     500 mg 100 mL/hr over 60 Minutes Intravenous To ShortStay Surgical 07/29/16 0851 07/29/16 1820   07/27/16 1130  ciprofloxacin (CIPRO) IVPB 400 mg     400 mg 200 mL/hr over 60 Minutes Intravenous Every 12 hours 07/27/16 1045        Assessment/Plan RLQ pain with  Abdominal adhesions, fibrous obliterans of the appendix, left ovarian cyst  Hx LAPAROSCOPIC GASTRIC SLEEVE RESECTION WITH HIATAL HERNIA REPAIR, , DR Hoxworth, abdominal hysterectomy 2017 S/p LAPAROSCOPY DIAGNOSTIC with appendectomy and Laparoscopic lysis of adhesions, 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 Hulen Skains Hx of anxiety Body mass index is 41.5 FEN:  IV fluids/Regular diet ID:  Cipro 07/27/16 =>> day 4;  Flagyl 07/29/16 pre op  - discontinued  DVT:  lovenox  Plan:  Advance diet as tolerated, mobilize, continue IV fluids, and see how she does.  Recheck labs in AM.       LOS: 3 days    Keeton Kassebaum 07/30/2016 [id number]

## 2016-07-31 LAB — CBC
HCT: 39 % (ref 36.0–46.0)
Hemoglobin: 12.9 g/dL (ref 12.0–15.0)
MCH: 29 pg (ref 26.0–34.0)
MCHC: 33.1 g/dL (ref 30.0–36.0)
MCV: 87.6 fL (ref 78.0–100.0)
PLATELETS: ADEQUATE 10*3/uL (ref 150–400)
RBC: 4.45 MIL/uL (ref 3.87–5.11)
RDW: 14.3 % (ref 11.5–15.5)
WBC: 13.8 10*3/uL — AB (ref 4.0–10.5)

## 2016-07-31 MED ORDER — TRAMADOL HCL 50 MG PO TABS
50.0000 mg | ORAL_TABLET | Freq: Four times a day (QID) | ORAL | Status: DC | PRN
  Administered 2016-08-01: 50 mg via ORAL
  Filled 2016-07-31: qty 1

## 2016-07-31 MED ORDER — PROMETHAZINE HCL 25 MG/ML IJ SOLN: 6.2500 mg | Freq: Three times a day (TID) | INTRAMUSCULAR | Status: DC | PRN

## 2016-07-31 MED ORDER — METOCLOPRAMIDE HCL 5 MG PO TABS
5.0000 mg | ORAL_TABLET | Freq: Three times a day (TID) | ORAL | Status: DC
  Administered 2016-07-31 – 2016-08-01 (×5): 5 mg via ORAL
  Filled 2016-07-31 (×5): qty 1

## 2016-07-31 NOTE — Progress Notes (Signed)
2 Days Post-Op    CC:  Abdominal pain  Subjective: Still complaining of nausea, not eating. Up walking with the staff, very anxious, and dizziness with leaking of her IV. Tylenol 1 g 3 times a day Dilantin 2 mg yesterday and 1.5 mg this a.m. Zofran 3 yesterday  Oxycodone 30 mg PO yesterday Phenergan 12.5 mg 2 yesterday, and once this a.m. Patient notes her husband is taking their daughter to [place] for an overseas trip.  Objective: Vital signs in last 24 hours: Temp:  [98.2 F (36.8 C)-98.5 F (36.9 C)] 98.2 F (36.8 C) (06/08 0604) Pulse Rate:  [63-66] 63 (06/08 0604) Resp:  [17-18] 17 (06/08 0604) BP: (101-108)/(53-66) 106/66 (06/08 0604) SpO2:  [96 %-98 %] 96 % (06/08 0604) Last BM Date: 07/27/16 240 PO 1626 IV 1000 urine Afebrile, VSS WBC still up 13.8  Intake/Output from previous day: 06/07 0701 - 06/08 0700 In: 2666.7 [P.O.:240; I.V.:1626.7; IV Piggyback:800] Out: 1000 [Urine:1000] Intake/Output this shift: No intake/output data recorded.  General appearance: alert, cooperative and no distress Resp: clear to auscultation bilaterally GI: soft, sore, complains of ongoing nausea, site looks fine.  + BS  Lab Results:   Recent Labs  07/31/16 0631  WBC 13.8*  HGB 12.9  HCT 39.0  PLT PLATELET CLUMPS NOTED ON SMEAR, COUNT APPEARS ADEQUATE    BMET  Recent Labs  07/30/16 1106  NA 137  K 4.8  CL 108  CO2 22  GLUCOSE 129*  BUN 6  CREATININE 0.78  CALCIUM 8.8*   PT/INR No results for input(s): LABPROT, INR in the last 72 hours.   Recent Labs Lab 07/26/16 1942 07/30/16 1106  AST 22 29  ALT 17 16  ALKPHOS 60 54  BILITOT 0.5 0.6  PROT 7.0 6.2*  ALBUMIN 4.1 3.2*     Lipase     Component Value Date/Time   LIPASE 33 07/26/2016 1942     Medications: . acetaminophen  1,000 mg Oral Q8H  . enoxaparin (LOVENOX) injection  40 mg Subcutaneous Q24H  . escitalopram  20 mg Oral QHS  . pantoprazole  40 mg Oral QPM   . sodium chloride 50  mL/hr at 07/31/16 0138  . ciprofloxacin 400 mg (07/30/16 2310)  . lactated ringers    . 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 Hulen Skains  POD 2 Hx of anxiety Body mass index is 41.5 FEN:  IV fluids/Regular diet ID:  Cipro 07/27/16 =>> day 4;  Flagyl 07/29/16 pre op  - discontinued  DVT:  lovenox  Plan:  Decrease phenergren, add Reglan, restart Lexapro, check orthostatics, stop oxycodone, try Tramadol    LOS: 4 days    Ceclia Koker 07/31/2016 [phone]

## 2016-08-01 MED ORDER — TRAMADOL HCL 50 MG PO TABS: 50.0000 mg | ORAL_TABLET | Freq: Four times a day (QID) | ORAL | 0 refills | Status: DC | PRN

## 2016-08-01 MED ORDER — SENNA 8.6 MG PO TABS
2.0000 | ORAL_TABLET | Freq: Every day | ORAL | Status: DC
  Administered 2016-08-01: 17.2 mg via ORAL
  Filled 2016-08-01: qty 2

## 2016-08-01 MED ORDER — POLYETHYLENE GLYCOL 3350 17 G PO PACK
17.0000 g | PACK | Freq: Every day | ORAL | Status: DC
  Administered 2016-08-01: 17 g via ORAL
  Filled 2016-08-01: qty 1

## 2016-08-01 NOTE — Discharge Instructions (Signed)
[facility] ______[facility] °LAPAROSCOPIC SURGERY: POST OP INSTRUCTIONS °Always review your discharge instruction sheet given to you by the facility where your surgery was performed. °IF YOU HAVE DISABILITY OR FAMILY LEAVE FORMS, YOU MUST BRING THEM TO THE OFFICE FOR PROCESSING.   °DO NOT GIVE THEM TO YOUR DOCTOR. ° °1. A prescription for pain medication may be given to you upon discharge.  Take your pain medication as prescribed, if needed.  If narcotic pain medicine is not needed, then you may take acetaminophen (Tylenol) or ibuprofen (Advil) as needed. °2. Take your usually prescribed medications unless otherwise directed. °3. If you need a refill on your pain medication, please contact your pharmacy.  They will contact our office to request authorization. Prescriptions will not be filled after 5pm or on week-ends. °4. You should follow a light diet the first few days after arrival home, such as soup and crackers, etc.  Be sure to include lots of fluids daily. °5. Most patients will experience some swelling and bruising in the area of the incisions.  Ice packs will help.  Swelling and bruising can take several days to resolve.  °6. It is common to experience some constipation if taking pain medication after surgery.  Increasing fluid intake and taking a stool softener (such as Colace) will usually help or prevent this problem from occurring.  A mild laxative (Milk of Magnesia or Miralax) should be taken according to package instructions if there are no bowel movements after 48 hours. °7. Unless discharge instructions indicate otherwise, you may remove your bandages 24-48 hours after surgery, and you may shower at that time.  You may have steri-strips (small skin tapes) in place directly over the incision.  These strips should be left on the skin for 7-10 days.  If your surgeon used skin glue on the incision, you may shower in 24 hours.  The glue will flake off over the next 2-3 weeks.  Any sutures or  staples will be removed at the office during your follow-up visit. °8. ACTIVITIES:  You may resume regular (light) daily activities beginning the next day--such as daily self-care, walking, climbing stairs--gradually increasing activities as tolerated.  You may have sexual intercourse when it is comfortable.  Refrain from any heavy lifting or straining until approved by your doctor. °a. You may drive when you are no longer taking prescription pain medication, you can comfortably wear a seatbelt, and you can safely maneuver your car and apply brakes. °b. RETURN TO WORK:  __________________________________________________________ °9. You should see your doctor in the office for a follow-up appointment approximately 2-3 weeks after your surgery.  Make sure that you call for this appointment within a day or two after you arrive home to insure a convenient appointment time. °10. OTHER INSTRUCTIONS: __________________________________________________________________________________________________________________________ __________________________________________________________________________________________________________________________ °WHEN TO CALL YOUR DOCTOR: °1. Fever over 101.0 °2. Inability to urinate °3. Continued bleeding from incision. °4. Increased pain, redness, or drainage from the incision. °5. Increasing abdominal pain ° °The clinic staff is available to answer your questions during regular business hours.  Please don’t hesitate to call and ask to speak to one of the nurses for clinical concerns.  If you have a medical emergency, go to the nearest emergency room or call 911.  A surgeon from [facility] is always on call at the hospital. °[address], [address], [place], [place]  [address] ? [address] [address], [place], [place]   [address] °[phone] ? [phone] ? FAX [phone] °Web site:   [email or web address] °

## 2016-08-01 NOTE — Discharge Summary (Signed)
Patient ID: Mary Williams [id number] 41 y.o. [birth year]  Admit date: 07/26/2016  Discharge date and time: No discharge date for patient encounter.  Admitting Physician: Gurney Maxin  Discharge Physician: Adin Hector  Admission Diagnoses: lower right abdominal pain  Right lower quadrant abdominal pain  Discharge Diagnoses: Fibrous obliterans of the appendix                                         Intra-abdominal adhesions                                         Left ovarian cyst  Operations: Procedure(s): LAPAROSCOPY DIAGNOSTIC with appendectomy and lysis of adhesions  Admission Condition: fair  Discharged Condition: good  Indication for Admission: This is a 41 year old female who presented with a one-day history of abdominal pain.  It was stabbing in nature.  She has had normal bowel movements.  Nausea but no vomiting.  No fevers.  History gastric sleeve in 2017 with 80 pound weight loss.  CT scan showed no appendix and no fluid in the right lower quadrant.  WBC 12,000.  It was not clear whether diagnosis was.  She had moderate stool burden and was given mag citrate.  She was admitted admitted for further evaluation and management  Hospital Course: The patient was admitted for IV fluids, pain control, laxatives and further management.  12 hours following admission the patient was evaluated by Dr. Hulen Skains.  He empirically started her on Cipro.  She was still having pain and abdominal tenderness.  Because of continued symptoms she was taken to the operating room on 07/29/2016 in the afternoon.  Findings were abdominal adhesions in the right lower quadrant.  Appendix was removed but looked relatively normal.  A left ovarian cyst ruptured with manipulation.  Final pathology showed fibrous obliteration of the appendiceal tip but no acute inflammation.     She was slow to resolve her ileus and nausea but ultimately advanced to a regular diet, became ambulatory and independent and was  voiding uneventfully.  She was discharged on 08/01/2016.  She requested discharge home that day.  Examination revealed her abdomen is soft and the wounds looked fine.  She was given instruction in diet and activities.  She was given a prescription for tramadol for pain which she had been using in the hospital.  She will follow-up with Dr. Hulen Skains in 3 weeks.     Consults: None  Significant Diagnostic Studies: Surgical pathology  Treatments: surgery: Detailed above  Disposition: Home  Patient Instructions:  Allergies as of 08/01/2016      Reactions   Fish Allergy Anaphylaxis   Penicillins Anaphylaxis   Has patient had a PCN reaction causing immediate rash, facial/tongue/throat swelling, SOB or lightheadedness with hypotension: Yes Has patient had a PCN reaction causing severe rash involving mucus membranes or skin necrosis: No Has patient had a PCN reaction that required hospitalization No Has patient had a PCN reaction occurring within the last 10 years: No If all of the above answers are "NO", then may proceed with Cephalosporin use.   Shellfish Allergy Anaphylaxis   Ceclor [cefaclor] Hives      Medication List    TAKE these medications   escitalopram 20 MG tablet Commonly known as:  LEXAPRO Take 20 mg by mouth  at bedtime.   pantoprazole 40 MG tablet Commonly known as:  PROTONIX Take 40 mg by mouth every evening.   traMADol 50 MG tablet Commonly known as:  ULTRAM Take 1 tablet (50 mg total) by mouth every 6 (six) hours as needed for moderate pain.       Activity: activity as tolerated Diet: Bariatric diet Wound Care: none needed  Follow-up:  With Dr. Hulen Skains in 3 weeks.  Signed: Edsel Petrin. Dalbert Batman, M.D., FACS General and minimally invasive surgery Breast and Colorectal Surgery  08/01/2016, 10:49 AM

## 2016-08-08 ENCOUNTER — Telehealth: Payer: Self-pay | Admitting: Surgery

## 2016-08-08 NOTE — Telephone Encounter (Signed)
Ms. Holsonback underwent a lap appendectomy by Dr. Hulen Skains on 07/29/2016.  She has developed some redness and pain around her umbilical incision.  She is going to put some warm compresses on the wound.  If no better, consider going to ER or see [place] in the office on Monday.  Alphonsa Overall, MD, [facility] Pager: [phone] Office phone:  [phone]

## 2016-08-13 ENCOUNTER — Ambulatory Visit: Payer: Self-pay | Admitting: General Surgery

## 2016-08-14 ENCOUNTER — Other Ambulatory Visit: Payer: Self-pay | Admitting: General Surgery

## 2016-08-14 DIAGNOSIS — K3580 Unspecified acute appendicitis: Secondary | ICD-10-CM

## 2016-09-03 ENCOUNTER — Ambulatory Visit: Payer: Self-pay | Admitting: Skilled Nursing Facility1

## 2016-09-09 ENCOUNTER — Encounter: Payer: Self-pay | Admitting: Skilled Nursing Facility1

## 2016-09-09 ENCOUNTER — Encounter: Payer: BC Managed Care – PPO | Attending: General Surgery | Admitting: Skilled Nursing Facility1

## 2016-09-09 DIAGNOSIS — Z9884 Bariatric surgery status: Secondary | ICD-10-CM | POA: Diagnosis not present

## 2016-09-09 DIAGNOSIS — Z713 Dietary counseling and surveillance: Secondary | ICD-10-CM | POA: Diagnosis present

## 2016-09-09 DIAGNOSIS — Z6841 Body Mass Index (BMI) 40.0 and over, adult: Secondary | ICD-10-CM

## 2016-09-09 NOTE — Progress Notes (Signed)
Follow-up visit:  8 Weeks Post-Operative Sleeve Gastrectomy Surgery  Medical Nutrition Therapy:  Appt start time: 2:32 end time:  3:20  Primary concerns today: Post-operative Bariatric Surgery Nutrition Management. Pt states she wants to be 200 pounds and then 180 pounds.    Pt states she had a cyst from her ovary removed and an appendectomy. Pt states she needs to get back on track. Pt states she cannot do any seafood. 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 Pt states the GERD is much better as long as she is taking the protonix. Pt worries if adding the pretzels and bread back in will cause her to gain the weight back. Pt states she was not physically active for a few weeks but has started back being active.   11/27/2015: 285 pounds Today: 233.4 pounds  Weight Change: 6.72  TANITA  BODY COMP RESULTS  10/29/15 11/27/15 12/26/2015 02/21/2016 05/28/2016   BMI (kg/m^2) 53.3 49.0 47.2 44.2 41.3   Fat Mass (lbs) 167.8 149.4 136.4 128 108.4   Fat Free Mass (lbs) 143 135.8 138.6 129.4 132.4   Total Body Water (lbs) 107.2 101.2 102.6 95.4 96.8   TANITA  BODY COMP RESULTS  09/09/2016   BMI (kg/m^2) 40.1   Fat Mass (lbs) 104   Fat Free Mass (lbs) 129.4   Total Body Water (lbs) 94.2    Preferred Learning Style:  No preference indicated   Learning Readiness:   Change in progress  Goals: Just take tums -Opurity Complete Bariatric Optimized Multivitamin Multimineral Supplement - Capsule (90-day Supply)  (google that) -Keep aware of your body and what she is telling you and what you are snacking on -Try 8.8pH water Alkaline  -Keep on working on loving yourself and knowing that guilt gets you nowhere  24-hr recall: B (AM): coffee with protein powder (20 grams) and creamer and yogurt (12g protein) OR 1-2 eggs (7-14) Snk (2 hours later AM): toast or greek yogurt L (PM): sandwich meat or low carb tortilla with sandwich meat and cheese----just yogurt Snk (PM): yogurt OR cheese  stick OR baked cheese (7grams) or pretzels Or protein bar: [place] D (PM): steak and riced cauliflower or broccoli (21 protein)---pizza one night with no issue---pork chop---chicken  Snk (PM):  *meals outside the home: 2: getting a burger with no bun  Fluid intake: 20 ounces caffinated coffee with protein powder, crystal light (64 ounces), un sweet tea (16 ounces), diet green tea:  Estimated total protein intake: 88 grams  Medications: No change Supplementation: womans one a day and no calcium Using straws: Yes Drinking while eating: NO Hair loss: some but no issues  Carbonated beverages: NO N/V/D/C: only diarrhea with caffinated beverages/protein shake in the coffee   Recent physical activity:  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   Nutritional Diagnosis:  [place]-3.3 Overweight/obesity related to past poor dietary habits and physical inactivity as evidenced by patient w/ recent Sleeve Gastrectomy surgery following dietary guidelines for continued weight loss.  Intervention:  Nutrition counseling for obesity. Dietitian educated the pt on diet advancement 8 weeks after surgery.  Goals: -Have vegetables with every lunch and dinner -Only have one snack of nuts or pretzels or toast -If you have already had a snack and are wanting another one before your next meal have non-starchy vegatbles  Teaching Method Utilized: Visual Auditory Hands on  Barriers to learning/adherence to lifestyle change: possibly wanting banana/carbohydrate containing foods  Demonstrated degree  of understanding via:  Teach Back   Monitoring/Evaluation:  Dietary intake, exercise, lap band fills, and body weight. Follow up in 2 months for 4 month post-op visit.

## 2016-11-23 ENCOUNTER — Other Ambulatory Visit: Payer: Self-pay | Admitting: General Surgery

## 2016-11-23 DIAGNOSIS — R1110 Vomiting, unspecified: Secondary | ICD-10-CM

## 2016-11-23 DIAGNOSIS — K219 Gastro-esophageal reflux disease without esophagitis: Secondary | ICD-10-CM

## 2016-11-23 DIAGNOSIS — Z9884 Bariatric surgery status: Secondary | ICD-10-CM

## 2016-11-27 ENCOUNTER — Other Ambulatory Visit: Payer: Self-pay | Admitting: General Surgery

## 2016-11-27 ENCOUNTER — Ambulatory Visit
Admission: RE | Admit: 2016-11-27 | Discharge: 2016-11-27 | Disposition: A | Discharge status: Home / Self Care | Payer: BC Managed Care – PPO | Source: Ambulatory Visit | Attending: General Surgery | Admitting: General Surgery

## 2016-11-27 DIAGNOSIS — K219 Gastro-esophageal reflux disease without esophagitis: Secondary | ICD-10-CM

## 2016-11-27 DIAGNOSIS — Z9884 Bariatric surgery status: Secondary | ICD-10-CM

## 2016-11-27 DIAGNOSIS — R1110 Vomiting, unspecified: Secondary | ICD-10-CM

## 2016-12-03 ENCOUNTER — Other Ambulatory Visit: Payer: Self-pay | Admitting: Surgery

## 2016-12-07 ENCOUNTER — Ambulatory Visit: Payer: Self-pay | Admitting: Skilled Nursing Facility1

## 2016-12-08 ENCOUNTER — Encounter (HOSPITAL_COMMUNITY): Payer: Self-pay

## 2016-12-13 NOTE — H&P (Signed)
Mary Williams  Location: [facility] Patient #: [id number] DOB: September 28, 1975 Married / Language: English / Race: White Female  History of Present Illness   The patient is a 41 year old female presenting status-post bariatric surgery. She returns now 1 year following laparoscopic sleeve gastrectomy (11/12/2015 - Hoxworth) for severe morbid obesity with comorbidities of chronic back and joint pain and mild GERD. She has had some controllable GERD postoperatively which unfortunately has gotten a lot worse over the last couple of months. She now reports that she is having episodes of small volume emesis 2 or 3 times per day. Usually with solid foods and able to keep liquids down. She does have frequent significant heartburn and water brash as well.   Currently on Protonix 40 mg daily. She does not eat after separate 6 or 7. At the time of her surgery we did repair a moderate sized hiatal hernia.  UGI - 11/27/2016 - 1. Small sliding-type hiatal hernia and inducible GE reflux.  2. Esophageal dysmotility with moderate esophageal stasis.  3. Distal esophageal stricture. The 13 mm barium pill would not pass through this area.  4. Normal postoperative appearance of the stomach. No complicating features.   Plan upper endoscopy  Allergies (Tanisha A. Owens Shark, [place]; 11/20/2016 2:07 PM) Penicillin G Pot in Dextrose *PENICILLINS*  Ceclor *CEPHALOSPORINS*  Allergies Reconciled   Medication History (Tanisha A. Owens Shark, [place]; 11/20/2016 2:07 PM) Protonix (40MG  Tablet DR, 1 (one) Oral daily, Taken starting 05/15/2016) Active. Lexapro (20MG  Tablet, Oral daily) Active. Medications Reconciled  Vitals (Tanisha A. Brown RMA; 11/20/2016 2:06 PM) 11/20/2016 2:04 PM Weight: 235.2 lb Height: 64.5in Body Surface Area: 2.11 m Body Mass Index: 39.75 kg/m  Temp.: 97.50F  Pulse: 100 (Regular)  BP: 118/82 (Sitting, Left Arm, Standard)   Physical Exam  General: Well-appearing no acute  distress Skin: No rash or infection Lungs: Clear equal breath sounds bilaterally Cardiac: Regular rate and rhythm. No edema. Abdomen: Soft and nontender. No palpable masses. No hernias. Extremities: No joint swelling or edema or chronic venous stasis changes Neurologic: Alert and fully oriented. Affect normal. Gait normal.   Assessment & Plan  1.  S/P LAPAROSCOPIC SLEEVE GASTRECTOMY (Z98.84)  Initial weight - 307, BMI - 51.8  Impression: Status post laparoscopic sleeve gastrectomy and repair of hiatal hernia, now 1 year postop with very good weight loss of 71 pounds for BMI of 51.7 to current 39.7. Improvement in joint pain but unfortunately recent significant worsening of reflux and food intolerance. For now we will do her Protonix to 40 twice a day.   Obtain an upper GI series as initial evaluation to look for stricture or recurrent hiatal hernia. I will call her after the study with plans for the next steps. We discussed that ultimately she could even require revision to gastric bypass depending on findings. We will check routine lab and vitamin levels today.  2.  Distal esophageal stricture, esophageal dysmotility  For upper endo 3.  Laparoscopic appendectomy and enterolysis - 07/29/2016 - [facility] - benign appendix 4.  History of anxiety   Alphonsa Overall, MD, [facility] Pager: [phone] Office phone:  [phone]

## 2016-12-14 ENCOUNTER — Encounter (HOSPITAL_COMMUNITY): Payer: Self-pay | Admitting: Certified Registered Nurse Anesthetist

## 2016-12-14 ENCOUNTER — Ambulatory Visit (HOSPITAL_COMMUNITY): Payer: BC Managed Care – PPO | Admitting: Certified Registered Nurse Anesthetist

## 2016-12-14 ENCOUNTER — Encounter (HOSPITAL_COMMUNITY)
Admission: RE | Disposition: A | Discharge status: Home / Self Care | Payer: Self-pay | Source: Ambulatory Visit | Attending: Surgery

## 2016-12-14 ENCOUNTER — Ambulatory Visit (HOSPITAL_COMMUNITY)
Admission: RE | Admit: 2016-12-14 | Discharge: 2016-12-14 | Disposition: A | Discharge status: Home / Self Care | Payer: BC Managed Care – PPO | Source: Ambulatory Visit | Attending: Surgery | Admitting: Surgery

## 2016-12-14 DIAGNOSIS — K224 Dyskinesia of esophagus: Secondary | ICD-10-CM | POA: Diagnosis not present

## 2016-12-14 DIAGNOSIS — Z9884 Bariatric surgery status: Secondary | ICD-10-CM | POA: Diagnosis not present

## 2016-12-14 DIAGNOSIS — K219 Gastro-esophageal reflux disease without esophagitis: Secondary | ICD-10-CM | POA: Insufficient documentation

## 2016-12-14 DIAGNOSIS — F419 Anxiety disorder, unspecified: Secondary | ICD-10-CM | POA: Diagnosis not present

## 2016-12-14 DIAGNOSIS — K2210 Ulcer of esophagus without bleeding: Secondary | ICD-10-CM | POA: Insufficient documentation

## 2016-12-14 HISTORY — PX: ESOPHAGOGASTRODUODENOSCOPY (EGD) WITH PROPOFOL: SHX5813

## 2016-12-14 HISTORY — DX: Malignant (primary) neoplasm, unspecified: C80.1

## 2016-12-14 SURGERY — ESOPHAGOGASTRODUODENOSCOPY (EGD) WITH PROPOFOL
Anesthesia: Monitor Anesthesia Care

## 2016-12-14 MED ORDER — PROPOFOL 10 MG/ML IV BOLUS
INTRAVENOUS | Status: AC
  Filled 2016-12-14: qty 40

## 2016-12-14 MED ORDER — PROPOFOL 10 MG/ML IV BOLUS
INTRAVENOUS | Status: DC | PRN
  Administered 2016-12-14: 60 mg via INTRAVENOUS
  Administered 2016-12-14: 20 mg via INTRAVENOUS
  Administered 2016-12-14: 50 mg via INTRAVENOUS

## 2016-12-14 MED ORDER — PROPOFOL 500 MG/50ML IV EMUL
INTRAVENOUS | Status: DC | PRN
  Administered 2016-12-14: 100 ug/kg/min via INTRAVENOUS

## 2016-12-14 MED ORDER — SUCRALFATE 1 GM/10ML PO SUSP: 1.0000 g | Freq: Three times a day (TID) | ORAL | 0 refills | Status: DC

## 2016-12-14 MED ORDER — MIDAZOLAM HCL 5 MG/5ML IJ SOLN
INTRAMUSCULAR | Status: DC | PRN
  Administered 2016-12-14 (×2): 1 mg via INTRAVENOUS

## 2016-12-14 MED ORDER — LACTATED RINGERS IV SOLN
INTRAVENOUS | Status: DC
Start: 2016-12-14 — End: 2016-12-14
  Administered 2016-12-14: 1000 mL via INTRAVENOUS

## 2016-12-14 MED ORDER — MIDAZOLAM HCL 2 MG/2ML IJ SOLN
INTRAMUSCULAR | Status: AC
  Filled 2016-12-14: qty 2

## 2016-12-14 MED ORDER — LIDOCAINE 2% (20 MG/ML) 5 ML SYRINGE
INTRAMUSCULAR | Status: DC | PRN
  Administered 2016-12-14: 40 mg via INTRAVENOUS
  Administered 2016-12-14: 100 mg via INTRAVENOUS

## 2016-12-14 MED ORDER — SODIUM CHLORIDE 0.9 % IV SOLN: INTRAVENOUS | Status: DC

## 2016-12-14 SURGICAL SUPPLY — 14 items

## 2016-12-14 NOTE — Discharge Instructions (Signed)
Esophagogastroduodenoscopy, Care After Refer to this sheet in the next few weeks. These instructions provide you with information about caring for yourself after your procedure. Your health care provider may also give you more specific instructions. Your treatment has been planned according to current medical practices, but problems sometimes occur. Call your health care provider if you have any problems or questions after your procedure. What can I expect after the procedure? After the procedure, it is common to have:  A sore throat.  Nausea.  Bloating.  Dizziness.  Fatigue.  Follow these instructions at home:  Do not eat or drink anything until the numbing medicine (local anesthetic) has worn off and your gag reflex has returned. You will know that the local anesthetic has worn off when you can swallow comfortably.  Do not drive for 24 hours if you received a medicine to help you relax (sedative).  If your health care provider took a tissue sample for testing during the procedure, make sure to get your test results. This is your responsibility. Ask your health care provider or the department performing the test when your results will be ready.  Keep all follow-up visits as told by your health care provider. This is important. Contact a health care provider if:  You cannot stop coughing.  You are not urinating.  You are urinating less than usual. Get help right away if:  You have trouble swallowing.  You cannot eat or drink.  You have throat or chest pain that gets worse.   This information is not intended to replace advice given to you by your health care provider. Make sure you discuss any questions you have with your health care provider.  [facility] - DISCHARGE INSTRUCTIONS TO PATIENT  Activity:  Driving - May drive tomorrow  Diet:  As tolerated, sleeve diet  Follow up appointment:  Call Dr. Lear Ng office [facility]) at  [phone] for an appointment in 1 to 2 weeks.  Medications and dosages:  Resume your home medications.  You have a prescription for:  Carafate  Call Dr. Excell Seltzer or his office  [phone]) if you have:  Temperature greater than 100.4,  Persistent nausea and vomiting,  Any other questions or concerns you may have after discharge.  In an emergency, call 911 or go to an Emergency Department at a nearby hospital.

## 2016-12-14 NOTE — Op Note (Addendum)
12/14/2016  12:37 PM  PATIENT:  Mary Williams, 41 y.o., female, MRN: [id number]  PREOP DIAGNOSIS:  History of sleeve gastrectomy, chronic reflux, questionable stricture  POSTOP DIAGNOSIS:   History of sleeve gastrectomy, chronic reflux, proximal esophageal erosions [photos in paper chart]  PROCEDURE:  Esophagogastroduodenoscopy  SURGEON:   Alphonsa Overall, M.D.  ANESTHESIA:   Propofol by anesthesia  Anesthesiologist: Rica Koyanagi, MD CRNA: Deliah Boston, CRNA  INDICATIONS FOR PROCEDURE:  [name] is a 41 y.o. (DOB: 11-03-1975)  white female whose primary care physician is Darrol Jump, PA-C and comes for upper endoscopy to evaluate worsening reflux after a sleeve gastrectomy.  The patient had a gastric sleeve on 11/12/2015 by Dr. Excell Seltzer.   The indications and risks of the endoscopy were explained to the patient.  The risks include, but are not limited to, perforation, bleeding, or injury to the bowel.  If balloon dilatation is needed, the risk of perforation is higher.  PROCEDURE:  The patient was in room 4 at [facility] endoscopy unit.  The patient was monitored with a pulse oximetry, BP cuff, and EKG.  The patient has nasal O2 flowing during the procedure.   Anesthesia was supervied by Anesthesiologist: Rica Koyanagi, MD CRNA: Deliah Boston, CRNA   A flexible Pentax endoscope was passed down the throat without difficulty.   Findings include:   Esophagus:   Proximal esophageal erosions, at the cricopharyngeal muscle (18 cm)   GE junction at:  35 cm, no evidence of stricture   Stomach pouch: Some angulation about 5 cm beyond the EG junction, but not strictured and the scope passes easily through the angulation.   Staple line of sleeve:   The staple line is healed, looks good, no ulcer   Pylorus:  Normal   Duodenum:  Normal to second portion of the duodenum   CLO test:  Obtained  PLAN:   Photos taken and given to patient.    Carafate QID  See Dr. Excell Seltzer in 1  to 2 weeks.     Alphonsa Overall, MD, [facility] Pager: [phone] Office phone:  [phone]

## 2016-12-14 NOTE — Anesthesia Preprocedure Evaluation (Signed)
Anesthesia Evaluation  Patient identified by MRN, date of birth, ID band Patient awake    Reviewed: Allergy & Precautions, NPO status , Patient's Chart, lab work & pertinent test results  Airway Mallampati: I  TM Distance: >3 FB Neck ROM: Full    Dental no notable dental hx.    Pulmonary neg pulmonary ROS,    breath sounds clear to auscultation       Cardiovascular negative cardio ROS   Rhythm:Regular Rate:Normal     Neuro/Psych negative neurological ROS     GI/Hepatic hiatal hernia, GERD  ,  Endo/Other  negative endocrine ROS  Renal/GU negative Renal ROS     Musculoskeletal negative musculoskeletal ROS (+)   Abdominal   Peds  Hematology negative hematology ROS (+)   Anesthesia Other Findings   Reproductive/Obstetrics                             Anesthesia Physical Anesthesia Plan  ASA: II  Anesthesia Plan: MAC   Post-op Pain Management:    Induction: Intravenous  PONV Risk Score and Plan: 2 and Ondansetron, Dexamethasone and Treatment may vary due to age or medical condition  Airway Management Planned: Natural Airway and Nasal Cannula  Additional Equipment:   Intra-op Plan:   Post-operative Plan:   Informed Consent: I have reviewed the patients History and Physical, chart, labs and discussed the procedure including the risks, benefits and alternatives for the proposed anesthesia with the patient or authorized representative who has indicated his/her understanding and acceptance.     Plan Discussed with: CRNA  Anesthesia Plan Comments:         Anesthesia Quick Evaluation

## 2016-12-14 NOTE — Anesthesia Procedure Notes (Signed)
Procedure Name: MAC Date/Time: 12/14/2016 12:20 PM Performed by: Deliah Boston Pre-anesthesia Checklist: Patient identified, Emergency Drugs available, Suction available and Patient being monitored Patient Re-evaluated:Patient Re-evaluated prior to induction Oxygen Delivery Method: Simple face mask Preoxygenation: Pre-oxygenation with 100% oxygen Placement Confirmation: positive ETCO2

## 2016-12-14 NOTE — Transfer of Care (Signed)
Immediate Anesthesia Transfer of Care Note  Patient: MIMI DEBELLIS  Procedure(s) Performed: Procedure(s): ESOPHAGOGASTRODUODENOSCOPY (EGD) WITH PROPOFOL WITH POSSIBLE DILATATION (N/A)  Patient Location: PACU  Anesthesia Type:MAC  Level of Consciousness: Patient easily awoken, sedated, comfortable, cooperative, following commands, responds to stimulation.   Airway & Oxygen Therapy: Patient spontaneously breathing, ventilating well, oxygen via simple oxygen mask.  Post-op Assessment: Report given to PACU RN, vital signs reviewed and stable, moving all extremities.   Post vital signs: Reviewed and stable.  Complications: No apparent anesthesia complications Last Vitals:  Vitals:   12/14/16 1106  BP: 112/60  Pulse: 63  Resp: 15  Temp: 36.8 C  SpO2: 100%    Last Pain:  Vitals:   12/14/16 1106  TempSrc: Oral         Complications: No apparent anesthesia complications

## 2016-12-14 NOTE — Anesthesia Postprocedure Evaluation (Signed)
Anesthesia Post Note  Patient: Mary Williams  Procedure(s) Performed: ESOPHAGOGASTRODUODENOSCOPY (EGD) WITH PROPOFOL WITH POSSIBLE DILATATION (N/A )     Patient location during evaluation: Endoscopy Anesthesia Type: MAC Level of consciousness: awake and alert Pain management: pain level controlled Vital Signs Assessment: post-procedure vital signs reviewed and stable Respiratory status: spontaneous breathing, nonlabored ventilation, respiratory function stable and patient connected to nasal cannula oxygen Cardiovascular status: stable and blood pressure returned to baseline Postop Assessment: no apparent nausea or vomiting Anesthetic complications: no    Last Vitals:  Vitals:   12/14/16 1250 12/14/16 1300  BP: 124/70 136/81  Pulse: 82 76  Resp: 13 18  Temp:    SpO2: 100% 100%    Last Pain:  Vitals:   12/14/16 1238  TempSrc: Oral                 Dimond Crotty,JAMES TERRILL

## 2016-12-14 NOTE — Interval H&P Note (Signed)
History and Physical Interval Note:  12/14/2016 12:10 PM  Mary Williams  has presented today for surgery, with the diagnosis of history of sleeve gastrectomy, esophageal stricture  The various methods of treatment have been discussed with the patient and family.  Husband and daughter at bedside.  After consideration of risks, benefits and other options for treatment, the patient has consented to  Procedure(s): ESOPHAGOGASTRODUODENOSCOPY (EGD) WITH PROPOFOL WITH POSSIBLE DILATATION (N/A) as a surgical intervention .  The patient's history has been reviewed, patient examined, no change in status, stable for surgery.  I have reviewed the patient's chart and labs.  Questions were answered to the patient's satisfaction.     Daymon Hora H

## 2016-12-15 LAB — CLOTEST (H. PYLORI), BIOPSY: Helicobacter screen: NEGATIVE

## 2016-12-16 ENCOUNTER — Encounter (HOSPITAL_COMMUNITY): Payer: Self-pay | Admitting: Surgery

## 2016-12-21 NOTE — Progress Notes (Signed)
Please place orders in EPIC as patient is being scheduled for a pre-op appointment! Thank you! 

## 2016-12-23 NOTE — Progress Notes (Signed)
Please place orders in EPIC as patient has a pre-op appointment on 12/28/2016!

## 2016-12-24 ENCOUNTER — Ambulatory Visit: Payer: Self-pay | Admitting: General Surgery

## 2016-12-24 NOTE — Patient Instructions (Addendum)
Mary Williams  12/24/2016   Your procedure is scheduled on: Tuesday, Nov. 6, 2018   Report to [facility] Main  Entrance   Take [place]  elevators to 3rd floor to  [place] at 5:30 AM.    Call this number if you have problems the morning of surgery [phone]    Remember: ONLY 1 PERSON MAY GO WITH YOU TO SHORT STAY TO GET  READY MORNING OF [place].   Do not eat food or drink liquids :After Midnight.    Take these medicines the morning of surgery with A SIP OF WATER: Pantoprazole (Protonix)                               You may not have any metal on your body including hair clips, bobby pins, jewelry, and  piercings             Do not wear make-up, lotions, powders or perfumes, deodorant             Do not wear nail polish.  Do not shave  48 hours prior to surgery.               Do not bring valuables to the hospital. [place].   Contacts, dentures or bridgework may not be worn into surgery.   Leave suitcase in the car. After surgery it may be brought to your room.              Please read over the following fact sheets you were given: _____________________________________________________________________             [facility] - Preparing for Surgery Before surgery, you can play an important role.  Because skin is not sterile, your skin needs to be as free of germs as possible.  You can reduce the number of germs on your skin by washing with CHG (chlorahexidine gluconate) soap before surgery.  CHG is an antiseptic cleaner which kills germs and bonds with the skin to continue killing germs even after washing. Please DO NOT use if you have an allergy to CHG or antibacterial soaps.  If your skin becomes reddened/irritated stop using the CHG and inform your nurse when you arrive at Short Stay. Do not shave (including legs and underarms) for at least 48 hours prior to the first CHG shower.  You  may shave your face/neck.  Please follow these instructions carefully:  1.  Shower with CHG Soap the night before surgery and the  morning of Surgery.  2.  If you choose to wash your hair, wash your hair first as usual with your  normal  shampoo.  3.  After you shampoo, rinse your hair and body thoroughly to remove the  shampoo.                             4.  Use CHG as you would any other liquid soap.  You can apply chg directly  to the skin and wash                       Gently with a scrungie or clean washcloth.  5.  Apply the CHG Soap to your body ONLY FROM THE NECK DOWN.   Do not use on face/ open                           Wound or open sores. Avoid contact with eyes, ears mouth and genitals (private parts).                       Wash face,  Genitals (private parts) with your normal soap.             6.  Wash thoroughly, paying special attention to the area where your surgery  will be performed.  7.  Thoroughly rinse your body with warm water from the neck down.  8.  DO NOT shower/wash with your normal soap after using and rinsing off  the CHG Soap.                9.  Pat yourself dry with a clean towel.            10.  Wear clean pajamas.            11.  Place clean sheets on your bed the night of your first shower and do not  sleep with pets. Day of Surgery : Do not apply any lotions/deodorants the morning of surgery.  Please wear clean clothes to the hospital/surgery center.  FAILURE TO FOLLOW THESE INSTRUCTIONS MAY RESULT IN THE CANCELLATION OF YOUR SURGERY  PATIENT SIGNATURE_________________________________  NURSE SIGNATURE__________________________________  ________________________________________________________________________   Mary Williams  An incentive spirometer is a tool that can help keep your lungs clear and active. This tool measures how well you are filling your lungs with each breath. Taking long deep breaths may help reverse or decrease the chance of  developing breathing (pulmonary) problems (especially infection) following:  A long period of time when you are unable to move or be active. BEFORE THE PROCEDURE   If the spirometer includes an indicator to show your best effort, your nurse or respiratory therapist will set it to a desired goal.  If possible, sit up straight or lean slightly forward. Try not to slouch.  Hold the incentive spirometer in an upright position. INSTRUCTIONS FOR USE  1. Sit on the edge of your bed if possible, or sit up as far as you can in bed or on a chair. 2. Hold the incentive spirometer in an upright position. 3. Breathe out normally. 4. Place the mouthpiece in your mouth and seal your lips tightly around it. 5. Breathe in slowly and as deeply as possible, raising the piston or the ball toward the top of the column. 6. Hold your breath for 3-5 seconds or for as long as possible. Allow the piston or ball to fall to the bottom of the column. 7. Remove the mouthpiece from your mouth and breathe out normally. 8. Rest for a few seconds and repeat Steps 1 through 7 at least 10 times every 1-2 hours when you are awake. Take your time and take a few normal breaths between deep breaths. 9. The spirometer may include an indicator to show your best effort. Use the indicator as a goal to work toward during each repetition. 10. After each set of 10 deep breaths, practice coughing to be sure your lungs are clear. If you have an incision (the cut made at the time of surgery), support your incision when coughing by placing a pillow  or rolled up towels firmly against it. Once you are able to get out of bed, walk around indoors and cough well. You may stop using the incentive spirometer when instructed by your caregiver.  RISKS AND COMPLICATIONS  Take your time so you do not get dizzy or light-headed.  If you are in pain, you may need to take or ask for pain medication before doing incentive spirometry. It is harder to take a  deep breath if you are having pain. AFTER USE  Rest and breathe slowly and easily.  It can be helpful to keep track of a log of your progress. Your caregiver can provide you with a simple table to help with this. If you are using the spirometer at home, follow these instructions: [place] IF:   You are having difficultly using the spirometer.  You have trouble using the spirometer as often as instructed.  Your pain medication is not giving enough relief while using the spirometer.  You develop fever of 100.5 F (38.1 C) or higher. SEEK IMMEDIATE MEDICAL CARE IF:   You cough up bloody sputum that had not been present before.  You develop fever of 102 F (38.9 C) or greater.  You develop worsening pain at or near the incision site. MAKE SURE YOU:   Understand these instructions.  Will watch your condition.  Will get help right away if you are not doing well or get worse. Document Released: 06/22/2006 Document Revised: 05/04/2011 Document Reviewed: 08/23/2006 [facility] Patient Information 2014 [place], [place].   ________________________________________________________________________

## 2016-12-24 NOTE — Progress Notes (Signed)
Please place orders in EPIC as patient has a pre-op appointment on 12/28/2016! Thank you!

## 2016-12-24 NOTE — Pre-Procedure Instructions (Signed)
The following is in epic: Endo 12/15/16 KUB 11/27/16

## 2016-12-25 ENCOUNTER — Telehealth: Payer: Self-pay | Admitting: Skilled Nursing Facility1

## 2016-12-25 NOTE — Telephone Encounter (Signed)
Returned call to pt, LVM.

## 2016-12-28 ENCOUNTER — Ambulatory Visit (HOSPITAL_COMMUNITY)
Admission: RE | Admit: 2016-12-28 | Discharge: 2016-12-28 | Disposition: A | Discharge status: Home / Self Care | Payer: BC Managed Care – PPO | Source: Ambulatory Visit | Attending: General Surgery | Admitting: General Surgery

## 2016-12-28 ENCOUNTER — Encounter (HOSPITAL_COMMUNITY)
Admission: RE | Admit: 2016-12-28 | Discharge: 2016-12-28 | Disposition: A | Discharge status: Home / Self Care | Payer: BC Managed Care – PPO | Source: Ambulatory Visit | Attending: General Surgery | Admitting: General Surgery

## 2016-12-28 ENCOUNTER — Encounter (HOSPITAL_COMMUNITY): Payer: Self-pay

## 2016-12-28 ENCOUNTER — Other Ambulatory Visit: Payer: Self-pay

## 2016-12-28 DIAGNOSIS — Z01818 Encounter for other preprocedural examination: Secondary | ICD-10-CM | POA: Insufficient documentation

## 2016-12-28 DIAGNOSIS — R9431 Abnormal electrocardiogram [ECG] [EKG]: Secondary | ICD-10-CM

## 2016-12-28 LAB — COMPREHENSIVE METABOLIC PANEL
ALBUMIN: 3.8 g/dL (ref 3.5–5.0)
ALK PHOS: 63 U/L (ref 38–126)
ALT: 17 U/L (ref 14–54)
ANION GAP: 8 (ref 5–15)
AST: 22 U/L (ref 15–41)
BILIRUBIN TOTAL: 0.5 mg/dL (ref 0.3–1.2)
BUN: 12 mg/dL (ref 6–20)
CALCIUM: 8.9 mg/dL (ref 8.9–10.3)
CO2: 28 mmol/L (ref 22–32)
Chloride: 106 mmol/L (ref 101–111)
Creatinine, Ser: 0.7 mg/dL (ref 0.44–1.00)
GFR calc non Af Amer: >60 mL/min (ref >60)
GLUCOSE: 92 mg/dL (ref 65–99)
POTASSIUM: 4.4 mmol/L (ref 3.5–5.1)
SODIUM: 142 mmol/L (ref 135–145)
TOTAL PROTEIN: 7.1 g/dL (ref 6.5–8.1)

## 2016-12-28 LAB — CBC WITH DIFFERENTIAL/PLATELET
Basophils Absolute: 0.1 10*3/uL (ref 0.0–0.1)
Basophils Relative: 1 %
EOS ABS: 0.2 10*3/uL (ref 0.0–0.7)
EOS PCT: 2 %
HCT: 40.9 % (ref 36.0–46.0)
Hemoglobin: 13.6 g/dL (ref 12.0–15.0)
LYMPHS ABS: 3.7 10*3/uL (ref 0.7–4.0)
LYMPHS PCT: 37 %
MCH: 29 pg (ref 26.0–34.0)
MCHC: 33.3 g/dL (ref 30.0–36.0)
MCV: 87.2 fL (ref 78.0–100.0)
MONO ABS: 0.7 10*3/uL (ref 0.1–1.0)
MONOS PCT: 8 %
Neutro Abs: 5.2 10*3/uL (ref 1.7–7.7)
Neutrophils Relative %: 52 %
Platelets: 355 10*3/uL (ref 150–400)
RBC: 4.69 MIL/uL (ref 3.87–5.11)
RDW: 13.3 % (ref 11.5–15.5)
WBC: 9.9 10*3/uL (ref 4.0–10.5)

## 2016-12-29 ENCOUNTER — Inpatient Hospital Stay (HOSPITAL_COMMUNITY): Payer: BC Managed Care – PPO | Admitting: Anesthesiology

## 2016-12-29 ENCOUNTER — Inpatient Hospital Stay (HOSPITAL_COMMUNITY)
Admission: RE | Admit: 2016-12-29 | Discharge: 2017-01-01 | DRG: 327 | Disposition: A | Discharge status: Home / Self Care | Payer: BC Managed Care – PPO | Source: Ambulatory Visit | Attending: General Surgery | Admitting: General Surgery

## 2016-12-29 ENCOUNTER — Other Ambulatory Visit: Payer: Self-pay

## 2016-12-29 ENCOUNTER — Encounter (HOSPITAL_COMMUNITY): Payer: Self-pay | Admitting: *Deleted

## 2016-12-29 ENCOUNTER — Encounter (HOSPITAL_COMMUNITY)
Admission: RE | Disposition: A | Discharge status: Home / Self Care | Payer: Self-pay | Source: Ambulatory Visit | Attending: General Surgery

## 2016-12-29 DIAGNOSIS — F419 Anxiety disorder, unspecified: Secondary | ICD-10-CM | POA: Diagnosis present

## 2016-12-29 DIAGNOSIS — K219 Gastro-esophageal reflux disease without esophagitis: Secondary | ICD-10-CM | POA: Diagnosis present

## 2016-12-29 DIAGNOSIS — Z6841 Body Mass Index (BMI) 40.0 and over, adult: Secondary | ICD-10-CM

## 2016-12-29 DIAGNOSIS — Z9884 Bariatric surgery status: Secondary | ICD-10-CM

## 2016-12-29 DIAGNOSIS — Z880 Allergy status to penicillin: Secondary | ICD-10-CM | POA: Diagnosis not present

## 2016-12-29 DIAGNOSIS — K2210 Ulcer of esophagus without bleeding: Principal | ICD-10-CM | POA: Diagnosis present

## 2016-12-29 DIAGNOSIS — Z79899 Other long term (current) drug therapy: Secondary | ICD-10-CM | POA: Diagnosis not present

## 2016-12-29 DIAGNOSIS — K2100 Gastro-esophageal reflux disease with esophagitis, without bleeding: Secondary | ICD-10-CM | POA: Diagnosis present

## 2016-12-29 DIAGNOSIS — L209 Atopic dermatitis, unspecified: Secondary | ICD-10-CM | POA: Diagnosis present

## 2016-12-29 DIAGNOSIS — K449 Diaphragmatic hernia without obstruction or gangrene: Secondary | ICD-10-CM | POA: Diagnosis present

## 2016-12-29 DIAGNOSIS — Z90710 Acquired absence of both cervix and uterus: Secondary | ICD-10-CM | POA: Diagnosis not present

## 2016-12-29 DIAGNOSIS — Z881 Allergy status to other antibiotic agents status: Secondary | ICD-10-CM | POA: Diagnosis not present

## 2016-12-29 HISTORY — PX: UPPER GI ENDOSCOPY: SHX6162

## 2016-12-29 HISTORY — PX: LAPAROSCOPIC ROUX-EN-Y GASTRIC BYPASS WITH HIATAL HERNIA REPAIR: SHX6513

## 2016-12-29 LAB — HEMOGLOBIN AND HEMATOCRIT, BLOOD
HEMATOCRIT: 41.5 % (ref 36.0–46.0)
HEMOGLOBIN: 14 g/dL (ref 12.0–15.0)

## 2016-12-29 SURGERY — CREATION, GASTRIC BYPASS, LAPAROSCOPIC, USING ROUX-EN-Y GASTROENTEROSTOMY, WITH HIATAL HERNIA REPAIR
Anesthesia: General | Site: Abdomen

## 2016-12-29 MED ORDER — LIP MEDEX EX OINT
TOPICAL_OINTMENT | CUTANEOUS | Status: AC
  Filled 2016-12-29: qty 7

## 2016-12-29 MED ORDER — DEXAMETHASONE SODIUM PHOSPHATE 10 MG/ML IJ SOLN
INTRAMUSCULAR | Status: DC | PRN
  Administered 2016-12-29: 10 mg via INTRAVENOUS

## 2016-12-29 MED ORDER — SODIUM CHLORIDE 0.9 % IJ SOLN
INTRAMUSCULAR | Status: AC
  Filled 2016-12-29: qty 50

## 2016-12-29 MED ORDER — GLYCOPYRROLATE 0.2 MG/ML IV SOSY
PREFILLED_SYRINGE | INTRAVENOUS | Status: DC | PRN
  Administered 2016-12-29: .2 mg via INTRAVENOUS

## 2016-12-29 MED ORDER — ONDANSETRON HCL 4 MG/2ML IJ SOLN
INTRAMUSCULAR | Status: AC
  Filled 2016-12-29: qty 2

## 2016-12-29 MED ORDER — LEVOFLOXACIN IN D5W 750 MG/150ML IV SOLN
INTRAVENOUS | Status: AC
  Filled 2016-12-29: qty 150

## 2016-12-29 MED ORDER — SODIUM CHLORIDE 0.9 % IJ SOLN
INTRAMUSCULAR | Status: AC
  Filled 2016-12-29: qty 10

## 2016-12-29 MED ORDER — PROPOFOL 10 MG/ML IV BOLUS
INTRAVENOUS | Status: DC | PRN
  Administered 2016-12-29: 170 mg via INTRAVENOUS

## 2016-12-29 MED ORDER — PHENYLEPHRINE 40 MCG/ML (10ML) SYRINGE FOR IV PUSH (FOR BLOOD PRESSURE SUPPORT)
PREFILLED_SYRINGE | INTRAVENOUS | Status: DC | PRN
  Administered 2016-12-29 (×2): 40 ug via INTRAVENOUS
  Administered 2016-12-29: 80 ug via INTRAVENOUS

## 2016-12-29 MED ORDER — ROCURONIUM BROMIDE 50 MG/5ML IV SOSY
PREFILLED_SYRINGE | INTRAVENOUS | Status: AC
Start: 2016-12-29 — End: ?
  Filled 2016-12-29: qty 5

## 2016-12-29 MED ORDER — PHENYLEPHRINE 40 MCG/ML (10ML) SYRINGE FOR IV PUSH (FOR BLOOD PRESSURE SUPPORT)
PREFILLED_SYRINGE | INTRAVENOUS | Status: AC
  Filled 2016-12-29: qty 10

## 2016-12-29 MED ORDER — ESMOLOL HCL 100 MG/10ML IV SOLN
INTRAVENOUS | Status: AC
  Filled 2016-12-29: qty 10

## 2016-12-29 MED ORDER — BUPIVACAINE-EPINEPHRINE 0.25% -1:200000 IJ SOLN
INTRAMUSCULAR | Status: DC | PRN
  Administered 2016-12-29: 50 mL

## 2016-12-29 MED ORDER — INFLUENZA VAC SPLIT QUAD 0.5 ML IM SUSY
0.5000 mL | PREFILLED_SYRINGE | INTRAMUSCULAR | Status: DC
  Filled 2016-12-29: qty 0.5

## 2016-12-29 MED ORDER — GLYCOPYRROLATE 0.2 MG/ML IV SOSY
PREFILLED_SYRINGE | INTRAVENOUS | Status: AC
  Filled 2016-12-29: qty 5

## 2016-12-29 MED ORDER — LIDOCAINE 2% (20 MG/ML) 5 ML SYRINGE
INTRAMUSCULAR | Status: AC
  Filled 2016-12-29: qty 5

## 2016-12-29 MED ORDER — BUPIVACAINE-EPINEPHRINE 0.25% -1:200000 IJ SOLN
INTRAMUSCULAR | Status: AC
  Filled 2016-12-29: qty 1

## 2016-12-29 MED ORDER — PROMETHAZINE HCL 25 MG/ML IJ SOLN
6.2500 mg | INTRAMUSCULAR | Status: DC | PRN
  Administered 2016-12-29: 6.25 mg via INTRAVENOUS

## 2016-12-29 MED ORDER — SCOPOLAMINE 1 MG/3DAYS TD PT72
1.0000 | MEDICATED_PATCH | TRANSDERMAL | Status: DC
  Administered 2016-12-29: 1.5 mg via TRANSDERMAL
  Filled 2016-12-29: qty 1

## 2016-12-29 MED ORDER — MORPHINE SULFATE (PF) 4 MG/ML IV SOLN
1.0000 mg | INTRAVENOUS | Status: DC | PRN
  Administered 2016-12-29 (×3): 2 mg via INTRAVENOUS
  Administered 2016-12-29: 1 mg via INTRAVENOUS
  Administered 2016-12-31 – 2017-01-01 (×2): 2 mg via INTRAVENOUS
  Filled 2016-12-29 (×4): qty 1

## 2016-12-29 MED ORDER — KETAMINE HCL 10 MG/ML IJ SOLN
INTRAMUSCULAR | Status: DC | PRN
  Administered 2016-12-29: 20 mg via INTRAVENOUS
  Administered 2016-12-29: 10 mg via INTRAVENOUS

## 2016-12-29 MED ORDER — ESMOLOL HCL 100 MG/10ML IV SOLN
INTRAVENOUS | Status: DC | PRN
  Administered 2016-12-29 (×6): 10 mg via INTRAVENOUS

## 2016-12-29 MED ORDER — PROMETHAZINE HCL 25 MG/ML IJ SOLN
INTRAMUSCULAR | Status: AC
  Filled 2016-12-29: qty 1

## 2016-12-29 MED ORDER — MIDAZOLAM HCL 5 MG/5ML IJ SOLN
INTRAMUSCULAR | Status: DC | PRN
  Administered 2016-12-29: 2 mg via INTRAVENOUS

## 2016-12-29 MED ORDER — FENTANYL CITRATE (PF) 100 MCG/2ML IJ SOLN
25.0000 ug | INTRAMUSCULAR | Status: DC | PRN
  Administered 2016-12-29 (×2): 50 ug via INTRAVENOUS

## 2016-12-29 MED ORDER — STERILE WATER FOR IRRIGATION IR SOLN
Status: DC | PRN
  Administered 2016-12-29: 1000 mL

## 2016-12-29 MED ORDER — ACETAMINOPHEN 500 MG PO TABS
1000.0000 mg | ORAL_TABLET | ORAL | Status: AC
  Administered 2016-12-29: 1000 mg via ORAL
  Filled 2016-12-29: qty 2

## 2016-12-29 MED ORDER — CHLORHEXIDINE GLUCONATE CLOTH 2 % EX PADS: 6.0000 | MEDICATED_PAD | Freq: Once | CUTANEOUS | Status: DC

## 2016-12-29 MED ORDER — LIDOCAINE HCL 2 % IJ SOLN
INTRAMUSCULAR | Status: AC
  Filled 2016-12-29: qty 20

## 2016-12-29 MED ORDER — KETAMINE HCL-SODIUM CHLORIDE 100-0.9 MG/10ML-% IV SOSY
PREFILLED_SYRINGE | INTRAVENOUS | Status: AC
  Filled 2016-12-29: qty 10

## 2016-12-29 MED ORDER — SODIUM CHLORIDE 0.9 % IJ SOLN
INTRAMUSCULAR | Status: DC | PRN
Start: 2016-12-29 — End: 2016-12-29
  Administered 2016-12-29: 60 mL via INTRAVENOUS

## 2016-12-29 MED ORDER — LIDOCAINE 2% (20 MG/ML) 5 ML SYRINGE
INTRAMUSCULAR | Status: DC | PRN
  Administered 2016-12-29: 80 mg via INTRAVENOUS

## 2016-12-29 MED ORDER — ENOXAPARIN SODIUM 30 MG/0.3ML ~~LOC~~ SOLN
30.0000 mg | Freq: Two times a day (BID) | SUBCUTANEOUS | Status: DC
  Administered 2016-12-29 – 2017-01-01 (×6): 30 mg via SUBCUTANEOUS
  Filled 2016-12-29 (×6): qty 0.3

## 2016-12-29 MED ORDER — APREPITANT 40 MG PO CAPS
40.0000 mg | ORAL_CAPSULE | ORAL | Status: AC
  Administered 2016-12-29: 40 mg via ORAL
  Filled 2016-12-29: qty 1

## 2016-12-29 MED ORDER — EVICEL 5 ML EX KIT
PACK | Freq: Once | CUTANEOUS | Status: DC
  Filled 2016-12-29: qty 1

## 2016-12-29 MED ORDER — HEPARIN SODIUM (PORCINE) 5000 UNIT/ML IJ SOLN
5000.0000 [IU] | INTRAMUSCULAR | Status: AC
  Administered 2016-12-29: 5000 [IU] via SUBCUTANEOUS
  Filled 2016-12-29: qty 1

## 2016-12-29 MED ORDER — PROPOFOL 10 MG/ML IV BOLUS
INTRAVENOUS | Status: AC
  Filled 2016-12-29: qty 20

## 2016-12-29 MED ORDER — ONDANSETRON HCL 4 MG/2ML IJ SOLN
INTRAMUSCULAR | Status: DC | PRN
  Administered 2016-12-29: 4 mg via INTRAVENOUS

## 2016-12-29 MED ORDER — ROCURONIUM BROMIDE 50 MG/5ML IV SOSY
PREFILLED_SYRINGE | INTRAVENOUS | Status: AC
  Filled 2016-12-29: qty 10

## 2016-12-29 MED ORDER — PREMIER PROTEIN SHAKE
2.0000 [oz_av] | ORAL | Status: DC
  Administered 2016-12-30 – 2017-01-01 (×12): 2 [oz_av] via ORAL

## 2016-12-29 MED ORDER — DIPHENHYDRAMINE HCL 50 MG/ML IJ SOLN
INTRAMUSCULAR | Status: AC
  Filled 2016-12-29: qty 1

## 2016-12-29 MED ORDER — DEXAMETHASONE SODIUM PHOSPHATE 10 MG/ML IJ SOLN
INTRAMUSCULAR | Status: AC
  Filled 2016-12-29: qty 1

## 2016-12-29 MED ORDER — FENTANYL CITRATE (PF) 100 MCG/2ML IJ SOLN
INTRAMUSCULAR | Status: AC
  Filled 2016-12-29: qty 2

## 2016-12-29 MED ORDER — PANTOPRAZOLE SODIUM 40 MG IV SOLR
40.0000 mg | Freq: Every day | INTRAVENOUS | Status: DC
  Administered 2016-12-29 – 2016-12-31 (×3): 40 mg via INTRAVENOUS
  Filled 2016-12-29 (×3): qty 40

## 2016-12-29 MED ORDER — STERILE WATER FOR IRRIGATION IR SOLN
Status: DC | PRN
  Administered 2016-12-29: 500 mL

## 2016-12-29 MED ORDER — LEVOFLOXACIN IN D5W 750 MG/150ML IV SOLN
750.0000 mg | INTRAVENOUS | Status: AC
  Administered 2016-12-29: 750 mg via INTRAVENOUS

## 2016-12-29 MED ORDER — SUGAMMADEX SODIUM 200 MG/2ML IV SOLN
INTRAVENOUS | Status: AC
  Filled 2016-12-29: qty 2

## 2016-12-29 MED ORDER — MIDAZOLAM HCL 2 MG/2ML IJ SOLN
INTRAMUSCULAR | Status: AC
  Filled 2016-12-29: qty 2

## 2016-12-29 MED ORDER — BUPIVACAINE LIPOSOME 1.3 % IJ SUSP
20.0000 mL | Freq: Once | INTRAMUSCULAR | Status: DC
  Filled 2016-12-29: qty 20

## 2016-12-29 MED ORDER — LACTATED RINGERS IR SOLN
Status: DC | PRN
  Administered 2016-12-29: 1000 mL

## 2016-12-29 MED ORDER — HYDROMORPHONE HCL 1 MG/ML IJ SOLN
INTRAMUSCULAR | Status: AC
  Filled 2016-12-29: qty 2

## 2016-12-29 MED ORDER — HYDROMORPHONE HCL 1 MG/ML IJ SOLN: 0.2500 mg | INTRAMUSCULAR | Status: DC | PRN

## 2016-12-29 MED ORDER — KCL IN DEXTROSE-NACL 20-5-0.9 MEQ/L-%-% IV SOLN
INTRAVENOUS | Status: DC
  Administered 2016-12-29 – 2017-01-01 (×7): via INTRAVENOUS
  Filled 2016-12-29 (×9): qty 1000

## 2016-12-29 MED ORDER — OXYCODONE HCL 5 MG/5ML PO SOLN
5.0000 mg | ORAL | Status: DC | PRN
  Administered 2016-12-30 (×2): 5 mg via ORAL
  Administered 2016-12-30: 10 mg via ORAL
  Administered 2016-12-30: 5 mg via ORAL
  Administered 2016-12-30 – 2016-12-31 (×3): 10 mg via ORAL
  Administered 2016-12-31: 5 mg via ORAL
  Administered 2017-01-01 (×2): 10 mg via ORAL
  Filled 2016-12-29: qty 5
  Filled 2016-12-29 (×3): qty 10
  Filled 2016-12-29 (×2): qty 5
  Filled 2016-12-29: qty 10
  Filled 2016-12-29: qty 5
  Filled 2016-12-29 (×2): qty 10

## 2016-12-29 MED ORDER — MORPHINE SULFATE (PF) 4 MG/ML IV SOLN
INTRAVENOUS | Status: AC
  Filled 2016-12-29: qty 1

## 2016-12-29 MED ORDER — FENTANYL CITRATE (PF) 100 MCG/2ML IJ SOLN
INTRAMUSCULAR | Status: AC
Start: 2016-12-29 — End: 2016-12-29
  Filled 2016-12-29: qty 4

## 2016-12-29 MED ORDER — SUGAMMADEX SODIUM 500 MG/5ML IV SOLN
INTRAVENOUS | Status: DC | PRN
  Administered 2016-12-29: 424.8 mg via INTRAVENOUS

## 2016-12-29 MED ORDER — EVICEL 5 ML EX KIT
PACK | CUTANEOUS | Status: DC | PRN
  Administered 2016-12-29: 1

## 2016-12-29 MED ORDER — MEPERIDINE HCL 50 MG/ML IJ SOLN: 6.2500 mg | INTRAMUSCULAR | Status: DC | PRN

## 2016-12-29 MED ORDER — OXYCODONE HCL 5 MG PO TABS: 5.0000 mg | ORAL_TABLET | Freq: Once | ORAL | Status: DC | PRN

## 2016-12-29 MED ORDER — DEXAMETHASONE SODIUM PHOSPHATE 4 MG/ML IJ SOLN: 4.0000 mg | INTRAMUSCULAR | Status: DC

## 2016-12-29 MED ORDER — ROCURONIUM BROMIDE 10 MG/ML (PF) SYRINGE
PREFILLED_SYRINGE | INTRAVENOUS | Status: DC | PRN
  Administered 2016-12-29: 15 mg via INTRAVENOUS
  Administered 2016-12-29: 50 mg via INTRAVENOUS
  Administered 2016-12-29: 15 mg via INTRAVENOUS
  Administered 2016-12-29 (×2): 10 mg via INTRAVENOUS
  Administered 2016-12-29: 20 mg via INTRAVENOUS

## 2016-12-29 MED ORDER — OXYCODONE HCL 5 MG/5ML PO SOLN
5.0000 mg | Freq: Once | ORAL | Status: DC | PRN
  Filled 2016-12-29: qty 5

## 2016-12-29 MED ORDER — FENTANYL CITRATE (PF) 100 MCG/2ML IJ SOLN
INTRAMUSCULAR | Status: DC | PRN
  Administered 2016-12-29 (×2): 50 ug via INTRAVENOUS
  Administered 2016-12-29: 100 ug via INTRAVENOUS

## 2016-12-29 MED ORDER — BUPIVACAINE LIPOSOME 1.3 % IJ SUSP
INTRAMUSCULAR | Status: DC | PRN
Start: 2016-12-29 — End: 2016-12-29
  Administered 2016-12-29: 20 mL

## 2016-12-29 MED ORDER — LACTATED RINGERS IV SOLN
INTRAVENOUS | Status: DC | PRN
  Administered 2016-12-29 (×3): via INTRAVENOUS

## 2016-12-29 MED ORDER — ACETAMINOPHEN 160 MG/5ML PO SOLN
650.0000 mg | ORAL | Status: DC | PRN
  Administered 2016-12-30 (×3): 650 mg via ORAL
  Filled 2016-12-29 (×4): qty 20.3

## 2016-12-29 MED ORDER — GABAPENTIN 300 MG PO CAPS
300.0000 mg | ORAL_CAPSULE | ORAL | Status: AC
Start: 2016-12-29 — End: 2016-12-29
  Administered 2016-12-29: 300 mg via ORAL
  Filled 2016-12-29: qty 1

## 2016-12-29 MED ORDER — ALBUMIN HUMAN 5 % IV SOLN
INTRAVENOUS | Status: DC | PRN
  Administered 2016-12-29: 09:00:00 via INTRAVENOUS

## 2016-12-29 MED ORDER — LIDOCAINE 2% (20 MG/ML) 5 ML SYRINGE
INTRAMUSCULAR | Status: DC | PRN
  Administered 2016-12-29: 1 mg/kg/h via INTRAVENOUS

## 2016-12-29 MED ORDER — ONDANSETRON HCL 4 MG/2ML IJ SOLN
4.0000 mg | INTRAMUSCULAR | Status: DC | PRN
  Administered 2016-12-29 – 2016-12-31 (×5): 4 mg via INTRAVENOUS
  Filled 2016-12-29 (×6): qty 2

## 2016-12-29 SURGICAL SUPPLY — 75 items
APPLICATOR COTTON TIP 6IN STRL (MISCELLANEOUS) IMPLANT
APPLIER CLIP ROT 10 11.4 M/L (STAPLE)
APPLIER CLIP ROT 13.4 12 LRG (CLIP)
BLADE SURG SZ11 CARB STEEL (BLADE) ×5 IMPLANT
CABLE HIGH FREQUENCY MONO STRZ (ELECTRODE) ×5 IMPLANT
CHLORAPREP W/TINT 26ML (MISCELLANEOUS) ×10 IMPLANT
CLIP APPLIE ROT 10 11.4 M/L (STAPLE) IMPLANT
CLIP APPLIE ROT 13.4 12 LRG (CLIP) IMPLANT
CLIP SUT LAPRA TY ABSORB (SUTURE) ×10 IMPLANT
CUTTER FLEX LINEAR 45M (STAPLE) ×5 IMPLANT
DECANTER SPIKE VIAL GLASS SM (MISCELLANEOUS) ×10 IMPLANT
DERMABOND ADVANCED (GAUZE/BANDAGES/DRESSINGS) ×2
DERMABOND ADVANCED .7 DNX12 (GAUZE/BANDAGES/DRESSINGS) ×3 IMPLANT
DEVICE SUT QUICK LOAD TK 5 (STAPLE) ×4 IMPLANT
DEVICE SUT TI-KNOT TK 5X26 (MISCELLANEOUS) ×4 IMPLANT
DEVICE SUTURE ENDOST 10MM (ENDOMECHANICALS) ×5 IMPLANT
DEVICE TI KNOT TK5 (MISCELLANEOUS) ×1
DISSECTOR BLUNT TIP ENDO 5MM (MISCELLANEOUS) ×5 IMPLANT
DRAIN PENROSE 18X1/4 LTX STRL (WOUND CARE) ×5 IMPLANT
ELECT REM PT RETURN 15FT ADLT (MISCELLANEOUS) ×5 IMPLANT
GAUZE SPONGE 4X4 12PLY STRL (GAUZE/BANDAGES/DRESSINGS) IMPLANT
GAUZE SPONGE 4X4 16PLY XRAY LF (GAUZE/BANDAGES/DRESSINGS) ×5 IMPLANT
GLOVE BIOGEL PI IND STRL 7.5 (GLOVE) ×3 IMPLANT
GLOVE BIOGEL PI INDICATOR 7.5 (GLOVE) ×2
GLOVE ECLIPSE 7.5 STRL STRAW (GLOVE) ×5 IMPLANT
GOWN STRL REUS W/TWL XL LVL3 (GOWN DISPOSABLE) ×25 IMPLANT
HEMOSTAT SURGICEL 4X8 (HEMOSTASIS) IMPLANT
HOVERMATT SINGLE USE (MISCELLANEOUS) ×5 IMPLANT
KIT BASIN OR (CUSTOM PROCEDURE TRAY) ×5 IMPLANT
KIT GASTRIC LAVAGE 34FR ADT (SET/KITS/TRAYS/PACK) ×5 IMPLANT
LUBRICANT JELLY K Y 4OZ (MISCELLANEOUS) ×5 IMPLANT
MARKER SKIN DUAL TIP RULER LAB (MISCELLANEOUS) ×5 IMPLANT
NEEDLE SPNL 22GX3.5 QUINCKE BK (NEEDLE) ×5 IMPLANT
PACK CARDIOVASCULAR III (CUSTOM PROCEDURE TRAY) ×5 IMPLANT
POUCH SPECIMEN RETRIEVAL 10MM (ENDOMECHANICALS) IMPLANT
QUICK LOAD TK 5 (STAPLE) ×1
RELOAD 45 VASCULAR/THIN (ENDOMECHANICALS) ×5 IMPLANT
RELOAD ENDO STITCH 2.0 (ENDOMECHANICALS) ×20
RELOAD STAPLE TA45 3.5 REG BLU (ENDOMECHANICALS) ×5 IMPLANT
RELOAD STAPLER BLUE 60MM (STAPLE) IMPLANT
RELOAD STAPLER GOLD 60MM (STAPLE) ×3 IMPLANT
RELOAD STAPLER WHITE 60MM (STAPLE) ×3 IMPLANT
SCISSORS LAP 5X45 EPIX DISP (ENDOMECHANICALS) ×5 IMPLANT
SET IRRIG TUBING LAPAROSCOPIC (IRRIGATION / IRRIGATOR) ×5 IMPLANT
SHEARS HARMONIC ACE PLUS 45CM (MISCELLANEOUS) ×5 IMPLANT
SLEEVE ADV FIXATION 12X100MM (TROCAR) ×10 IMPLANT
SOLUTION ANTI FOG 6CC (MISCELLANEOUS) ×5 IMPLANT
STAPLER ECHELON LONG 60 440 (INSTRUMENTS) ×5 IMPLANT
STAPLER RELOAD BLUE 60MM (STAPLE)
STAPLER RELOAD GOLD 60MM (STAPLE) ×5
STAPLER RELOAD WHITE 60MM (STAPLE) ×5
SUT MNCRL AB 4-0 PS2 18 (SUTURE) ×5 IMPLANT
SUT RELOAD ENDO STITCH 2 48X1 (ENDOMECHANICALS) ×15
SUT RELOAD ENDO STITCH 2.0 (ENDOMECHANICALS) ×15
SUT SURGIDAC NAB ES-9 0 48 120 (SUTURE) ×5 IMPLANT
SUT VIC AB 2-0 SH 27 (SUTURE) ×2
SUT VIC AB 2-0 SH 27X BRD (SUTURE) ×3 IMPLANT
SUTURE RELOAD END STTCH 2 48X1 (ENDOMECHANICALS) ×15 IMPLANT
SUTURE RELOAD ENDO STITCH 2.0 (ENDOMECHANICALS) ×15 IMPLANT
SYR 10ML ECCENTRIC (SYRINGE) ×5 IMPLANT
SYR 20CC LL (SYRINGE) ×10 IMPLANT
SYR 50ML LL SCALE MARK (SYRINGE) ×5 IMPLANT
TIP RIGID 35CM EVICEL (HEMOSTASIS) ×5 IMPLANT
TOWEL OR 17X26 10 PK STRL BLUE (TOWEL DISPOSABLE) ×5 IMPLANT
TOWEL OR NON WOVEN STRL DISP B (DISPOSABLE) ×5 IMPLANT
TRAY FOLEY W/METER SILVER 16FR (SET/KITS/TRAYS/PACK) IMPLANT
TROCAR ADV FIXATION 12X100MM (TROCAR) ×5 IMPLANT
TROCAR ADV FIXATION 5X100MM (TROCAR) ×5 IMPLANT
TROCAR BLADELESS OPT 5 100 (ENDOMECHANICALS) ×5 IMPLANT
TROCAR XCEL 12X100 BLDLESS (ENDOMECHANICALS) ×5 IMPLANT
TUBE CALIBRATION LAPBAND (TUBING) ×5 IMPLANT
TUBING CONNECTING 10 (TUBING) ×8 IMPLANT
TUBING CONNECTING 10' (TUBING) ×2
TUBING ENDO SMARTCAP PENTAX (MISCELLANEOUS) ×5 IMPLANT
TUBING INSUF HEATED (TUBING) ×5 IMPLANT

## 2016-12-29 NOTE — Anesthesia Procedure Notes (Signed)
Procedure Name: Intubation Date/Time: 12/29/2016 7:30 AM Performed by: Lavina Hamman, CRNA Pre-anesthesia Checklist: Patient identified, Emergency Drugs available, Suction available, Patient being monitored and Timeout performed Patient Re-evaluated:Patient Re-evaluated prior to induction Oxygen Delivery Method: Circle system utilized Preoxygenation: Pre-oxygenation with 100% oxygen Induction Type: IV induction Ventilation: Mask ventilation without difficulty Laryngoscope Size: Mac and 4 Grade View: Grade I Tube type: Oral Tube size: 7.5 mm Number of attempts: 1 Airway Equipment and Method: Stylet Placement Confirmation: ETT inserted through vocal cords under direct vision,  positive ETCO2,  CO2 detector and breath sounds checked- equal and bilateral Secured at: 22 cm Tube secured with: Tape Dental Injury: Teeth and Oropharynx as per pre-operative assessment

## 2016-12-29 NOTE — Anesthesia Preprocedure Evaluation (Signed)
Anesthesia Evaluation  Patient identified by MRN, date of birth, ID band Patient awake    Reviewed: Allergy & Precautions, NPO status , Patient's Chart, lab work & pertinent test results  Airway Mallampati: I  TM Distance: >3 FB Neck ROM: Full    Dental no notable dental hx.    Pulmonary neg pulmonary ROS,    breath sounds clear to auscultation       Cardiovascular negative cardio ROS   Rhythm:Regular Rate:Normal     Neuro/Psych Anxiety negative neurological ROS     GI/Hepatic hiatal hernia, GERD  ,  Endo/Other  negative endocrine ROSMorbid obesity  Renal/GU negative Renal ROS     Musculoskeletal negative musculoskeletal ROS (+)   Abdominal   Peds  Hematology negative hematology ROS (+)   Anesthesia Other Findings   Reproductive/Obstetrics                             Anesthesia Physical  Anesthesia Plan  ASA: III  Anesthesia Plan: General   Post-op Pain Management:    Induction: Intravenous  PONV Risk Score and Plan: 3 and Ondansetron, Dexamethasone, Midazolam and Treatment may vary due to age or medical condition  Airway Management Planned: Oral ETT  Additional Equipment:   Intra-op Plan:   Post-operative Plan: Extubation in OR  Informed Consent: I have reviewed the patients History and Physical, chart, labs and discussed the procedure including the risks, benefits and alternatives for the proposed anesthesia with the patient or authorized representative who has indicated his/her understanding and acceptance.     Plan Discussed with: CRNA  Anesthesia Plan Comments:         Anesthesia Quick Evaluation

## 2016-12-29 NOTE — Transfer of Care (Signed)
Immediate Anesthesia Transfer of Care Note  Patient: Mary Williams  Procedure(s) Performed: LAPAROSCOPIC ROUX-EN-Y GASTRIC BYPASS WITH HIATAL HERNIA REPAIR (Abdomen) UPPER GI ENDOSCOPY  Patient Location: PACU  Anesthesia Type:General  Level of Consciousness: awake, alert  and oriented  Airway & Oxygen Therapy: Patient Spontanous Breathing and Patient connected to face mask oxygen  Post-op Assessment: Report given to RN  Post vital signs: Reviewed and stable  Last Vitals:  Vitals:   12/29/16 0540  BP: 132/87  Pulse: 72  Resp: 16  Temp: 37 C  SpO2: 99%    Last Pain:  Vitals:   12/29/16 0621  TempSrc:   PainSc: 4       Patients Stated Pain Goal: 4 (81/38/87 1959)  Complications: No apparent anesthesia complications

## 2016-12-29 NOTE — Op Note (Signed)
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   Indications for procedure: 41 y.o. yo female undergoing a laparoscopic roux en y gastric bypass and an upper endoscopy was requested to evaluate the anastomosis.  Description of procedure: After we have completed the new gastrojejunostomy, I scrubbed out and obtained the Olympus endoscope. I gently placed endoscope in the patient's oropharynx and gently glided it down the esophagus without any difficulty under direct visualization. Once I was in the gastric pouch, I insufflated the pouch was air. The pouch was approximately 4 cm in size. I was able to cannulate and advanced the scope through the gastrojejunostomy. Dr.Hoxworth had placed saline in the upper abdomen. Upon further insufflation of the gastric pouch there was no evidence of bubbles. Upon further inspection of the gastric pouch, the mucosa appeared normal. There is no evidence of any mucosal abnormality. The gastric pouch and Roux limb were decompressed. The width of the gastrojejunal anastomosis was at least 2 cm. The scope was withdrawn. The patient tolerated this portion of the procedure well. Please see Dr Excell Seltzer operative note for details regarding the laparoscopic roux-en-y gastric bypass.  Leighton Ruff. Redmond Pulling, MD, FACS General, Bariatric, & Minimally Invasive Surgery [facility], [place]

## 2016-12-29 NOTE — Op Note (Signed)
Preoperative Diagnosis: SP Gastric Sleeve Hiatal Hernia Repair 2017 NV with Reflux  Postoprative Diagnosis: SP Gastric Sleeve Hiatal Hernia Repair 2017 NV with Reflux  Procedure: Procedure(s): LAPAROSCOPIC ROUX-EN-Y GASTRIC BYPASS WITH HIATAL HERNIA REPAIR UPPER GI ENDOSCOPY   Surgeon: Excell Seltzer T   Assistants: Greer Pickerel  Anesthesia:  General endotracheal anesthesia  Indications: Patient is one-year status post laparoscopic sleeve gastrectomy for morbid obesity.  She has developed steadily worsening reflux and postprandial nausea and vomiting.  Workup has shown some degree of esophagitis on endoscopy but no evidence of stricture or mechanical obstruction.  However she has failed to respond and is actually worsening on maximum medical therapy and she feels the symptoms are intolerable.  After extensive discussion regarding indications and risks detailed elsewhere we have elected to proceed with conversion to Roux-en-Y gastric bypass.    Procedure Detail: Patient was brought to the operating room, placed in the supine position on the operating table, and general endotracheal anesthesia induced.  She had received subcutaneous heparin in preoperative broad-spectrum IV antibiotics.  The abdomen was widely sterilely prepped and draped.  Patient timeout was performed and correct procedure verified.  Access was obtained with a 12 mm Optiview trocar in the left upper quadrant without difficulty and pneumoperitoneum established.  There was no evidence of trocar injury.  Under direct vision 12 mm trochars were placed laterally in the right upper quadrant, right upper quadrant midclavicular line and just above and to the left of the umbilicus for the camera port.  A 5 mm trocar was placed laterally in the left upper quadrant.  A bilateral T AP block was performed with dilute Exparel.  Under direct vision through a 5 mm subxiphoid site the [facility] retractor was placed in the left lobe of liver  elevated with excellent exposure of the hiatus of the stomach with minimal adhesions.  Initially I carefully examined the hiatus as the patient had a small hiatal hernia on upper GI series and had had a previous hernia repair.  There did appear to be a small hiatal hernia and this was confirmed by passing the calibration tube into the stomach and the balloon inflated to 10 cc pulled back through the hiatus.  The right crus and hiatus were carefully dissected with harmonic scalpel and blunt dissection.  Fortunately there were minimal adhesions.  The most anterior stitch of the previous posterior crural repair was identified.  The esophagus was freed from some filmy adhesions around the hiatus and there was plenty of intra-abdominal esophagus.  The hiatal defect was well defined.  The hiatus was further closed with an additional 0 Ethibond suture closing the crura up further toward the esophagus.  At this point the sizing tube passed easily through the hiatus but with a 10 cc balloon inflated pullback snugly against the closure.  The balloon was deflated and the tube removed.  Attention was then turned to creating the gastric pouch.  Fortunately there were very few adhesions.  A 5 cm pouch was measured along the lesser curve.  The lesser curve was dissected with the Harmonic scalpel along the wall of the stomach and the lesser sac dissected over toward the staple line of the sleeve and a complete tunnel created behind the sleeve.  The pouch was then created with a single firing of the gold load 60 mm Echelon stapler.  A few omental adhesions along the greater curve were carefully taken down.  Attention was then turned to creating the roux limb.  The ligament  of Treitz was carefully identified and a 40 cm biliopancreatic limb measured and the small bowel was divided at this point with a single firing of the Echelon 60 mm white load stapler.  The end of the Roux limb was marked with a Penrose drain and a 100 cm Roux  limb measured.  At this point a side-to-side anastomosis was created near the end of the biliopancreatic limb to the Roux limb with a single firing of the 45 mm white load stapler.  The common enterotomy was closed with running 2-0 Vicryl begun at either end and tied centrally.  The anastomosis appeared widely patent with good blood supply.  The mesenteric defect was closed with running 2-0 silk.  Suture and staple lines were coated with Evicel tissue sealant.  The omentum was divided with harmonic scalpel along the planned route of the Roux limb.  Following this the Roux limb was brought up easily to the gastric pouch with the candycane facing toward the patient's left.  The gastrojejunostomy was created with an initial posterior row of running 2-0 Vicryl.  The Ewald tube was passed into the pouch and advanced to the tip.  Enterotomies were created in the pouch and the small bowel with harmonic scalpel and then an approximately 2-1/2 cm anastomosis was created with a single firing of the blue load of 45 mm stapler.  The staple line was intact and without bleeding.  The common enterotomy was closed from either end with running 2-0 Vicryl.  The Ewald tube was passed back down through the anastomosis without difficulty and an outer anterior row of running 2-0 Vicryl was placed.  The anastomosis appeared widely patent and under no tension with good blood supply.  The mesenteric defect at the [facility] space was closed with a 2-0 silk between the mesentery of the transverse colon and the edge of the small bowel mesentery.  Following this with the outlet of the pouch clamp Dr. Redmond Pulling performed upper endoscopy and with the patch tensely distended there was no evidence of leak.  Suture and staple lines of the gastrojejunostomy were coated with Evicel.  The abdomen was carefully inspected for bleeding or injury and everything looked fine.  The Nathanson retractor was removed under direct vision.  All CO2 was evacuated and  trochars removed.  Skin incisions were closed with subcuticular Monocryl and Dermabond.  Sponge needle and instrument counts were correct.    Findings: As above  Estimated Blood Loss:  Minimal         Drains: None  Blood Given: none          Specimens: None        Complications:  * No complications entered in OR log *         Disposition: PACU - hemodynamically stable.         Condition: stable

## 2016-12-29 NOTE — Plan of Care (Signed)
  Education: Ability to state signs and symptoms to report to health care provider will improve 12/29/2016 2050 - Progressing by Mortimer Fries, RN   Activity: Ability to tolerate increased activity will improve 12/29/2016 2050 - Progressing by Mortimer Fries, RN   Bowel/Gastric: Gastrointestinal status for postoperative course will improve 12/29/2016 2050 - Progressing by Mortimer Fries, RN   Clinical Measurements: Will remain free from infection 12/29/2016 2050 - Progressing by Mortimer Fries, RN   Pain Management: Pain level will decrease 12/29/2016 2050 - Progressing by Mortimer Fries, RN

## 2016-12-29 NOTE — H&P (Signed)
History of Present Illness Marland Kitchen T. Jernard Reiber MD; 12/25/2016 2:57 PM) The patient is a 41 year old female presenting status-post bariatric surgery. She returns now 1 year following laparoscopic sleeve gastrectomy for severe morbid obesity with comorbidities of chronic back and joint pain and mild GERD. She has had some controllable GERD postoperatively which unfortunately has gotten a lot worse over the last couple of months. She now reports that she is having episodes of small volume emesis 2 or 3 times per day. Usually with solid foods and able to keep liquids down. She does have frequent significant heartburn and water brash as well. Currently on Protonix 40 mg daily. She does not eat after supper at 6 or 7. At the time of her surgery we did repair a moderate sized hiatal hernia. These symptoms have gotten progressively worse despite maximal medical management and are now completely intolerable where she is really not eating much and in constant discomfort from reflux. We recently obtained an upper GI series showing esophageal dysmotility and moderate esophageal stasis with a small hiatal hernia and inducible reflux. A distal esophageal stricture was suspected. Dr. Lucia Gaskins performed upper endoscopy showing some distal esophageal ulcers but normal anatomy without stricture or narrowing. We added Carafate slurry to her regimen but her symptoms have just progressively worsened to where she feels they're completely intolerable. For this reason I have discussed with the patient converting to Roux-en-Y gastric bypass. This may be due to esophageal dysmotility. I suspect this would have a high likelihood of relieving her symptoms. She is here for preoperative visit.   Problem List/Past Medical Marland Kitchen T. Kmya Placide, MD; 12/25/2016 2:57 PM) ACUTE APPENDICITIS (K35.80)  RUQ PAIN (R10.11)  WOUND INFECTION AFTER SURGERY, INITIAL ENCOUNTER (T81.4XXA)  MORBID OBESITY WITH BMI OF 50.0-59.9, ADULT  (E66.01)  S/P LAPAROSCOPIC SLEEVE GASTRECTOMY (Z98.84)  VOMITING (R11.10)  ACID REFLUX (K21.9)   Past Surgical History Marland Kitchen T. Eesa Justiss, MD; 12/25/2016 2:57 PM) Hysterectomy (due to cancer) - Partial  Oral Surgery   Diagnostic Studies History Marland Kitchen T. Wayland Baik, MD; 12/25/2016 2:57 PM) Colonoscopy  never Mammogram  >3 years ago Pap Smear  1-5 years ago  Allergies (Tanisha A. Owens Shark, [place]; 12/25/2016 2:37 PM) Penicillin G Pot in Dextrose *PENICILLINS*  Ceclor *CEPHALOSPORINS*  Allergies Reconciled   Medication History Marland Kitchen T. Shreyas Piatkowski, MD; 12/25/2016 2:57 PM) Protonix (40MG  Tablet DR, 1 (one) Oral daily, Taken starting 11/20/2016) Active. Lexapro (20MG  Tablet, Oral daily) Active. Medications Reconciled OxyCODONE HCl (5MG /5ML Solution, 5-10 Milliliter Oral every four hours, as needed, Taken starting 12/25/2016) Active.  Social History Marland Kitchen T. Paulita Licklider, MD; 12/25/2016 2:57 PM) Tobacco use  Never smoker. Alcohol use  Occasional alcohol use. Caffeine use  Carbonated beverages, Tea. No drug use   Family History Marland Kitchen T. Colman Birdwell, MD; 12/25/2016 2:57 PM) Arthritis  Father, Mother. Breast Cancer  Family Members In General. Kidney Disease  Daughter. Diabetes Mellitus  Family Members In General, Father. Heart disease in female family member before age 36  Hypertension  Family Members In General, Father, Mother.  Pregnancy / Birth History Marland Kitchen T. Nataniel Gasper, MD; 12/25/2016 2:57 PM) Age at menarche  62 years. Gravida  4 Irregular periods  Maternal age  67-25 Para  69  Other Problems Marland Kitchen T. Khanh Tanori, MD; 12/25/2016 2:57 PM) Gastroesophageal Reflux Disease  Anxiety Disorder  Back Pain   Vitals (Tanisha A. Brown RMA; 12/25/2016 2:36 PM) 12/25/2016 2:36 PM Weight: 236.4 lb Height: 64in Body Surface Area: 2.1 m Body Mass Index: 40.58 kg/m  Temp.: 97.50F  Pulse: 106 (  Regular)  BP: 124/82 (Sitting, Left Arm,  Standard)       Physical Exam Marland Kitchen T. Arcola Freshour MD; 12/25/2016 2:58 PM) The physical exam findings are as follows: Note:General: Alert, overweight Caucasian female, in mild distress Skin: Warm and dry without rash or infection. HEENT: No palpable masses or thyromegaly. Sclera nonicteric. Pupils equal round and reactive. Lungs: Breath sounds clear and equal. No wheezing or increased work of breathing. Cardiovascular: Regular rate and rhythm without murmer. No JVD or edema. Peripheral pulses intact. No carotid bruits. Abdomen: Nondistended. Soft and nontender. No masses palpable. No organomegaly. No palpable hernias. Extremities: No edema or joint swelling or deformity. No chronic venous stasis changes. Neurologic: Alert and fully oriented. Gait normal. No focal weakness. Psychiatric: Normal mood and affect. Thought content appropriate with normal judgement and insight    Assessment & Plan Marland Kitchen T. Renelle Stegenga MD; 12/25/2016 3:00 PM) S/P LAPAROSCOPIC SLEEVE GASTRECTOMY (Z98.84) Impression: Status post laparoscopic sleeve gastrectomy and repair of hiatal hernia, now 1 year postop with very good weight loss of 71 pounds for BMI of 51.7 to current 39.7. Improvement in joint pain but unfortunately recent significant worsening of reflux and food intolerance. For now we will do her Protonix to 40 twice a day. Obtain an upper GI series as initial evaluation to look for stricture or recurrent hiatal hernia. I will call her after the study with plans for the next steps. We discussed that ultimately she could even require revision to gastric bypass depending on findings. We will check routine lab and vitamin levels today. VOMITING (R11.10) ACID REFLUX (K21.9) Impression: Severe acid reflux with esophageal ulceration, nausea and vomiting gradually worsening post sleeve gastrectomy. Possibly secondary to esophageal dysmotility. Symptoms are not at all controlled with maximal medical management and  the only solution were appeared to be conversion to gastric bypass. I discussed this procedure in detail with the patient including the exact anatomy and nature of the surgery. We discussed expected outcome and recovery. Discussed risks of anesthetic complications, bleeding, infection, leakage. We discussed long-term potential complications such as bowel obstruction and nutritional deficiencies. All her questions were answered and she agrees to proceed. She is given prescription for pain medication preoperatively.

## 2016-12-29 NOTE — Discharge Instructions (Signed)
° ° ° °GASTRIC BYPASS/SLEEVE ° Home Care Instructions ° ° These instructions are to help you care for yourself when you go home. ° °Call: If you have any problems. °• Call [phone] and ask for the surgeon on call °• If you need immediate assistance come to the ER at [facility]. Tell the ER staff you are a new post-op gastric bypass or gastric sleeve patient  °Signs and symptoms to report: • Severe  vomiting or nausea °o If you cannot handle clear liquids for longer than 1 day, call your surgeon °• Abdominal pain which does not get better after taking your pain medication °• Fever greater than 100.4°  F and chills °• Heart rate over 100 beats a minute °• Trouble breathing °• Chest pain °• Redness,  swelling, drainage, or foul odor at incision (surgical) sites °• If your incisions open or pull apart °• Swelling or pain in calf (lower leg) °• Diarrhea (Loose bowel movements that happen often), frequent watery, uncontrolled bowel movements °• Constipation, (no bowel movements for 3 days) if this happens: °o Take Milk of Magnesia, 2 tablespoons by mouth, 3 times a day for 2 days if needed °o Stop taking Milk of Magnesia once you have had a bowel movement °o Call your doctor if constipation continues °Or °o Take Miralax  (instead of Milk of Magnesia) following the label instructions °o Stop taking Miralax once you have had a bowel movement °o Call your doctor if constipation continues °• Anything you think is “abnormal for you” °  °Normal side effects after surgery: • Unable to sleep at night or unable to concentrate °• Irritability °• Being tearful (crying) or depressed ° °These are common complaints, possibly related to your anesthesia, stress of surgery, and change in lifestyle, that usually go away a few weeks after surgery. If these feelings continue, call your medical doctor.  °Wound Care: You may have surgical glue, steri-strips, or staples over your incisions after surgery °• Surgical glue: Looks like clear  film over your incisions and will wear off a little at a time °• Steri-strips: Adhesive strips of tape over your incisions. You may notice a yellowish color on skin under the steri-strips. This is used to make the steri-strips stick better. Do not pull the steri-strips off - let them fall off °• Staples: Staples may be removed before you leave the hospital °o If you go home with staples, call [facility] for an appointment with your surgeon’s nurse to have staples removed 10 days after surgery, [phone] °• Showering: You may shower two (2) days after your surgery unless your surgeon tells you differently °o Wash gently around incisions with warm soapy water, rinse well, and gently pat dry °o If you have a drain (tube from your incision), you may need someone to hold this while you shower °o No tub baths until staples are removed and incisions are healed °  °Medications: • Medications should be liquid or crushed if larger than the size of a dime °• Extended release pills (medication that releases a little bit at a time through the  day) should not be crushed °• Depending on the size and number of medications you take, you may need to space (take a few throughout the day)/change the time you take your medications so that you do not over-fill your pouch (smaller stomach) °• Make sure you follow-up with you primary care physician to make medication changes needed during rapid weight loss and life -style changes °•   If you have diabetes, follow up with your doctor that orders your diabetes medication(s) within one week after surgery and check your blood sugar regularly ° °• Do not drive while taking narcotics (pain medications) ° °• Do not take acetaminophen (Tylenol) and Roxicet or Lortab Elixir at the same time since these pain medications contain acetaminophen °  °Diet:  °First 2 Weeks You will see the nutritionist about two (2) weeks after your surgery. The nutritionist will increase the types of  foods you can eat if you are handling liquids well: °• If you have severe vomiting or nausea and cannot handle clear liquids lasting longer than 1 day call your surgeon °Protein Shake °• Drink at least 2 ounces of shake 5-6 times per day °• Each serving of protein shakes (usually 8-12 ounces) should have a minimum of: °o 15 grams of protein °o And no more than 5 grams of carbohydrate °• Goal for protein each day: °o Men = 80 grams per day °o Women = 60 grams per day °  ° • Protein powder may be added to fluids such as non-fat milk or Lactaid milk or Soy milk (limit to 35 grams added protein powder per serving) ° °Hydration °• Slowly increase the amount of water and other clear liquids as tolerated (See Acceptable Fluids) °• Slowly increase the amount of protein shake as tolerated °• Sip fluids slowly and throughout the day °• May use sugar substitutes in small amounts (no more than 6-8 packets per day; i.e. Splenda) ° °Fluid Goal °• The first goal is to drink at least 8 ounces of protein shake/drink per day (or as directed by the nutritionist); some examples of protein shakes are Syntrax Nectar, Adkins Advantage, EAS Edge HP, and Unjury. - See handout from pre-op Bariatric Education Class: °o Slowly increase the amount of protein shake you drink as tolerated °o You may find it easier to slowly sip shakes throughout the day °o It is important to get your proteins in first °• Your fluid goal is to drink 64-100 ounces of fluid daily °o It may take a few weeks to build up to this  °• 32 oz. (or more) should be clear liquids °And °• 32 oz. (or more) should be full liquids (see below for examples) °• Liquids should not contain sugar, caffeine, or carbonation ° °Clear Liquids: °• Water of Sugar-free flavored water (i.e. Fruit H²O, Propel) °• Decaffeinated coffee or tea (sugar-free) °• Crystal lite, Wyler’s Lite, Minute Maid Lite °• Sugar-free Jell-O °• Bouillon or broth °• Sugar-free Popsicle:    - Less than 20 calories  each; Limit 1 per day ° °Full Liquids: °                  Protein Shakes/Drinks + 2 choices per day of other full liquids °• Full liquids must be: °o No More Than 12 grams of Carbs per serving °o No More Than 3 grams of Fat per serving °• Strained low-fat cream soup °• Non-Fat milk °• Fat-free Lactaid Milk °• Sugar-free yogurt (Dannon Lite & Fit, Greek yogurt) ° °  °Vitamins and Minerals • Start 1 day after surgery unless otherwise directed by your surgeon °• 2 Chewable Bariatric Multivitamin / Multimineral Supplement with iron °• Chewable Calcium Citrate with Vitamin D-3 °(Example: 3 Chewable Calcium  Plus 600 with Vitamin D-3) °o Take 500 mg three (3) times a day for a total of 1500 mg each day °o Do not take all 3 doses of calcium   at one time as it may cause constipation, and you can only absorb 500 mg at a time °o Do not mix multivitamins containing iron with calcium supplements;  take 2 hours apart °• Menstruating women and those at risk for anemia ( a blood disease that causes weakness) may need extra iron °o Talk to your doctor to see if you need more iron °• If you need extra iron: Total daily Iron recommendation (including Vitamins) is 50 to 100 mg Iron/day °• Do not stop taking or change any vitamins or minerals until you talk to your nutritionist or surgeon °• Your nutritionist and/or surgeon must approve all vitamin and mineral supplements °  °Activity and Exercise: It is important to continue walking at home. Limit your physical activity as instructed by your doctor. During this time, use these guidelines: °• Do not lift anything greater than ten  (10) pounds for at least two (2) weeks °• Do not go back to work or drive until your surgeon says you can °• You may have sex when you feel comfortable °o It is VERY important for female patients to use a reliable birth control method; fertility often increase after surgery °o Do not get pregnant for at least 18 months °• Start exercising as soon as your  doctor tells you that you can °o Make sure your doctor approves any physical activity °• Start with a simple walking program °• Walk 5-15 minutes each day, 7 days per week °• Slowly increase until you are walking 30-45 minutes per day °• Consider joining our BELT program. [phone] or email belt@[employer].edu °  °Special Instructions Things to remember: °• Use your CPAP when sleeping if this applies to you °• Consider buying a medical alert bracelet that says you had lap-band surgery °  °  You will likely have your first fill (fluid added to your band) 6 - 8 weeks after surgery °• [facility] has a free Bariatric Surgery Support Group that meets monthly, the 3rd Thursday, 6pm. [employer] [facility] Classrooms. You can see classes online at [email or web address] °• It is very important to keep all follow up appointments with your surgeon, nutritionist, primary care physician, and behavioral health practitioner °o After the first year, please follow up with your bariatric surgeon and nutritionist at least once a year in order to maintain best weight loss results °      °             [facility]:  [phone] ° °             [facility]: [phone] ° °             Bariatric Nurse Coordinator: [phone]  °Gastric Bypass/Sleeve Home Care Instructions  Rev. 03/2012    ° °                                                    Reviewed and Endorsed °                                                   by [facility] Patient Education Committee, Jan, 2014 ° ° ° ° ° ° ° ° ° °

## 2016-12-29 NOTE — Anesthesia Postprocedure Evaluation (Signed)
Anesthesia Post Note  Patient: Mary Williams  Procedure(s) Performed: LAPAROSCOPIC ROUX-EN-Y GASTRIC BYPASS WITH HIATAL HERNIA REPAIR (Abdomen) UPPER GI ENDOSCOPY     Patient location during evaluation: PACU Anesthesia Type: General Level of consciousness: awake and alert Pain management: pain level controlled Vital Signs Assessment: post-procedure vital signs reviewed and stable Respiratory status: spontaneous breathing, nonlabored ventilation and respiratory function stable Cardiovascular status: blood pressure returned to baseline and stable Postop Assessment: no apparent nausea or vomiting Anesthetic complications: no    Last Vitals:  Vitals:   12/29/16 1145 12/29/16 1200  BP: 127/74 125/81  Pulse: 81 77  Resp: 14 13  Temp:  36.4 C  SpO2: 96% 97%    Last Pain:  Vitals:   12/29/16 1230  TempSrc:   PainSc: Asleep                 Lynda Rainwater

## 2016-12-29 NOTE — Interval H&P Note (Signed)
History and Physical Interval Note:  12/29/2016 7:18 AM  Mary Williams  has presented today for surgery, with the diagnosis of S/P Gastric Sleeve, Hiatal Hernia Repair 2017, N/V with Reflux  The various methods of treatment have been discussed with the patient and family. After consideration of risks, benefits and other options for treatment, the patient has consented to  Procedure(s): LAPAROSCOPIC ROUX-EN-Y GASTRIC BYPASS WITH UPPER ENDOSCOPY (N/A) as a surgical intervention .  The patient's history has been reviewed, patient examined, no change in status, stable for surgery.  I have reviewed the patient's chart and labs.  Questions were answered to the patient's satisfaction.     Altariq Goodall T

## 2016-12-30 ENCOUNTER — Encounter (HOSPITAL_COMMUNITY): Payer: Self-pay | Admitting: General Surgery

## 2016-12-30 LAB — CBC WITH DIFFERENTIAL/PLATELET
Basophils Absolute: 0 10*3/uL (ref 0.0–0.1)
Basophils Relative: 0 %
EOS PCT: 0 %
Eosinophils Absolute: 0 10*3/uL (ref 0.0–0.7)
HCT: 37.7 % (ref 36.0–46.0)
Hemoglobin: 12.4 g/dL (ref 12.0–15.0)
LYMPHS ABS: 2.2 10*3/uL (ref 0.7–4.0)
LYMPHS PCT: 16 %
MCH: 28.8 pg (ref 26.0–34.0)
MCHC: 32.9 g/dL (ref 30.0–36.0)
MCV: 87.5 fL (ref 78.0–100.0)
MONOS PCT: 8 %
Monocytes Absolute: 1.2 10*3/uL — ABNORMAL HIGH (ref 0.1–1.0)
Neutro Abs: 11.1 10*3/uL — ABNORMAL HIGH (ref 1.7–7.7)
Neutrophils Relative %: 76 %
PLATELETS: 318 10*3/uL (ref 150–400)
RBC: 4.31 MIL/uL (ref 3.87–5.11)
RDW: 13.2 % (ref 11.5–15.5)
WBC: 14.5 10*3/uL — AB (ref 4.0–10.5)

## 2016-12-30 MED ORDER — PROMETHAZINE HCL 25 MG/ML IJ SOLN: 12.5000 mg | Freq: Four times a day (QID) | INTRAMUSCULAR | Status: DC | PRN

## 2016-12-30 MED ORDER — ESCITALOPRAM OXALATE 20 MG PO TABS
20.0000 mg | ORAL_TABLET | Freq: Every day | ORAL | Status: DC
  Administered 2016-12-30 – 2016-12-31 (×2): 20 mg via ORAL
  Filled 2016-12-30 (×2): qty 1

## 2016-12-30 NOTE — Plan of Care (Signed)

## 2016-12-30 NOTE — Progress Notes (Signed)
Patient ID: Mary Williams, female   DOB: 03-23-1975, 41 y.o.   MRN: [id number] 1 Day Post-Op   Subjective: Complaining of pain, mostly right upper abdomen and related to movement.  Some nausea without vomiting.  Tolerating sips of water.  Has been up and walking.  Objective: Vital signs in last 24 hours: Temp:  [97.6 F (36.4 C)-98.7 F (37.1 C)] 98.3 F (36.8 C) (11/07 0600) Pulse Rate:  [58-96] 58 (11/07 0600) Resp:  [13-18] 18 (11/07 0600) BP: (116-144)/(65-81) 122/65 (11/07 0600) SpO2:  [93 %-100 %] 98 % (11/07 0600) Weight:  [116.9 kg (257 lb 12 oz)] 116.9 kg (257 lb 12 oz) (11/07 0500) Last BM Date: 12/28/16  Intake/Output from previous day: 11/06 0701 - 11/07 0700 In: 4370 [P.O.:120; I.V.:4000; IV Piggyback:250] Out: 2671 [Urine:1510; Blood:25] Intake/Output this shift: No intake/output data recorded.  General appearance: alert, cooperative and mild distress Resp: No wheezing or increased work of breathing GI: Mild diffuse tenderness.  No distention. Incision/Wound: No erythema or drainage  Lab Results:  Recent Labs    12/28/16 1217 12/29/16 1504 12/30/16 0518  WBC 9.9  --  14.5*  HGB 13.6 14.0 12.4  HCT 40.9 41.5 37.7  PLT 355  --  318   BMET Recent Labs    12/28/16 1217  NA 142  K 4.4  CL 106  CO2 28  GLUCOSE 92  BUN 12  CREATININE 0.70  CALCIUM 8.9     Studies/Results: Dg Chest 2 View  Result Date: 12/28/2016 CLINICAL DATA:  Preop gastric sleeve revision EXAM: CHEST  2 VIEW COMPARISON:  09/12/2015 FINDINGS: The heart size and mediastinal contours are within normal limits. Both lungs are clear. The visualized skeletal structures are unremarkable. IMPRESSION: No active cardiopulmonary disease. Electronically Signed   By: Donavan Foil M.D.   On: 12/28/2016 23:48    Anti-infectives: Anti-infectives (From admission, onward)   Start     Dose/Rate Route Frequency Ordered Stop   12/29/16 0651  levofloxacin (LEVAQUIN) 750 MG/150ML IVPB    Comments:   Key, Kristopher   : cabinet override      12/29/16 0651 12/29/16 0738   12/29/16 0611  levofloxacin (LEVAQUIN) IVPB 750 mg     750 mg 100 mL/hr over 90 Minutes Intravenous On call to O.R. 12/29/16 0611 12/29/16 0838      Assessment/Plan: s/p Procedure(s): LAPAROSCOPIC ROUX-EN-Y GASTRIC BYPASS WITH HIATAL HERNIA REPAIR UPPER GI ENDOSCOPY Stable postoperatively.  I expect this is incisional pain and some expected nausea.  Plan advance to protein shakes later today.  Continue to treat nausea aggressively.  Ambulation encouraged.   LOS: 1 day    Telford Archambeau T 12/30/2016

## 2016-12-30 NOTE — Progress Notes (Signed)
Patient alert and oriented, Post op day 1.  Provided support and encouragement.  Encouraged pulmonary toilet, ambulation and small sips of liquids.  All questions answered.  Will continue to monitor. 

## 2016-12-31 LAB — CBC WITH DIFFERENTIAL/PLATELET
BASOS PCT: 0 %
Basophils Absolute: 0 10*3/uL (ref 0.0–0.1)
Eosinophils Absolute: 0.2 10*3/uL (ref 0.0–0.7)
Eosinophils Relative: 2 %
HEMATOCRIT: 35.3 % — AB (ref 36.0–46.0)
HEMOGLOBIN: 11.2 g/dL — AB (ref 12.0–15.0)
LYMPHS ABS: 3.9 10*3/uL (ref 0.7–4.0)
Lymphocytes Relative: 38 %
MCH: 28.4 pg (ref 26.0–34.0)
MCHC: 31.7 g/dL (ref 30.0–36.0)
MCV: 89.6 fL (ref 78.0–100.0)
MONO ABS: 0.8 10*3/uL (ref 0.1–1.0)
MONOS PCT: 8 %
NEUTROS ABS: 5.4 10*3/uL (ref 1.7–7.7)
Neutrophils Relative %: 52 %
Platelets: 261 10*3/uL (ref 150–400)
RBC: 3.94 MIL/uL (ref 3.87–5.11)
RDW: 13.6 % (ref 11.5–15.5)
WBC: 10.2 10*3/uL (ref 4.0–10.5)

## 2016-12-31 MED ORDER — DIPHENHYDRAMINE HCL 12.5 MG/5ML PO ELIX
25.0000 mg | ORAL_SOLUTION | Freq: Three times a day (TID) | ORAL | Status: DC | PRN
  Administered 2016-12-31 – 2017-01-01 (×4): 25 mg via ORAL
  Filled 2016-12-31 (×5): qty 10

## 2016-12-31 NOTE — Progress Notes (Signed)
Patient alert and oriented, pain is controlled. Patient is tolerating fluids, advanced to protein shake today, patient is tolerating well. Reviewed Gastric Bypass discharge instructions with patient and patient is able to articulate understanding. Provided information on BELT program, Support Group and [facility] outpatient pharmacy. All questions answered, will continue to monitor.    

## 2016-12-31 NOTE — Progress Notes (Signed)
Patient ID: Mary Williams, female   DOB: April 03, 1975, 41 y.o.   MRN: [id number] 2 Days Post-Op   Subjective: Still with nausea with p.o. intake.  No vomiting.  Has sipped on a little bit of water and protein shake.  Still with pain, upper abdomen but better than yesterday.  She has been ambulatory.  Has developed an itching rash on her abdomen  Objective: Vital signs in last 24 hours: Temp:  [97.7 F (36.5 C)-98.4 F (36.9 C)] 98.4 F (36.9 C) (11/08 0520) Pulse Rate:  [52-62] 54 (11/08 0520) Resp:  [16-18] 18 (11/08 0520) BP: (108-124)/(62-73) 110/62 (11/08 0520) SpO2:  [100 %] 100 % (11/08 0520) Weight:  [116.8 kg (257 lb 8 oz)] 116.8 kg (257 lb 8 oz) (11/08 0520) Last BM Date: 12/28/16  Intake/Output from previous day: 11/07 0701 - 11/08 0700 In: 2726 [P.O.:126; I.V.:2600] Out: 1400 [Urine:1400] Intake/Output this shift: No intake/output data recorded.  General appearance: alert, cooperative and no distress Resp: No wheezing or increased work of breathing GI: Nondistended, minimal tenderness improved from yesterday Skin: Skin color, texture, turgor normal. No rashes or lesions or Patchy raised erythematous rash on both flanks Incision/Wound: No drainage or erythema  Lab Results:  Recent Labs    12/30/16 0518 12/31/16 0448  WBC 14.5* 10.2  HGB 12.4 11.2*  HCT 37.7 35.3*  PLT 318 261   BMET Recent Labs    12/28/16 1217  NA 142  K 4.4  CL 106  CO2 28  GLUCOSE 92  BUN 12  CREATININE 0.70  CALCIUM 8.9     Studies/Results: No results found.  Anti-infectives: Anti-infectives (From admission, onward)   Start     Dose/Rate Route Frequency Ordered Stop   12/29/16 0651  levofloxacin (LEVAQUIN) 750 MG/150ML IVPB    Comments:  Key, Kristopher   : cabinet override      12/29/16 0651 12/29/16 0738   12/29/16 0611  levofloxacin (LEVAQUIN) IVPB 750 mg     750 mg 100 mL/hr over 90 Minutes Intravenous On call to O.R. 12/29/16 6213 12/29/16 0838       Assessment/Plan: s/p Procedure(s): LAPAROSCOPIC ROUX-EN-Y GASTRIC BYPASS WITH HIATAL HERNIA REPAIR UPPER GI ENDOSCOPY There is some improvement from yesterday with less pain and starting some p.o. intake but still a lot of nausea without vomiting.  I do not think she is ready for discharge with the amount of oral intake she is tolerating.  She is encouraged to continue to ambulate and push p.o. fluids and overall I think she is improved and I do not see any evidence of complication.  She has tended to have a lot of nausea following surgical procedures. Rash on abdomen likely from prep.  P.o. Benadryl ordered.    LOS: 2 days    Sudie Bandel T 12/31/2016

## 2017-01-01 LAB — CBC
HEMATOCRIT: 35.3 % — AB (ref 36.0–46.0)
Hemoglobin: 11.3 g/dL — ABNORMAL LOW (ref 12.0–15.0)
MCH: 28.5 pg (ref 26.0–34.0)
MCHC: 32 g/dL (ref 30.0–36.0)
MCV: 89.1 fL (ref 78.0–100.0)
Platelets: 264 10*3/uL (ref 150–400)
RBC: 3.96 MIL/uL (ref 3.87–5.11)
RDW: 13.5 % (ref 11.5–15.5)
WBC: 10.4 10*3/uL (ref 4.0–10.5)

## 2017-01-01 LAB — BASIC METABOLIC PANEL
ANION GAP: 5 (ref 5–15)
BUN: 7 mg/dL (ref 6–20)
CALCIUM: 8.3 mg/dL — AB (ref 8.9–10.3)
CO2: 28 mmol/L (ref 22–32)
Chloride: 108 mmol/L (ref 101–111)
Creatinine, Ser: 0.85 mg/dL (ref 0.44–1.00)
GFR calc Af Amer: >60 mL/min (ref >60)
GFR calc non Af Amer: >60 mL/min (ref >60)
GLUCOSE: 90 mg/dL (ref 65–99)
Potassium: 4.4 mmol/L (ref 3.5–5.1)
Sodium: 141 mmol/L (ref 135–145)

## 2017-01-01 NOTE — Plan of Care (Signed)
  Bowel/Gastric: Gastrointestinal status for postoperative course will improve 01/01/2017 1343 - Progressing by Dorene Sorrow, RN   Nutritional: Nutritional status will improve 01/01/2017 1343 - Progressing by Dorene Sorrow, RN

## 2017-01-01 NOTE — Progress Notes (Signed)
Patient ID: Mary Williams, female   DOB: 1976/02/11, 41 y.o.   MRN: [id number] 3 Days Post-Op   Subjective: Feels "blah" and abdominal rash still itching.  Benadryl helps for a little while.  Very "sore" and upper abdomen but no severe pain.  Has been ambulatory.  Still some nausea but better, tolerating water and protein shakes fairly well.  No vomiting.  Objective: Vital signs in last 24 hours: Temp:  [98.2 F (36.8 C)-99.4 F (37.4 C)] 98.8 F (37.1 C) (11/09 0512) Pulse Rate:  [63-73] 72 (11/09 0512) Resp:  [18] 18 (11/09 0512) BP: (112-120)/(50-64) 119/54 (11/09 0512) SpO2:  [99 %-100 %] 99 % (11/09 0512) Weight:  [116.7 kg (257 lb 4.4 oz)] 116.7 kg (257 lb 4.4 oz) (11/09 0512) Last BM Date: 12/28/16(Per pt report)  Intake/Output from previous day: 11/08 0701 - 11/09 0700 In: 2750 [P.O.:450; I.V.:2300] Out: 1100 [Urine:1100] Intake/Output this shift: No intake/output data recorded.  General appearance: alert, cooperative and no distress GI: Mild appropriate upper abdominal tenderness Skin: Skin color, texture, turgor normal. No rashes or lesions or Patchy erythematous rash stable from yesterday over abdomen prep area Incision/Wound: Some also under Dermabond but no evidence of infection or drainage  Lab Results:  Recent Labs    12/31/16 0448 01/01/17 0441  WBC 10.2 10.4  HGB 11.2* 11.3*  HCT 35.3* 35.3*  PLT 261 264   BMET Recent Labs    01/01/17 0441  NA 141  K 4.4  CL 108  CO2 28  GLUCOSE 90  BUN 7  CREATININE 0.85  CALCIUM 8.3*     Studies/Results: No results found.  Anti-infectives: Anti-infectives (From admission, onward)   Start     Dose/Rate Route Frequency Ordered Stop   12/29/16 0651  levofloxacin (LEVAQUIN) 750 MG/150ML IVPB    Comments:  Key, Kristopher   : cabinet override      12/29/16 0651 12/29/16 0738   12/29/16 0611  levofloxacin (LEVAQUIN) IVPB 750 mg     750 mg 100 mL/hr over 90 Minutes Intravenous On call to O.R. 12/29/16  0611 12/29/16 0838      Assessment/Plan: s/p Procedure(s): LAPAROSCOPIC ROUX-EN-Y GASTRIC BYPASS WITH HIATAL HERNIA REPAIR UPPER GI ENDOSCOPY Gradual improvement.  No evidence of complication other than some allergic reaction likely to the abdominal prep.  Tolerating water and protein shakes better today.  I think she should be ready for discharge later today.   LOS: 3 days    Masen Salvas T 01/01/2017

## 2017-01-01 NOTE — Progress Notes (Signed)
01/01/17  1450  Reviewed discharge instructions with patient. Patient verbalized understanding of discharge instructions. Copy of discharge instructions and prescriptions given to patient.

## 2017-01-01 NOTE — Discharge Summary (Signed)
  Patient ID: Mary Williams [id number] 41 y.o. [birth year]  12/29/2016  Discharge date and time: 01/01/2017   Admitting Physician: Excell Seltzer T  Discharge Physician: Excell Seltzer T  Admission Diagnoses: SP Gastric Sleeve Hiatal Hernia Repair 2017 NV with Reflux  Discharge Diagnoses: Same  Operations: Procedure(s): LAPAROSCOPIC ROUX-EN-Y GASTRIC BYPASS WITH HIATAL HERNIA REPAIR UPPER GI ENDOSCOPY  Admission Condition: fair  Discharged Condition: fair  Indication for Admission: Patient is one-year status post sleeve gastrectomy with initially smooth course but over the last 6 months progressive postprandial nausea vomiting and reflux that has become intolerable.  Workup including upper GI series and endoscopy relatively unremarkable.  She did have evidence of esophagitis.  She continues to worsen despite maximal medical management to the point she feels her symptoms are intolerable and after extensive discussion regarding benefits and risks detailed elsewhere we have elected to convert her sleeve gastrectomy to Roux-en-Y gastric bypass in an effort to relieve her symptoms.  Hospital Course: On the morning of admission the patient underwent an uneventful laparoscopic conversion of sleeve gastrectomy to Roux-en-Y gastric bypass.  Her postoperative course was relatively uncomplicated.  She did develop a patchy rash over her abdomen corresponding to the prep area consistent with atopic dermatitis.  This was treated with Benadryl.  She did have quite a bit of nausea postoperatively which gradually improved daily.  Abdomen remained benign and laboratory was unremarkable.  On the day of discharge she is tolerating protein shakes and water with slight nausea but no vomiting.  She is ambulatory.  She is felt ready for discharge.   Disposition: Home  Patient Instructions:  Allergies as of 01/01/2017      Reactions   Fish Allergy Anaphylaxis   Penicillins Anaphylaxis   Has patient  had a PCN reaction causing immediate rash, facial/tongue/throat swelling, SOB or lightheadedness with hypotension: Yes Has patient had a PCN reaction causing severe rash involving mucus membranes or skin necrosis: No Has patient had a PCN reaction that required hospitalization No Has patient had a PCN reaction occurring within the last 10 years: No If all of the above answers are "NO", then may proceed with Cephalosporin use.   Shellfish Allergy Anaphylaxis   Ceclor [cefaclor] Hives      Medication List    STOP taking these medications   sucralfate 1 GM/10ML suspension Commonly known as:  CARAFATE     TAKE these medications   acetaminophen 500 MG tablet Commonly known as:  TYLENOL Take 1,000 mg by mouth every 8 (eight) hours as needed for mild pain or headache.   calcium carbonate 500 MG chewable tablet Commonly known as:  TUMS - dosed in mg elemental calcium Chew 3 tablets by mouth 6 (six) times daily.   escitalopram 20 MG tablet Commonly known as:  LEXAPRO Take 20 mg by mouth at bedtime.   pantoprazole 40 MG tablet Commonly known as:  PROTONIX Take 40 mg by mouth 2 (two) times daily.       Activity: activity as tolerated Diet: Bariatric protein shakes Wound Care: none needed  Follow-up:  With her [facility] in 3 weeks.  Signed: Edward Jolly MD, FACS  01/01/2017, 8:17 AM

## 2017-01-06 ENCOUNTER — Telehealth (HOSPITAL_COMMUNITY): Payer: Self-pay

## 2017-01-12 ENCOUNTER — Encounter: Payer: BC Managed Care – PPO | Attending: General Surgery | Admitting: Skilled Nursing Facility1

## 2017-01-12 DIAGNOSIS — Z6841 Body Mass Index (BMI) 40.0 and over, adult: Secondary | ICD-10-CM

## 2017-01-12 DIAGNOSIS — Z713 Dietary counseling and surveillance: Secondary | ICD-10-CM | POA: Insufficient documentation

## 2017-01-13 ENCOUNTER — Encounter: Payer: Self-pay | Admitting: Skilled Nursing Facility1

## 2017-01-13 NOTE — Progress Notes (Signed)
Bariatric Class:  Appt start time: 1530 end time:  1630.  2 Week Post-Operative Nutrition Class  Patient was seen on 01/12/2017 for Post-Operative Nutrition education at [facility].   Surgery date: 12/29/2016 Surgery type: RYGB converted from Sleeve Start weight at [facility]: 285 Weight today: 225.2  TANITA  BODY COMP RESULTS  01/12/2017   BMI (kg/m^2) 38.7   Fat Mass (lbs) 103.8   Fat Free Mass (lbs) 121.4   Total Body Water (lbs) 88.2   The following the learning objectives were met by the patient during this course:  Identifies Phase 3A (Soft, High Proteins) Dietary Goals and will begin from 2 weeks post-operatively to 2 months post-operatively  Identifies appropriate sources of fluids and proteins   States protein recommendations and appropriate sources post-operatively  Identifies the need for appropriate texture modifications, mastication, and bite sizes when consuming solids  Identifies appropriate multivitamin and calcium sources post-operatively  Describes the need for physical activity post-operatively and will follow MD recommendations  States when to call healthcare provider regarding medication questions or post-operative complications  Handouts given during class include:  Phase 3A: Soft, High Protein Diet Handout  Follow-Up Plan: Patient will follow-up at [facility] in 6 weeks for 2 month post-op nutrition visit for diet advancement per MD.

## 2017-02-18 ENCOUNTER — Encounter: Payer: BC Managed Care – PPO | Attending: General Surgery | Admitting: Skilled Nursing Facility1

## 2017-02-18 DIAGNOSIS — Z713 Dietary counseling and surveillance: Secondary | ICD-10-CM | POA: Insufficient documentation

## 2017-02-21 ENCOUNTER — Inpatient Hospital Stay (HOSPITAL_COMMUNITY)
Admission: EM | Admit: 2017-02-21 | Discharge: 2017-02-26 | DRG: 419 | Disposition: A | Discharge status: Home / Self Care | Payer: BC Managed Care – PPO | Attending: General Surgery | Admitting: General Surgery

## 2017-02-21 ENCOUNTER — Other Ambulatory Visit: Payer: Self-pay

## 2017-02-21 ENCOUNTER — Encounter (HOSPITAL_COMMUNITY): Payer: Self-pay | Admitting: Emergency Medicine

## 2017-02-21 DIAGNOSIS — Z8249 Family history of ischemic heart disease and other diseases of the circulatory system: Secondary | ICD-10-CM

## 2017-02-21 DIAGNOSIS — Z6836 Body mass index (BMI) 36.0-36.9, adult: Secondary | ICD-10-CM

## 2017-02-21 DIAGNOSIS — K7581 Nonalcoholic steatohepatitis (NASH): Secondary | ICD-10-CM | POA: Diagnosis present

## 2017-02-21 DIAGNOSIS — Z90710 Acquired absence of both cervix and uterus: Secondary | ICD-10-CM

## 2017-02-21 DIAGNOSIS — N83201 Unspecified ovarian cyst, right side: Secondary | ICD-10-CM | POA: Diagnosis present

## 2017-02-21 DIAGNOSIS — Z91013 Allergy to seafood: Secondary | ICD-10-CM

## 2017-02-21 DIAGNOSIS — Z888 Allergy status to other drugs, medicaments and biological substances status: Secondary | ICD-10-CM

## 2017-02-21 DIAGNOSIS — R12 Heartburn: Secondary | ICD-10-CM | POA: Diagnosis present

## 2017-02-21 DIAGNOSIS — Z833 Family history of diabetes mellitus: Secondary | ICD-10-CM

## 2017-02-21 DIAGNOSIS — R300 Dysuria: Secondary | ICD-10-CM | POA: Diagnosis not present

## 2017-02-21 DIAGNOSIS — F419 Anxiety disorder, unspecified: Secondary | ICD-10-CM | POA: Diagnosis not present

## 2017-02-21 DIAGNOSIS — Z6841 Body Mass Index (BMI) 40.0 and over, adult: Secondary | ICD-10-CM

## 2017-02-21 DIAGNOSIS — Z79899 Other long term (current) drug therapy: Secondary | ICD-10-CM

## 2017-02-21 DIAGNOSIS — R3915 Urgency of urination: Secondary | ICD-10-CM | POA: Diagnosis not present

## 2017-02-21 DIAGNOSIS — N801 Endometriosis of ovary: Secondary | ICD-10-CM | POA: Diagnosis present

## 2017-02-21 DIAGNOSIS — Z880 Allergy status to penicillin: Secondary | ICD-10-CM

## 2017-02-21 DIAGNOSIS — K828 Other specified diseases of gallbladder: Principal | ICD-10-CM | POA: Diagnosis present

## 2017-02-21 DIAGNOSIS — K219 Gastro-esophageal reflux disease without esophagitis: Secondary | ICD-10-CM | POA: Diagnosis present

## 2017-02-21 DIAGNOSIS — K810 Acute cholecystitis: Secondary | ICD-10-CM

## 2017-02-21 DIAGNOSIS — K811 Chronic cholecystitis: Secondary | ICD-10-CM | POA: Diagnosis present

## 2017-02-21 DIAGNOSIS — L209 Atopic dermatitis, unspecified: Secondary | ICD-10-CM | POA: Diagnosis present

## 2017-02-21 DIAGNOSIS — R1011 Right upper quadrant pain: Secondary | ICD-10-CM

## 2017-02-21 DIAGNOSIS — E876 Hypokalemia: Secondary | ICD-10-CM | POA: Diagnosis present

## 2017-02-21 DIAGNOSIS — N83202 Unspecified ovarian cyst, left side: Secondary | ICD-10-CM | POA: Diagnosis present

## 2017-02-21 DIAGNOSIS — Z9884 Bariatric surgery status: Secondary | ICD-10-CM

## 2017-02-21 DIAGNOSIS — Z91018 Allergy to other foods: Secondary | ICD-10-CM

## 2017-02-21 DIAGNOSIS — N736 Female pelvic peritoneal adhesions (postinfective): Secondary | ICD-10-CM | POA: Diagnosis present

## 2017-02-21 DIAGNOSIS — R52 Pain, unspecified: Secondary | ICD-10-CM

## 2017-02-21 DIAGNOSIS — Z825 Family history of asthma and other chronic lower respiratory diseases: Secondary | ICD-10-CM

## 2017-02-21 MED ORDER — ONDANSETRON HCL 4 MG/2ML IJ SOLN
4.0000 mg | Freq: Once | INTRAMUSCULAR | Status: AC
  Administered 2017-02-21: 4 mg via INTRAVENOUS
  Filled 2017-02-21: qty 2

## 2017-02-21 MED ORDER — FENTANYL CITRATE (PF) 100 MCG/2ML IJ SOLN
50.0000 ug | INTRAMUSCULAR | Status: DC | PRN
  Administered 2017-02-21: 50 ug via INTRAVENOUS
  Filled 2017-02-21 (×2): qty 2

## 2017-02-21 NOTE — ED Notes (Signed)
Patient ambulatory to restroom with assistance

## 2017-02-21 NOTE — ED Triage Notes (Signed)
Patient c/o RUQ pain with nausea since tonight. Denies V/D. Reports having bariatric conversion from sleeve to bypass x6 weeks ago.

## 2017-02-22 ENCOUNTER — Emergency Department (HOSPITAL_COMMUNITY): Payer: BC Managed Care – PPO

## 2017-02-22 ENCOUNTER — Ambulatory Visit: Payer: Self-pay | Admitting: Skilled Nursing Facility1

## 2017-02-22 ENCOUNTER — Inpatient Hospital Stay (HOSPITAL_COMMUNITY): Payer: BC Managed Care – PPO

## 2017-02-22 ENCOUNTER — Encounter (HOSPITAL_COMMUNITY): Payer: Self-pay

## 2017-02-22 DIAGNOSIS — R300 Dysuria: Secondary | ICD-10-CM | POA: Diagnosis not present

## 2017-02-22 DIAGNOSIS — Z825 Family history of asthma and other chronic lower respiratory diseases: Secondary | ICD-10-CM | POA: Diagnosis not present

## 2017-02-22 DIAGNOSIS — K7581 Nonalcoholic steatohepatitis (NASH): Secondary | ICD-10-CM | POA: Diagnosis present

## 2017-02-22 DIAGNOSIS — R12 Heartburn: Secondary | ICD-10-CM | POA: Diagnosis present

## 2017-02-22 DIAGNOSIS — K828 Other specified diseases of gallbladder: Secondary | ICD-10-CM | POA: Diagnosis present

## 2017-02-22 DIAGNOSIS — Z91018 Allergy to other foods: Secondary | ICD-10-CM | POA: Diagnosis not present

## 2017-02-22 DIAGNOSIS — Z91013 Allergy to seafood: Secondary | ICD-10-CM | POA: Diagnosis not present

## 2017-02-22 DIAGNOSIS — Z8249 Family history of ischemic heart disease and other diseases of the circulatory system: Secondary | ICD-10-CM | POA: Diagnosis not present

## 2017-02-22 DIAGNOSIS — N83202 Unspecified ovarian cyst, left side: Secondary | ICD-10-CM | POA: Diagnosis present

## 2017-02-22 DIAGNOSIS — Z888 Allergy status to other drugs, medicaments and biological substances status: Secondary | ICD-10-CM | POA: Diagnosis not present

## 2017-02-22 DIAGNOSIS — N83201 Unspecified ovarian cyst, right side: Secondary | ICD-10-CM | POA: Diagnosis present

## 2017-02-22 DIAGNOSIS — F419 Anxiety disorder, unspecified: Secondary | ICD-10-CM | POA: Diagnosis present

## 2017-02-22 DIAGNOSIS — L209 Atopic dermatitis, unspecified: Secondary | ICD-10-CM | POA: Diagnosis present

## 2017-02-22 DIAGNOSIS — K811 Chronic cholecystitis: Secondary | ICD-10-CM | POA: Diagnosis present

## 2017-02-22 DIAGNOSIS — N801 Endometriosis of ovary: Secondary | ICD-10-CM | POA: Diagnosis present

## 2017-02-22 DIAGNOSIS — Z9884 Bariatric surgery status: Secondary | ICD-10-CM | POA: Diagnosis not present

## 2017-02-22 DIAGNOSIS — K219 Gastro-esophageal reflux disease without esophagitis: Secondary | ICD-10-CM | POA: Diagnosis present

## 2017-02-22 DIAGNOSIS — R3915 Urgency of urination: Secondary | ICD-10-CM | POA: Diagnosis not present

## 2017-02-22 DIAGNOSIS — N736 Female pelvic peritoneal adhesions (postinfective): Secondary | ICD-10-CM | POA: Diagnosis present

## 2017-02-22 DIAGNOSIS — Z880 Allergy status to penicillin: Secondary | ICD-10-CM | POA: Diagnosis not present

## 2017-02-22 DIAGNOSIS — Z90710 Acquired absence of both cervix and uterus: Secondary | ICD-10-CM | POA: Diagnosis not present

## 2017-02-22 DIAGNOSIS — Z833 Family history of diabetes mellitus: Secondary | ICD-10-CM | POA: Diagnosis not present

## 2017-02-22 DIAGNOSIS — R1011 Right upper quadrant pain: Secondary | ICD-10-CM | POA: Diagnosis present

## 2017-02-22 DIAGNOSIS — Z6836 Body mass index (BMI) 36.0-36.9, adult: Secondary | ICD-10-CM | POA: Diagnosis not present

## 2017-02-22 LAB — COMPREHENSIVE METABOLIC PANEL
ALBUMIN: 4.5 g/dL (ref 3.5–5.0)
ALT: 21 U/L (ref 14–54)
AST: 31 U/L (ref 15–41)
Alkaline Phosphatase: 72 U/L (ref 38–126)
Anion gap: 7 (ref 5–15)
BUN: 8 mg/dL (ref 6–20)
CHLORIDE: 108 mmol/L (ref 101–111)
CO2: 26 mmol/L (ref 22–32)
CREATININE: 0.73 mg/dL (ref 0.44–1.00)
Calcium: 9.1 mg/dL (ref 8.9–10.3)
GFR calc Af Amer: >60 mL/min (ref >60)
GLUCOSE: 89 mg/dL (ref 65–99)
POTASSIUM: 3.3 mmol/L — AB (ref 3.5–5.1)
Sodium: 141 mmol/L (ref 135–145)
Total Bilirubin: 0.7 mg/dL (ref 0.3–1.2)
Total Protein: 7.7 g/dL (ref 6.5–8.1)

## 2017-02-22 LAB — BASIC METABOLIC PANEL
ANION GAP: 5 (ref 5–15)
BUN: 6 mg/dL (ref 6–20)
CHLORIDE: 108 mmol/L (ref 101–111)
CO2: 28 mmol/L (ref 22–32)
Calcium: 8.6 mg/dL — ABNORMAL LOW (ref 8.9–10.3)
Creatinine, Ser: 0.66 mg/dL (ref 0.44–1.00)
GFR calc non Af Amer: >60 mL/min (ref >60)
Glucose, Bld: 85 mg/dL (ref 65–99)
POTASSIUM: 3.1 mmol/L — AB (ref 3.5–5.1)
SODIUM: 141 mmol/L (ref 135–145)

## 2017-02-22 LAB — URINALYSIS, ROUTINE W REFLEX MICROSCOPIC
BILIRUBIN URINE: NEGATIVE
GLUCOSE, UA: NEGATIVE mg/dL
HGB URINE DIPSTICK: NEGATIVE
KETONES UR: 5 mg/dL — AB
LEUKOCYTES UA: NEGATIVE
Nitrite: NEGATIVE
PH: 5 (ref 5.0–8.0)
PROTEIN: NEGATIVE mg/dL
Specific Gravity, Urine: 1.021 (ref 1.005–1.030)

## 2017-02-22 LAB — LIPASE, BLOOD: LIPASE: 40 U/L (ref 11–51)

## 2017-02-22 LAB — CBC
HEMATOCRIT: 38 % (ref 36.0–46.0)
HEMATOCRIT: 42.2 % (ref 36.0–46.0)
HEMOGLOBIN: 12.4 g/dL (ref 12.0–15.0)
Hemoglobin: 14.2 g/dL (ref 12.0–15.0)
MCH: 28.6 pg (ref 26.0–34.0)
MCH: 29.3 pg (ref 26.0–34.0)
MCHC: 32.6 g/dL (ref 30.0–36.0)
MCHC: 33.6 g/dL (ref 30.0–36.0)
MCV: 87.8 fL (ref 78.0–100.0)
MCV: 87.2 fL (ref 78.0–100.0)
PLATELETS: 292 10*3/uL (ref 150–400)
PLATELETS: 375 10*3/uL (ref 150–400)
RBC: 4.33 MIL/uL (ref 3.87–5.11)
RBC: 4.84 MIL/uL (ref 3.87–5.11)
RDW: 13.7 % (ref 11.5–15.5)
RDW: 13.5 % (ref 11.5–15.5)
WBC: 12.2 10*3/uL — ABNORMAL HIGH (ref 4.0–10.5)
WBC: 8.1 10*3/uL (ref 4.0–10.5)

## 2017-02-22 LAB — I-STAT BETA HCG BLOOD, ED (MC, WL, AP ONLY): I-stat hCG, quantitative: <5 m[IU]/mL (ref <5)

## 2017-02-22 MED ORDER — ONDANSETRON 4 MG PO TBDP: 4.0000 mg | ORAL_TABLET | Freq: Four times a day (QID) | ORAL | Status: DC | PRN

## 2017-02-22 MED ORDER — ONDANSETRON HCL 4 MG/2ML IJ SOLN
4.0000 mg | Freq: Four times a day (QID) | INTRAMUSCULAR | Status: DC | PRN
  Administered 2017-02-22 – 2017-02-23 (×4): 4 mg via INTRAVENOUS
  Filled 2017-02-22 (×4): qty 2

## 2017-02-22 MED ORDER — TECHNETIUM TC 99M MEBROFENIN IV KIT
5.4000 | PACK | Freq: Once | INTRAVENOUS | Status: AC | PRN
  Administered 2017-02-22: 5.4 via INTRAVENOUS

## 2017-02-22 MED ORDER — POTASSIUM CHLORIDE 2 MEQ/ML IV SOLN
INTRAVENOUS | Status: DC
  Administered 2017-02-22 – 2017-02-23 (×2): via INTRAVENOUS
  Filled 2017-02-22 (×5): qty 1000

## 2017-02-22 MED ORDER — PANTOPRAZOLE SODIUM 40 MG IV SOLR
40.0000 mg | Freq: Every day | INTRAVENOUS | Status: DC
  Administered 2017-02-22: 40 mg via INTRAVENOUS
  Filled 2017-02-22: qty 40

## 2017-02-22 MED ORDER — FENTANYL CITRATE (PF) 100 MCG/2ML IJ SOLN
50.0000 ug | Freq: Once | INTRAMUSCULAR | Status: AC
  Administered 2017-02-22: 50 ug via INTRAVENOUS

## 2017-02-22 MED ORDER — HYDROMORPHONE HCL 1 MG/ML IJ SOLN
1.0000 mg | INTRAMUSCULAR | Status: DC | PRN
  Administered 2017-02-22 – 2017-02-23 (×7): 1 mg via INTRAVENOUS
  Filled 2017-02-22 (×7): qty 1

## 2017-02-22 MED ORDER — IOPAMIDOL (ISOVUE-300) INJECTION 61%
100.0000 mL | Freq: Once | INTRAVENOUS | Status: AC | PRN
  Administered 2017-02-22: 100 mL via INTRAVENOUS

## 2017-02-22 MED ORDER — IOPAMIDOL (ISOVUE-300) INJECTION 61%
INTRAVENOUS | Status: AC
Start: 2017-02-22 — End: 2017-02-22
  Filled 2017-02-22: qty 100

## 2017-02-22 MED ORDER — IOPAMIDOL (ISOVUE-300) INJECTION 61%
INTRAVENOUS | Status: AC
  Filled 2017-02-22: qty 30

## 2017-02-22 MED ORDER — IOPAMIDOL (ISOVUE-300) INJECTION 61%
15.0000 mL | Freq: Once | INTRAVENOUS | Status: AC | PRN
  Administered 2017-02-22: 15 mL via ORAL

## 2017-02-22 MED ORDER — HYDROMORPHONE HCL 1 MG/ML IJ SOLN
1.0000 mg | Freq: Once | INTRAMUSCULAR | Status: AC
Start: 2017-02-22 — End: 2017-02-22
  Administered 2017-02-22: 1 mg via INTRAVENOUS
  Filled 2017-02-22: qty 1

## 2017-02-22 MED ORDER — HEPARIN SODIUM (PORCINE) 5000 UNIT/ML IJ SOLN
5000.0000 [IU] | Freq: Three times a day (TID) | INTRAMUSCULAR | Status: DC
  Administered 2017-02-22 – 2017-02-23 (×3): 5000 [IU] via SUBCUTANEOUS
  Filled 2017-02-22 (×3): qty 1

## 2017-02-22 NOTE — ED Notes (Signed)
Patient transported to CT 

## 2017-02-22 NOTE — ED Provider Notes (Signed)
[facility] DEPT Provider Note   CSN: [id number] Arrival date & time: 02/21/17  2151     History   Chief Complaint Chief Complaint  Patient presents with  . Abdominal Pain    HPI Mary Williams is a 41 y.o. female past medical history of anxiety, GERD, history of gastric sleeve with Roux-en-Y bypass who presents today for evaluation of right upper quadrant abdominal pain that began approximately 6 PM this afternoon.  Patient reports that since then, patient has progressively worsened.  She describes it as a "constant stabbing" and states that she has had some associated nausea.  Patient reports that she attempted to eat dinner but could not because of the pain.  She has not taken any medications for the pain.  Patient states that she called her surgeon [facility]) who advised going to the ED.  Patient recently had a Roux-en-Y revision of her gastric sleeve in the end of November 2018.  Patient reports that she had been experiencing postprandial vomiting from her previous gastric sleeve and had elected for revision.  The patient has not experience any complications from that prior surgery.  Patient reports that she has been having diarrhea for the last day. No blood in stool. Patient denies any fevers, chest pain, difficulty breathing, dysuria, hematuria, vomiting.   The history is provided by the patient.    Past Medical History:  Diagnosis Date  . Anxiety   . Cancer ([place])    mOLAR PREGNACY CANCEROUS REMOVED  . GERD (gastroesophageal reflux disease)   . History of hiatal hernia     Patient Active Problem List   Diagnosis Date Noted  . Right upper quadrant abdominal pain 02/22/2017  . GERD with esophagitis 12/29/2016  . Abdominal pain 07/27/2016  . Morbid obesity with BMI of 50.0-59.9, adult ([place]) 11/12/2015    Past Surgical History:  Procedure Laterality Date  . ABDOMINAL HYSTERECTOMY     ovaries left  . APPENDECTOMY    . DILATION AND  CURETTAGE OF UTERUS    . ESOPHAGOGASTRODUODENOSCOPY     possible dilation 12/14/16 Dr. Lucia Gaskins  . ESOPHAGOGASTRODUODENOSCOPY (EGD) WITH PROPOFOL N/A 12/14/2016   Procedure: ESOPHAGOGASTRODUODENOSCOPY (EGD) WITH PROPOFOL WITH POSSIBLE DILATATION;  Surgeon: Alphonsa Overall, MD;  Location: [facility] END[facility]COPY;  Service: General;  Laterality: N/A;  . HERNIA REPAIR    . LAPAROSCOPIC GASTRIC SLEEVE RESECTION WITH HIATAL HERNIA REPAIR N/A 11/12/2015   Procedure: LAPAROSCOPIC GASTRIC SLEEVE RESECTION WITH HIATAL HERNIA REPAIR WITH UPPER ENDO;  Surgeon: Excell Seltzer, MD;  Location: [facility] ORS;  Service: General;  Laterality: N/A;  . LAPAROSCOPIC ROUX-EN-Y GASTRIC BYPASS WITH HIATAL HERNIA REPAIR  12/29/2016   Procedure: LAPAROSCOPIC ROUX-EN-Y GASTRIC BYPASS WITH HIATAL HERNIA REPAIR;  Surgeon: Excell Seltzer, MD;  Location: [facility] ORS;  Service: General;;  . LAPAROSCOPY N/A 07/29/2016   Procedure: LAPAROSCOPY DIAGNOSTIC with appendectomy and lysis of adhesions;  Surgeon: Judeth Horn, MD;  Location: [facility];  Service: General;  Laterality: N/A;  . UPPER GI ENDOSCOPY  11/12/2015   Procedure: UPPER GI ENDOSCOPY;  Surgeon: Excell Seltzer, MD;  Location: [facility] ORS;  Service: General;;  . UPPER GI ENDOSCOPY  12/29/2016   Procedure: UPPER GI ENDOSCOPY;  Surgeon: Excell Seltzer, MD;  Location: [facility] ORS;  Service: General;;    OB History    No data available       Home Medications    Prior to Admission medications   Medication Sig Start Date End Date Taking? Authorizing Provider  acetaminophen (TYLENOL) 500 MG tablet  Take 1,000 mg by mouth every 8 (eight) hours as needed for mild pain or headache.   Yes [provider]  calcium carbonate (TUMS - DOSED IN MG ELEMENTAL CALCIUM) 500 MG chewable tablet Chew 3 tablets by mouth daily.    Yes [provider]  escitalopram (LEXAPRO) 20 MG tablet Take 20 mg by mouth at bedtime.    Yes [provider]  Multiple Vitamins-Minerals (BARIATRIC  MULTIVITAMINS/IRON PO) Take 1 capsule by mouth daily.   Yes [provider]  pantoprazole (PROTONIX) 40 MG tablet Take 40 mg by mouth daily after breakfast.  11/06/16  Yes [provider]    Family History Family History  Problem Relation Age of Onset  . Hypertension Other   . Asthma Other   . Cancer Other   . Diabetes Other     Social History Social History   Tobacco Use  . Smoking status: Never Smoker  . Smokeless tobacco: Never Used  Substance Use Topics  . Alcohol use: Yes    Comment: occ  . Drug use: No     Allergies   Fish allergy; Penicillins; Shellfish allergy; and Ceclor [cefaclor]   Review of Systems Review of Systems  Constitutional: Negative for chills and fever.  Respiratory: Negative for cough and shortness of breath.   Cardiovascular: Negative for chest pain.  Gastrointestinal: Positive for abdominal pain, diarrhea and nausea. Negative for vomiting.  Genitourinary: Negative for dysuria and hematuria.  Musculoskeletal: Negative for back pain and neck pain.     Physical Exam Updated Vital Signs BP (!) 137/101   Pulse 66   Temp 97.8 F (36.6 C) (Oral)   Resp (!) 22   SpO2 98%   Physical Exam  Constitutional: She is oriented to person, place, and time. She appears well-developed and well-nourished.  Appears uncomfortable but no acute distress   HENT:  Head: Normocephalic and atraumatic.  Mouth/Throat: Oropharynx is clear and moist and mucous membranes are normal.  Eyes: Conjunctivae, EOM and lids are normal. Pupils are equal, round, and reactive to light.  Neck: Full passive range of motion without pain.  Cardiovascular: Normal rate, regular rhythm, normal heart sounds and normal pulses. Exam reveals no gallop and no friction rub.  No murmur heard. Pulmonary/Chest: Effort normal and breath sounds normal.  Abdominal: Soft. Normal appearance. She exhibits no distension. Bowel sounds are decreased. There is tenderness in the right  upper quadrant. There is positive Murphy's sign. There is no rigidity, no guarding, no CVA tenderness and no tenderness at McBurney's point.  Abdomen is soft, non-distended. She does exhibit tenderness to palpation of the RUQ. No McBurney's Point tenderness.   Musculoskeletal: Normal range of motion.  Neurological: She is alert and oriented to person, place, and time.  Skin: Skin is warm and dry. Capillary refill takes less than 2 seconds.  Psychiatric: She has a normal mood and affect. Her speech is normal.  Nursing note and vitals reviewed.    ED Treatments / Results  Labs (all labs ordered are listed, but only abnormal results are displayed) Labs Reviewed  COMPREHENSIVE METABOLIC PANEL - Abnormal; Notable for the following components:      Result Value   Potassium 3.3 (*)    All other components within normal limits  CBC - Abnormal; Notable for the following components:   WBC 12.2 (*)    All other components within normal limits  URINALYSIS, ROUTINE W REFLEX MICROSCOPIC - Abnormal; Notable for the following components:   Ketones, ur 5 (*)  All other components within normal limits  LIPASE, BLOOD  I-STAT BETA HCG BLOOD, ED ([facility], [facility], [facility] ONLY)    EKG  EKG Interpretation None       Radiology US Abdomen Limited Ruq  Result Date: 02/22/2017 CLINICAL DATA:  Right upper quadrant pain for 1 day. Bariatric surgery 6 weeks ago. EXAM: ULTRASOUND ABDOMEN LIMITED RIGHT UPPER QUADRANT COMPARISON:  CT abdomen and pelvis 07/26/2016 FINDINGS: Gallbladder: No gallstones or wall thickening visualized. No sonographic Murphy sign noted by sonographer. Common bile duct: Diameter: 4.1 mm, normal Liver: Mild diffuse increased parenchymal echotexture suggesting fatty infiltration. No focal lesions identified. Portal vein is patent on color Doppler imaging with normal direction of blood flow towards the liver. IMPRESSION: No evidence of cholelithiasis or cholecystitis. Mild fatty infiltration of the  liver. Electronically Signed   By: Lucienne Capers M.D.   On: 02/22/2017 03:55    Procedures Procedures (including critical care time)  Medications Ordered in ED Medications  HYDROmorphone (DILAUDID) injection 1 mg (1 mg Intravenous Given 02/22/17 0518)  ondansetron (ZOFRAN-ODT) disintegrating tablet 4 mg ( Oral See Alternative 02/22/17 0518)    Or  ondansetron (ZOFRAN) injection 4 mg (4 mg Intravenous Given 02/22/17 0518)  iopamidol (ISOVUE-300) 61 % injection 15 mL (not administered)  ondansetron (ZOFRAN) injection 4 mg (4 mg Intravenous Given 02/21/17 2357)  fentaNYL (SUBLIMAZE) injection 50 mcg (50 mcg Intravenous Given 02/22/17 0109)  HYDROmorphone (DILAUDID) injection 1 mg (1 mg Intravenous Given 02/22/17 0215)     Initial Impression / Assessment and Plan / ED Course  I have reviewed the triage vital signs and the nursing notes.  Pertinent labs & imaging results that were available during my care of the patient were reviewed by me and considered in my medical decision making (see chart for details).     41 year old female past medical history of gastric sleeve with revision to Roux-en-Y procedure in November 2018 who presents for evaluation of right upper quadrant pain that began at 6 PM.  Associated with nausea.  No vomiting.  She has had diarrhea for the last 2 days.  On physical exam, patient has tenderness to palpation to the right upper quadrant.  No McBurney's point tenderness. Patient is afebrile, non-toxic appearing. Vital signs reviewed and stable.  Consider gallbladder etiology versus acute infectious etiology.  Initial labs ordered at triage.  We will plan to do right upper quadrant ultrasound for further evaluation.  Patient given additional analgesics in triage.  Will give additional pain medication here in the department.  Labs reviewed.  I-STAT beta Deneise Lever.  Lipase unremarkable.  CBC shows leukocytosis of 12.2.  CMP shows slight hypokalemia but otherwise  unremarkable.  UA is unremarkable.  Right upper quadrant ultrasound pending.  Patient reports no improvement after first round of analgesics.  Will give additional analgesics.  Right upper quadrant ultrasound pending.  Right upper quadrant ultrasound shows no evidence of cholecystitis or cholelithiasis.  Plan to consult surgery for further evaluation.  Discussed patient with Dr. Excell Seltzer (Gen Surgery).  He has personally reviewed the ultrasound.  Given concerns of patient's pain, will plan to admit.  Would like CT abdomen pelvis for further evaluation.   Final Clinical Impressions(s) / ED Diagnoses   Final diagnoses:  RUQ abdominal pain    ED Discharge Orders    None       Volanda Napoleon, PA-C 02/22/17 0533    Veryl Speak, MD 02/22/17 [phone]

## 2017-02-22 NOTE — H&P (Signed)
Mary Williams is an 41 y.o. female.    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  Her surgery and postoperative course has been uncomplicated.  She states she was doing well until about 8 hours ago when she had the fairly rapid onset of right upper quadrant abdominal pain.  She thought eating something might help and after she ate the pain became much more severe.  She describes constant aching pain with exacerbations.  The pain is located in her epigastrium and right upper quadrant and radiating somewhat around to the right flank.  She has had dry heaves.  She said she felt chilled.  No fever.  No urinary symptoms.  Bowel movements normal.  Past Medical History:  Diagnosis Date  . Anxiety   . Cancer ([place])    mOLAR PREGNACY CANCEROUS REMOVED  . GERD (gastroesophageal reflux disease)   . History of hiatal hernia     Past Surgical History:  Procedure Laterality Date  . ABDOMINAL HYSTERECTOMY     ovaries left  . APPENDECTOMY    . DILATION AND CURETTAGE OF UTERUS    . ESOPHAGOGASTRODUODENOSCOPY     possible dilation 12/14/16 Dr. Lucia Gaskins  . ESOPHAGOGASTRODUODENOSCOPY (EGD) WITH PROPOFOL N/A 12/14/2016   Procedure: ESOPHAGOGASTRODUODENOSCOPY (EGD) WITH PROPOFOL WITH POSSIBLE DILATATION;  Surgeon: Alphonsa Overall, MD;  Location: [facility];  Service: General;  Laterality: N/A;  . HERNIA REPAIR    . LAPAROSCOPIC GASTRIC SLEEVE RESECTION WITH HIATAL HERNIA REPAIR N/A 11/12/2015   Procedure: LAPAROSCOPIC GASTRIC SLEEVE RESECTION WITH HIATAL HERNIA REPAIR WITH UPPER ENDO;  Surgeon: Excell Seltzer, MD;  Location: [facility];  Service: General;  Laterality: N/A;  . LAPAROSCOPIC ROUX-EN-Y GASTRIC BYPASS WITH HIATAL HERNIA REPAIR  12/29/2016   Procedure: LAPAROSCOPIC ROUX-EN-Y GASTRIC BYPASS WITH HIATAL HERNIA REPAIR;  Surgeon: Excell Seltzer, MD;  Location: [facility];   Service: General;;  . LAPAROSCOPY N/A 07/29/2016   Procedure: LAPAROSCOPY DIAGNOSTIC with appendectomy and lysis of adhesions;  Surgeon: Judeth Horn, MD;  Location: [place];  Service: General;  Laterality: N/A;  . UPPER GI ENDOSCOPY  11/12/2015   Procedure: UPPER GI ENDOSCOPY;  Surgeon: Excell Seltzer, MD;  Location: [facility];  Service: General;;  . UPPER GI ENDOSCOPY  12/29/2016   Procedure: UPPER GI ENDOSCOPY;  Surgeon: Excell Seltzer, MD;  Location: [facility];  Service: General;;    Family History  Problem Relation Age of Onset  . Hypertension Other   . Asthma Other   . Cancer Other   . Diabetes Other    Social History:  reports that  has never smoked. she has never used smokeless tobacco. She reports that she drinks alcohol. She reports that she does not use drugs.  Allergies:  Allergies  Allergen Reactions  . Fish Allergy Anaphylaxis  . Penicillins Anaphylaxis    Has patient had a PCN reaction causing immediate rash, facial/tongue/throat swelling, SOB or lightheadedness with hypotension: Yes Has patient had a PCN reaction causing severe rash involving mucus membranes or skin necrosis: No Has patient had a PCN reaction that required hospitalization No Has patient had a PCN reaction occurring within the last 10 years: No If all of the above answers are "NO", then may proceed with Cephalosporin use.   . Shellfish Allergy Anaphylaxis  . Ceclor [Cefaclor] Hives   Current Facility-Administered Medications  Medication Dose Route Frequency Provider Last Rate Last Dose  . fentaNYL (SUBLIMAZE) injection  50 mcg  50 mcg Intravenous Q30 min PRN Veryl Speak, MD   50 mcg at 02/21/17 2357   Current Outpatient Medications  Medication Sig Dispense Refill  . acetaminophen (TYLENOL) 500 MG tablet Take 1,000 mg by mouth every 8 (eight) hours as needed for mild pain or headache.    . calcium carbonate (TUMS - DOSED IN MG ELEMENTAL CALCIUM) 500 MG chewable tablet Chew 3 tablets by mouth daily.      Marland Kitchen escitalopram (LEXAPRO) 20 MG tablet Take 20 mg by mouth at bedtime.     . Multiple Vitamins-Minerals (BARIATRIC MULTIVITAMINS/IRON PO) Take 1 capsule by mouth daily.    . pantoprazole (PROTONIX) 40 MG tablet Take 40 mg by mouth daily after breakfast.        Results for orders placed or performed during the hospital encounter of 02/21/17 (from the past 48 hour(s))  Lipase, blood     Status: None   Collection Time: 02/21/17 11:52 PM  Result Value Ref Range   Lipase 40 11 - 51 U/L  Comprehensive metabolic panel     Status: Abnormal   Collection Time: 02/21/17 11:52 PM  Result Value Ref Range   Sodium 141 135 - 145 mmol/L   Potassium 3.3 (L) 3.5 - 5.1 mmol/L   Chloride 108 101 - 111 mmol/L   CO2 26 22 - 32 mmol/L   Glucose, Bld 89 65 - 99 mg/dL   BUN 8 6 - 20 mg/dL   Creatinine, Ser 0.73 0.44 - 1.00 mg/dL   Calcium 9.1 8.9 - 10.3 mg/dL   Total Protein 7.7 6.5 - 8.1 g/dL   Albumin 4.5 3.5 - 5.0 g/dL   AST 31 15 - 41 U/L   ALT 21 14 - 54 U/L   Alkaline Phosphatase 72 38 - 126 U/L   Total Bilirubin 0.7 0.3 - 1.2 mg/dL   GFR calc non Af Amer >60 >60 mL/min   GFR calc Af Amer >60 >60 mL/min    Comment: (NOTE) The eGFR has been calculated using the CKD EPI equation. This calculation has not been validated in all clinical situations. eGFR's persistently <60 mL/min signify possible Chronic Kidney Disease.    Anion gap 7 5 - 15  CBC     Status: Abnormal   Collection Time: 02/21/17 11:52 PM  Result Value Ref Range   WBC 12.2 (H) 4.0 - 10.5 K/uL   RBC 4.84 3.87 - 5.11 MIL/uL   Hemoglobin 14.2 12.0 - 15.0 g/dL   HCT 42.2 36.0 - 46.0 %   MCV 87.2 78.0 - 100.0 fL   MCH 29.3 26.0 - 34.0 pg   MCHC 33.6 30.0 - 36.0 g/dL   RDW 13.5 11.5 - 15.5 %   Platelets 375 150 - 400 K/uL  Urinalysis, Routine w reflex microscopic     Status: Abnormal   Collection Time: 02/21/17 11:52 PM  Result Value Ref Range   Color, Urine YELLOW YELLOW   APPearance CLEAR CLEAR   Specific Gravity, Urine  1.021 1.005 - 1.030   pH 5.0 5.0 - 8.0   Glucose, UA NEGATIVE NEGATIVE mg/dL   Hgb urine dipstick NEGATIVE NEGATIVE   Bilirubin Urine NEGATIVE NEGATIVE   Ketones, ur 5 (A) NEGATIVE mg/dL   Protein, ur NEGATIVE NEGATIVE mg/dL   Nitrite NEGATIVE NEGATIVE   Leukocytes, UA NEGATIVE NEGATIVE  I-Stat beta hCG blood, ED     Status: None   Collection Time: 02/22/17 12:10 AM  Result Value Ref Range   I-stat hCG,  quantitative <5.0 <5 mIU/mL   Comment 3            Comment:   GEST. AGE      CONC.  (mIU/mL)   <=1 WEEK        5 - 50     2 WEEKS       50 - 500     3 WEEKS       100 - 10,000     4 WEEKS     1,000 - 30,000        FEMALE AND NON-PREGNANT FEMALE:     LESS THAN 5 mIU/mL    US Abdomen Limited Ruq  Result Date: 02/22/2017 CLINICAL DATA:  Right upper quadrant pain for 1 day. Bariatric surgery 6 weeks ago. EXAM: ULTRASOUND ABDOMEN LIMITED RIGHT UPPER QUADRANT COMPARISON:  CT abdomen and pelvis 07/26/2016 FINDINGS: Gallbladder: No gallstones or wall thickening visualized. No sonographic Murphy sign noted by sonographer. Common bile duct: Diameter: 4.1 mm, normal Liver: Mild diffuse increased parenchymal echotexture suggesting fatty infiltration. No focal lesions identified. Portal vein is patent on color Doppler imaging with normal direction of blood flow towards the liver. IMPRESSION: No evidence of cholelithiasis or cholecystitis. Mild fatty infiltration of the liver. Electronically Signed   By: Lucienne Capers M.D.   On: 02/22/2017 03:55    ROS  Blood pressure 125/80, pulse 71, temperature 97.8 F (36.6 C), temperature source Oral, resp. rate 18, SpO2 100 %. Physical Exam  General: Alert moderately obese Caucasian female, in obvious pain Skin: Warm and dry without rash or infection. HEENT: No palpable masses or thyromegaly. Sclera nonicteric. Pupils equal round and reactive.  Lymph nodes: No cervical, supraclavicular,  nodes palpable. Lungs: Breath sounds clear and equal without  increased work of breathing Cardiovascular: Regular rate and rhythm without murmur. No JVD or edema. Peripheral pulses intact. Abdomen: Nondistended.  Hypoactive bowel sounds.  There is tenderness in the epigastrium and right upper quadrant with guarding.  Remainder of her abdomen is soft and nontender.   Extremities: No edema or joint swelling or deformity. No chronic venous stasis changes. Neurologic: Alert and fully oriented.  Affect normal.  No gross motor deficits.  Assessment/Plan 41 year old female who is 7 weeks status post Roux-en-Y gastric bypass, conversion from sleeve gastrectomy for reflux.  Her course to this point has been unremarkable.  She has the sudden onset of epigastric and right upper quadrant abdominal pain and nausea.  There is localized tenderness.  Laboratory remarkable only for mildly elevated white blood count.  Initially seemed highly suspicious for biliary colic or acute cholecystitis but ultrasound of her gallbladder is negative. Onset of her pain and timing after surgery does not seem typical for a leak but I suppose this is possible.  Possible bowel obstruction.  We will proceed with emergency CT scan of the abdomen and pelvis.  Edward Jolly, MD 02/22/2017, 4:36 AM

## 2017-02-23 ENCOUNTER — Inpatient Hospital Stay (HOSPITAL_COMMUNITY): Payer: BC Managed Care – PPO | Admitting: Anesthesiology

## 2017-02-23 ENCOUNTER — Encounter (HOSPITAL_COMMUNITY)
Admission: EM | Disposition: A | Discharge status: Home / Self Care | Payer: Self-pay | Source: Home / Self Care | Attending: General Surgery

## 2017-02-23 ENCOUNTER — Inpatient Hospital Stay (HOSPITAL_COMMUNITY): Payer: BC Managed Care – PPO

## 2017-02-23 ENCOUNTER — Encounter (HOSPITAL_COMMUNITY): Payer: Self-pay | Admitting: Anesthesiology

## 2017-02-23 DIAGNOSIS — K810 Acute cholecystitis: Secondary | ICD-10-CM

## 2017-02-23 HISTORY — PX: CHOLECYSTECTOMY: SHX55

## 2017-02-23 HISTORY — PX: LAPAROSCOPIC CHOLECYSTECTOMY SINGLE SITE WITH INTRAOPERATIVE CHOLANGIOGRAM: SHX6538

## 2017-02-23 HISTORY — PX: ESOPHAGOGASTRODUODENOSCOPY: SHX5428

## 2017-02-23 LAB — SURGICAL PCR SCREEN
MRSA, PCR: NEGATIVE
Staphylococcus aureus: NEGATIVE

## 2017-02-23 SURGERY — LAPAROSCOPIC CHOLECYSTECTOMY SINGLE SITE WITH INTRAOPERATIVE CHOLANGIOGRAM
Anesthesia: General | Site: Abdomen

## 2017-02-23 MED ORDER — BUPIVACAINE-EPINEPHRINE 0.25% -1:200000 IJ SOLN
INTRAMUSCULAR | Status: AC
  Filled 2017-02-23: qty 1

## 2017-02-23 MED ORDER — LIDOCAINE 2% (20 MG/ML) 5 ML SYRINGE
INTRAMUSCULAR | Status: DC | PRN
  Administered 2017-02-23: 80 mg via INTRAVENOUS

## 2017-02-23 MED ORDER — LACTATED RINGERS IR SOLN
Status: DC | PRN
  Administered 2017-02-23: 2000 mL

## 2017-02-23 MED ORDER — LIDOCAINE HCL 2 % IJ SOLN
INTRAMUSCULAR | Status: AC
  Filled 2017-02-23: qty 20

## 2017-02-23 MED ORDER — ONDANSETRON HCL 4 MG/2ML IJ SOLN
INTRAMUSCULAR | Status: DC | PRN
  Administered 2017-02-23: 4 mg via INTRAVENOUS

## 2017-02-23 MED ORDER — FENTANYL CITRATE (PF) 100 MCG/2ML IJ SOLN
INTRAMUSCULAR | Status: AC
  Filled 2017-02-23: qty 2

## 2017-02-23 MED ORDER — ACETAMINOPHEN 500 MG PO TABS
1000.0000 mg | ORAL_TABLET | Freq: Three times a day (TID) | ORAL | Status: DC
  Administered 2017-02-23 – 2017-02-26 (×9): 1000 mg via ORAL
  Filled 2017-02-23 (×9): qty 2

## 2017-02-23 MED ORDER — LACTATED RINGERS IV BOLUS (SEPSIS)
1000.0000 mL | Freq: Three times a day (TID) | INTRAVENOUS | Status: AC | PRN
  Administered 2017-02-23 (×2): via INTRAVENOUS

## 2017-02-23 MED ORDER — GABAPENTIN 300 MG PO CAPS
300.0000 mg | ORAL_CAPSULE | ORAL | Status: AC
  Administered 2017-02-23: 300 mg via ORAL
  Filled 2017-02-23: qty 1

## 2017-02-23 MED ORDER — ROCURONIUM BROMIDE 50 MG/5ML IV SOSY
PREFILLED_SYRINGE | INTRAVENOUS | Status: AC
  Filled 2017-02-23: qty 5

## 2017-02-23 MED ORDER — FENTANYL CITRATE (PF) 100 MCG/2ML IJ SOLN
25.0000 ug | INTRAMUSCULAR | Status: DC | PRN
  Administered 2017-02-23 (×3): 50 ug via INTRAVENOUS

## 2017-02-23 MED ORDER — MENTHOL 3 MG MT LOZG: 1.0000 | LOZENGE | OROMUCOSAL | Status: DC | PRN

## 2017-02-23 MED ORDER — LIDOCAINE 2% (20 MG/ML) 5 ML SYRINGE: INTRAMUSCULAR | Status: DC | PRN

## 2017-02-23 MED ORDER — SODIUM CHLORIDE 0.9 % IV SOLN
25.0000 mg | Freq: Four times a day (QID) | INTRAVENOUS | Status: DC | PRN
  Filled 2017-02-23: qty 1

## 2017-02-23 MED ORDER — EPHEDRINE 5 MG/ML INJ
INTRAVENOUS | Status: AC
  Filled 2017-02-23: qty 10

## 2017-02-23 MED ORDER — MIDAZOLAM HCL 2 MG/2ML IJ SOLN
INTRAMUSCULAR | Status: AC
  Filled 2017-02-23: qty 2

## 2017-02-23 MED ORDER — LIP MEDEX EX OINT
1.0000 "application " | TOPICAL_OINTMENT | Freq: Two times a day (BID) | CUTANEOUS | Status: DC
  Administered 2017-02-23 – 2017-02-26 (×6): 1 via TOPICAL
  Filled 2017-02-23 (×2): qty 7

## 2017-02-23 MED ORDER — LIDOCAINE 2% (20 MG/ML) 5 ML SYRINGE
INTRAMUSCULAR | Status: AC
  Filled 2017-02-23: qty 10

## 2017-02-23 MED ORDER — PROMETHAZINE HCL 25 MG/ML IJ SOLN
6.2500 mg | Freq: Once | INTRAMUSCULAR | Status: AC
  Administered 2017-02-23: 6.25 mg via INTRAVENOUS

## 2017-02-23 MED ORDER — ADULT MULTIVITAMIN W/MINERALS CH
1.0000 | ORAL_TABLET | Freq: Every day | ORAL | Status: DC
  Administered 2017-02-24 – 2017-02-26 (×3): 1 via ORAL
  Filled 2017-02-23 (×3): qty 1

## 2017-02-23 MED ORDER — ACETAMINOPHEN 325 MG PO TABS: 325.0000 mg | ORAL_TABLET | Freq: Four times a day (QID) | ORAL | Status: DC | PRN

## 2017-02-23 MED ORDER — ALUM & MAG HYDROXIDE-SIMETH 200-200-20 MG/5ML PO SUSP: 30.0000 mL | Freq: Four times a day (QID) | ORAL | Status: DC | PRN

## 2017-02-23 MED ORDER — HYDROCORTISONE 2.5 % RE CREA: 1.0000 "application " | TOPICAL_CREAM | Freq: Four times a day (QID) | RECTAL | Status: DC | PRN

## 2017-02-23 MED ORDER — PROPOFOL 10 MG/ML IV BOLUS
INTRAVENOUS | Status: DC | PRN
  Administered 2017-02-23: 170 mg via INTRAVENOUS

## 2017-02-23 MED ORDER — ROCURONIUM BROMIDE 50 MG/5ML IV SOSY
PREFILLED_SYRINGE | INTRAVENOUS | Status: AC
  Filled 2017-02-23: qty 10

## 2017-02-23 MED ORDER — METOPROLOL TARTRATE 5 MG/5ML IV SOLN: 5.0000 mg | Freq: Four times a day (QID) | INTRAVENOUS | Status: DC | PRN

## 2017-02-23 MED ORDER — PHENOL 1.4 % MT LIQD: 1.0000 | OROMUCOSAL | Status: DC | PRN

## 2017-02-23 MED ORDER — LIDOCAINE 2% (20 MG/ML) 5 ML SYRINGE
INTRAMUSCULAR | Status: DC | PRN
  Administered 2017-02-23: 4.1 mL/h via INTRAVENOUS

## 2017-02-23 MED ORDER — BUPIVACAINE-EPINEPHRINE 0.25% -1:200000 IJ SOLN
INTRAMUSCULAR | Status: DC | PRN
  Administered 2017-02-23: 50 mL

## 2017-02-23 MED ORDER — METHOCARBAMOL 1000 MG/10ML IJ SOLN: 1000.0000 mg | Freq: Four times a day (QID) | INTRAVENOUS | Status: DC | PRN

## 2017-02-23 MED ORDER — MAGIC MOUTHWASH
15.0000 mL | Freq: Four times a day (QID) | ORAL | Status: DC | PRN
  Filled 2017-02-23: qty 15

## 2017-02-23 MED ORDER — EPHEDRINE SULFATE-NACL 50-0.9 MG/10ML-% IV SOSY
PREFILLED_SYRINGE | INTRAVENOUS | Status: DC | PRN
  Administered 2017-02-23 (×2): 10 mg via INTRAVENOUS

## 2017-02-23 MED ORDER — HYDROMORPHONE HCL 1 MG/ML IJ SOLN
0.5000 mg | INTRAMUSCULAR | Status: DC | PRN
  Administered 2017-02-23: 1 mg via INTRAVENOUS
  Administered 2017-02-23 (×2): 2 mg via INTRAVENOUS
  Administered 2017-02-24 (×3): 1 mg via INTRAVENOUS
  Administered 2017-02-24: 2 mg via INTRAVENOUS
  Administered 2017-02-24 – 2017-02-25 (×4): 1 mg via INTRAVENOUS
  Filled 2017-02-23: qty 2
  Filled 2017-02-23 (×8): qty 1
  Filled 2017-02-23 (×2): qty 2

## 2017-02-23 MED ORDER — 0.9 % SODIUM CHLORIDE (POUR BTL) OPTIME
TOPICAL | Status: DC | PRN
  Administered 2017-02-23: 1000 mL

## 2017-02-23 MED ORDER — ESCITALOPRAM OXALATE 20 MG PO TABS
20.0000 mg | ORAL_TABLET | Freq: Every day | ORAL | Status: DC
  Administered 2017-02-23 – 2017-02-25 (×3): 20 mg via ORAL
  Filled 2017-02-23 (×3): qty 1

## 2017-02-23 MED ORDER — SUGAMMADEX SODIUM 200 MG/2ML IV SOLN
INTRAVENOUS | Status: DC | PRN
  Administered 2017-02-23: 200 mg via INTRAVENOUS

## 2017-02-23 MED ORDER — GABAPENTIN 300 MG PO CAPS
300.0000 mg | ORAL_CAPSULE | Freq: Every day | ORAL | Status: AC
  Administered 2017-02-23 – 2017-02-25 (×3): 300 mg via ORAL
  Filled 2017-02-23 (×3): qty 1

## 2017-02-23 MED ORDER — ROCURONIUM BROMIDE 10 MG/ML (PF) SYRINGE
PREFILLED_SYRINGE | INTRAVENOUS | Status: DC | PRN
  Administered 2017-02-23: 20 mg via INTRAVENOUS
  Administered 2017-02-23: 50 mg via INTRAVENOUS
  Administered 2017-02-23: 10 mg via INTRAVENOUS
  Administered 2017-02-23: 5 mg via INTRAVENOUS

## 2017-02-23 MED ORDER — DIPHENHYDRAMINE HCL 50 MG/ML IJ SOLN
12.5000 mg | Freq: Four times a day (QID) | INTRAMUSCULAR | Status: DC | PRN
  Administered 2017-02-25: 12.5 mg via INTRAVENOUS
  Filled 2017-02-23: qty 1

## 2017-02-23 MED ORDER — HYDROCORTISONE 1 % EX CREA
1.0000 "application " | TOPICAL_CREAM | Freq: Three times a day (TID) | CUTANEOUS | Status: DC | PRN
  Administered 2017-02-26: 1 via TOPICAL

## 2017-02-23 MED ORDER — DEXAMETHASONE SODIUM PHOSPHATE 10 MG/ML IJ SOLN
INTRAMUSCULAR | Status: DC | PRN
  Administered 2017-02-23: 10 mg via INTRAVENOUS

## 2017-02-23 MED ORDER — PANTOPRAZOLE SODIUM 40 MG IV SOLR: 40.0000 mg | Freq: Two times a day (BID) | INTRAVENOUS | Status: DC

## 2017-02-23 MED ORDER — LACTATED RINGERS IV SOLN
INTRAVENOUS | Status: DC | PRN
  Administered 2017-02-23 (×2): via INTRAVENOUS

## 2017-02-23 MED ORDER — DIPHENHYDRAMINE HCL 25 MG PO CAPS
25.0000 mg | ORAL_CAPSULE | Freq: Four times a day (QID) | ORAL | Status: DC | PRN
  Filled 2017-02-23: qty 1

## 2017-02-23 MED ORDER — OXYCODONE HCL 5 MG PO TABS
5.0000 mg | ORAL_TABLET | ORAL | Status: DC | PRN
  Administered 2017-02-23 – 2017-02-24 (×2): 5 mg via ORAL
  Administered 2017-02-25 (×3): 10 mg via ORAL
  Administered 2017-02-26: 5 mg via ORAL
  Filled 2017-02-23 (×2): qty 1
  Filled 2017-02-23 (×2): qty 2
  Filled 2017-02-23: qty 1
  Filled 2017-02-23: qty 2

## 2017-02-23 MED ORDER — BISACODYL 10 MG RE SUPP
10.0000 mg | Freq: Two times a day (BID) | RECTAL | Status: DC | PRN
Start: 2017-02-23 — End: 2017-02-26

## 2017-02-23 MED ORDER — SUGAMMADEX SODIUM 200 MG/2ML IV SOLN
INTRAVENOUS | Status: AC
  Filled 2017-02-23: qty 2

## 2017-02-23 MED ORDER — LORAZEPAM 2 MG/ML IJ SOLN: 0.5000 mg | Freq: Three times a day (TID) | INTRAMUSCULAR | Status: DC | PRN

## 2017-02-23 MED ORDER — PANTOPRAZOLE SODIUM 40 MG PO TBEC
40.0000 mg | DELAYED_RELEASE_TABLET | Freq: Every day | ORAL | Status: DC
  Administered 2017-02-24 – 2017-02-26 (×3): 40 mg via ORAL
  Filled 2017-02-23 (×3): qty 1

## 2017-02-23 MED ORDER — PROPOFOL 10 MG/ML IV BOLUS
INTRAVENOUS | Status: AC
Start: 2017-02-23 — End: 2017-02-23
  Filled 2017-02-23: qty 20

## 2017-02-23 MED ORDER — GENTAMICIN SULFATE 40 MG/ML IJ SOLN
5.0000 mg/kg | INTRAVENOUS | Status: AC
  Administered 2017-02-23: 360 mg via INTRAVENOUS
  Filled 2017-02-23: qty 9

## 2017-02-23 MED ORDER — CLINDAMYCIN PHOSPHATE 900 MG/50ML IV SOLN: 900.0000 mg | INTRAVENOUS | Status: AC

## 2017-02-23 MED ORDER — METOCLOPRAMIDE HCL 5 MG/ML IJ SOLN
5.0000 mg | Freq: Four times a day (QID) | INTRAMUSCULAR | Status: DC | PRN
  Administered 2017-02-24: 5 mg via INTRAVENOUS
  Filled 2017-02-23: qty 2

## 2017-02-23 MED ORDER — ONDANSETRON HCL 4 MG/2ML IJ SOLN
4.0000 mg | Freq: Four times a day (QID) | INTRAMUSCULAR | Status: DC | PRN
  Administered 2017-02-23 – 2017-02-26 (×4): 4 mg via INTRAVENOUS
  Filled 2017-02-23 (×4): qty 2

## 2017-02-23 MED ORDER — PROCHLORPERAZINE EDISYLATE 5 MG/ML IJ SOLN
5.0000 mg | INTRAMUSCULAR | Status: DC | PRN
  Administered 2017-02-23: 10 mg via INTRAVENOUS
  Filled 2017-02-23: qty 2

## 2017-02-23 MED ORDER — PROMETHAZINE HCL 25 MG/ML IJ SOLN
INTRAMUSCULAR | Status: AC
  Filled 2017-02-23: qty 1

## 2017-02-23 MED ORDER — FENTANYL CITRATE (PF) 100 MCG/2ML IJ SOLN
INTRAMUSCULAR | Status: DC | PRN
  Administered 2017-02-23: 100 ug via INTRAVENOUS
  Administered 2017-02-23 (×2): 50 ug via INTRAVENOUS

## 2017-02-23 MED ORDER — GUAIFENESIN-DM 100-10 MG/5ML PO SYRP: 10.0000 mL | ORAL_SOLUTION | ORAL | Status: DC | PRN

## 2017-02-23 MED ORDER — CHLORHEXIDINE GLUCONATE CLOTH 2 % EX PADS: 6.0000 | MEDICATED_PAD | Freq: Once | CUTANEOUS | Status: DC

## 2017-02-23 MED ORDER — ESMOLOL HCL 100 MG/10ML IV SOLN
INTRAVENOUS | Status: DC | PRN
  Administered 2017-02-23: 10 mg via INTRAVENOUS

## 2017-02-23 MED ORDER — ACETAMINOPHEN 500 MG PO TABS
1000.0000 mg | ORAL_TABLET | ORAL | Status: AC
  Administered 2017-02-23: 1000 mg via ORAL
  Filled 2017-02-23: qty 2

## 2017-02-23 MED ORDER — SODIUM CHLORIDE 0.9 % IV SOLN
8.0000 mg | Freq: Four times a day (QID) | INTRAVENOUS | Status: DC | PRN
  Filled 2017-02-23: qty 4

## 2017-02-23 MED ORDER — BARIATRIC MULTIVITAMINS/IRON PO CAPS: 1.0000 | ORAL_CAPSULE | Freq: Every day | ORAL | Status: DC

## 2017-02-23 MED ORDER — MIDAZOLAM HCL 5 MG/5ML IJ SOLN
INTRAMUSCULAR | Status: DC | PRN
  Administered 2017-02-23: 2 mg via INTRAVENOUS

## 2017-02-23 MED ORDER — STERILE WATER FOR IRRIGATION IR SOLN
Status: DC | PRN
  Administered 2017-02-23: 1000 mL

## 2017-02-23 MED ORDER — LACTATED RINGERS IV BOLUS (SEPSIS): 1000.0000 mL | Freq: Three times a day (TID) | INTRAVENOUS | Status: AC | PRN

## 2017-02-23 SURGICAL SUPPLY — 45 items
APPLIER CLIP 5 13 M/L LIGAMAX5 (MISCELLANEOUS) ×4
CABLE HIGH FREQUENCY MONO STRZ (ELECTRODE) ×4 IMPLANT
CHLORAPREP W/TINT 26ML (MISCELLANEOUS) ×4 IMPLANT
CLIP APPLIE 5 13 M/L LIGAMAX5 (MISCELLANEOUS) ×2 IMPLANT
COVER MAYO STAND STRL (DRAPES) ×4 IMPLANT
COVER SURGICAL LIGHT HANDLE (MISCELLANEOUS) ×4 IMPLANT
DECANTER SPIKE VIAL GLASS SM (MISCELLANEOUS) ×4 IMPLANT
DRAIN CHANNEL 19F RND (DRAIN) IMPLANT
DRAPE C-ARM 42X120 X-RAY (DRAPES) ×4 IMPLANT
DRAPE WARM FLUID 44X44 (DRAPE) ×4 IMPLANT
DRSG TEGADERM 2-3/8X2-3/4 SM (GAUZE/BANDAGES/DRESSINGS) ×4 IMPLANT
DRSG TEGADERM 4X4.75 (GAUZE/BANDAGES/DRESSINGS) ×20 IMPLANT
ELECT REM PT RETURN 15FT ADLT (MISCELLANEOUS) ×4 IMPLANT
ENDOLOOP SUT PDS II  0 18 (SUTURE) ×2
ENDOLOOP SUT PDS II 0 18 (SUTURE) ×2 IMPLANT
EVACUATOR SILICONE 100CC (DRAIN) IMPLANT
GAUZE SPONGE 2X2 8PLY STRL LF (GAUZE/BANDAGES/DRESSINGS) ×2 IMPLANT
GLOVE BIOGEL PI IND STRL 7.5 (GLOVE) ×12 IMPLANT
GLOVE BIOGEL PI IND STRL 8 (GLOVE) ×2 IMPLANT
GLOVE BIOGEL PI INDICATOR 7.5 (GLOVE) ×12
GLOVE BIOGEL PI INDICATOR 8 (GLOVE) ×2
GLOVE ECLIPSE 8.0 STRL XLNG CF (GLOVE) ×4 IMPLANT
GLOVE INDICATOR 8.0 STRL GRN (GLOVE) ×4 IMPLANT
GLOVE SURG SS PI 8.0 STRL IVOR (GLOVE) ×4 IMPLANT
GOWN STRL REUS W/TWL XL LVL3 (GOWN DISPOSABLE) ×16 IMPLANT
IRRIG SUCT STRYKERFLOW 2 WTIP (MISCELLANEOUS) ×4
IRRIGATION SUCT STRKRFLW 2 WTP (MISCELLANEOUS) ×2 IMPLANT
KIT BASIN OR (CUSTOM PROCEDURE TRAY) ×4 IMPLANT
PAD POSITIONING PINK XL (MISCELLANEOUS) ×4 IMPLANT
POSITIONER SURGICAL ARM (MISCELLANEOUS) IMPLANT
SCISSORS LAP 5X35 DISP (ENDOMECHANICALS) ×4 IMPLANT
SET CHOLANGIOGRAPH MIX (MISCELLANEOUS) ×4 IMPLANT
SHEARS HARMONIC ACE PLUS 36CM (ENDOMECHANICALS) ×4 IMPLANT
SPONGE GAUZE 2X2 STER 10/PKG (GAUZE/BANDAGES/DRESSINGS) ×2
SUT MNCRL AB 4-0 PS2 18 (SUTURE) ×4 IMPLANT
SUT PDS AB 1 CT1 27 (SUTURE) ×12 IMPLANT
SYR 20CC LL (SYRINGE) ×4 IMPLANT
TOWEL OR 17X26 10 PK STRL BLUE (TOWEL DISPOSABLE) ×4 IMPLANT
TOWEL OR NON WOVEN STRL DISP B (DISPOSABLE) ×4 IMPLANT
TRAY LAPAROSCOPIC (CUSTOM PROCEDURE TRAY) ×4 IMPLANT
TROCAR BLADELESS OPT 5 100 (ENDOMECHANICALS) ×16 IMPLANT
TROCAR BLADELESS OPT 5 150 (ENDOMECHANICALS) ×4 IMPLANT
TUBING CONNECTING 10 (TUBING) ×6 IMPLANT
TUBING CONNECTING 10' (TUBING) ×2
TUBING INSUF HEATED (TUBING) ×4 IMPLANT

## 2017-02-23 NOTE — Op Note (Signed)
02/23/2017  PATIENT:  Mary Williams  42 y.o. female  Patient Care Team: Darrol Jump, PA-C as PCP - General (Family Medicine) Excell Seltzer, MD as Consulting Physician (General Surgery)  PRE-OPERATIVE DIAGNOSIS:    Right upper quadrant severe abdominal pain.  Focal peritonitis with possible perforation/abscess/volvulus.  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 Laparoscopic cholecystectomy  SURGEON:  Adin Hector, MD, FACS.  ASSISTANT: OR Staff   ANESTHESIA:    General with endotracheal intubation Local anesthetic as a field block  EBL:  (See Anesthesia Intraoperative Record) Total I/O In: 1000 [I.V.:1000] Out: 150 [Urine:50; Blood:100]   Delay start of Pharmacological VTE agent (>24hrs) due to surgical blood loss or risk of bleeding:  no  DRAINS: None   SPECIMEN: Gallbladder    DISPOSITION OF SPECIMEN:  PATHOLOGY  COUNTS:  YES  PLAN OF CARE: Admit to inpatient   PATIENT DISPOSITION:  PACU - hemodynamically stable.  INDICATION: Middle-aged woman status post laparoscopic gastric sleeve resection with worsening heartburn and reflux.  Converted to a Roux-en-Y gastric bypass 7 weeks ago.  Initially did well but then had severe abdominal pain right upper quadrant postprandial a few days ago.  Recurrent.  Concerning.  Admitted.  Ultrasound negative for cholecystitis or stones.  CT scan negative for internal hernia nor perforation.  HIDA scan showed no evidence of complete cystic duct obstruction.  However, the patient had severe reproduction of abdominal pain and nausea and retching with oral Ensure p.o. challenge test.  Gallbladder fraction ejection fraction low end of normal.  Patient's abdominal pain worse today.  Not well controlled.  Exam was concerning for peritonitis.  Chest x-ray and EKG were reassuring against other  etiologies.  Given the patient's decline, I recommended laparoscopic exploration.  Possible cholecystectomy.  Possible need for repair of perforation.  The anatomy & physiology of the digestive tract was discussed.  The pathophysiology of perforation was discussed.  Differential diagnosis such as perforated ulcer or colon, etc was discussed.   Natural history risks without surgery such as death was discussed.  I recommended abdominal exploration to diagnose & treat the source of the problem.  Laparoscopic & open techniques were discussed.   Risks such as bleeding, infection, abscess, leak, reoperation, bowel resection, possible ostomy, injury to other organs, need for repair of tissues / organs, hernia, heart attack, death, and other risks were discussed.   The risks of no intervention will lead to serious problems including death.   I expressed a good likelihood that surgery will address the problem.    Goals of post-operative recovery were discussed as well.  We will work to minimize complications although risks in an emergent setting are high.   Questions were answered.  The patient expressed understanding & wishes to proceed with surgery.      OR FINDINGS: No evidence of peritonitis.  No purulence.  No bleeding.  No ascites.  Standard Roux-en-Y gastric bypass anatomy with antecolic gastrojejunal limb.  Moderately long candy-cane of jejunum proximal to the gastrojejunostomy on lateral/splenic side nondistended.  Jejunojejunostomy intact without any dilation.  No internal hernia.  No interloop adhesions or bowel obstruction.  No Meckel's.  No jejunal diverticula.  Floppy cecal bascule but no cecal volvulus.  Prior appendectomy.  Ascending, transverse, and descending colon underwhelming.  Moderately tortuous and redundant sigmoid colon with adhesions to anterior pelvis consistent with prior hysterectomy.    Moderately dilated  but no evidence of any transition point or volvulus mass or stricture.  No  diverticulitis.  Mildly enlarged ovaries with bilateral small ovarian cysts with a chocolate cyst on the right.  However no obvious cyst, cancer, or tumor.  No tubo-ovarian abscess.  Endoscopy reveals no stricturing at the viable gastrojejunal anastomosis.  No ulceration.  No leak.  No perforation.  Gallbladder very dilated & boggy with chronic adhesions suspicious for chronic acalculous cholecystitis.  Some mild inflammation suspicious for early acute cholecystitis.  Liver: Some fatty change on the liver but floppy and mobile.  Not consistent with cirrhosis.  DESCRIPTION:   Informed consent was confirmed.  The patient underwent general anaesthesia without difficulty.  The patient was positioned appropriately.  VTE prevention in place.  The patient's abdomen was clipped, prepped, & draped in a sterile fashion.  Surgical timeout confirmed our plan.  Peritoneal entry with a laparoscopic port was obtained using optical entry technique in the left upper abdomen as the patient was positioned in reverse Trendelenburg.  Entry was clean.  I induced carbon dioxide insufflation.  Camera inspection revealed no injury.  Extra ports were carefully placed under direct laparoscopic visualization.  I inspected the abdominal cavity.  No evidence of any perforation or peritonitis.  Findings as noted above.  Gallbladder did look enlarged and distended.  No gangrene.  Proceeded with diagnostic laparoscopy.  Sigmoid colon dilated but viable.  I found the ileocecal region.  The cecum and proximal ascending colon was unattached and quite floppy c/w a cecal bascule.  However there is no cecal volvulus.  I ran the small bowel proximally until we came to the jejunojejunostomy in the left mid abdomen.  Healthy & not dilated.  Mild adhesions to the region but close to the internal mesenteric closure areas.  No obstruction.  I ran the small bowel towards the gastrojejunostomy and followed that antecolic limb over the transverse  colon.  Gastrojejunostomy with long candy-cane proximal jejunal and going towards the left side but no evidence of any perforation or peritonitis.  I ran the biliopancreatic limb from the jejunojejunostomy to the ligament of Treitz.  Viable & intact.  I saw no mesenteric hernias..  No internal hernia.  No closed loop bowel obstruction.  No twisting or abnormalities.  Minimal interloop small bowel adhesions  Inspection of the colon was rather underwhelming, however the sigmoid colon did seem to be rather redundant and stretched out, folded over itself.  I did mobilize it in the lateral to medial fashion off the anterior pelvis.  I mobilized it off the left adnexa.  Confirmed there was no mass or tumor.  No diverticulitis, inflammation, abscess, nor perforation.  No volvulus or torsion.  I clamped off the gastrojejunal limb 30 cm distal to the gastrojejunal anastomosis.  Placed irrigation of left upper quadrant and patient in Trendelenburg positioning.   I proceeded to do upper endoscopy transorally.  Came down a normal esophagus.  Came across a normal stapled gastrojejunal anastomosis without any stricturing.  Nice small pouch.  No ulceration.  No bleeding.  Mucosa viable.  I insufflated the gastrojejunal anastomosis.  No air leak with insufflation and irrigation.  No evidence of any abnormalities at the gastrojejunostomy.  Reassuring.  Endoluminal air aspirated and scope removed.  In the absence of any other abnormality & with the gallbladder looking suspicious, I proceeded with cholecystectomy.  She had rather classic biliary colic symptoms and Percell Miller sign that seem more suspicious for acalculus cholecystitis.  I turned attention to  the right upper quadrant.  The gallbladder fundus was elevated cephalad.   I used hook cautery to free adhesions of greater omentum and mesocolon off the ventral surface of the gallbladder.  Suspicious for chronic inflammation in the past.  I carefully freed the peritoneal  coverings between the gallbladder and the liver on the posteriolateral and anteriomedial walls.   I used careful blunt and hook dissection to help get a good critical view of the cystic artery and cystic duct. I did further dissection to free two thirds of the gallbladder off the liver bed to get a good critical view of the infundibulum and cystic duct.I dissected out the cystic artery; and, after getting a good 360 view, ligated the anterior & posterior branches of the cystic artery close on the infundibulum using a titanium clip applier.  I came down to the infundibulum and cystic duct.  I skeletonized the skin cystic duct about 3 cm.  It was mildly inflamed and dilated.  I was skeptical the clips would hold very well.  I therefore proceeded to free the gallbladder off its last few attachments on the liver bed.  Because she had no stones nor any markedly elevated liver function tests, I held off on doing cholangiography.  I ligated the cystic duct with a 0 PDS Endoloop close to the cystic/common bile duct junction.  I then placed clips more distally towards the infundibulum.  I transected the gallbladder off the cystic duct.   I reinspected the entire abdomen.  I ensured hemostasis on the gallbladder fossa of the liver and sigmoid mesentery with focused cautery.  I inspected the rest of the abdomen & detected no injury nor bleeding elsewhere.   I ran the small bowel again to prove no twisting or torturing.  Allowed the cecum to flop down towards the pelvis.  I confirmed that gastrojejunostomy and jejunojejunostomy were viable and in natural positioning.   I removed the gallbladder out a left upper quadrant 10 mm port.  Felt no stones.  I closed the fascia of this 10 mm port transversely using #1 PDS interrupted stitch.  The rest were 5 mm ports so I do not need to close the fascia there.  I closed the skin using 4-0 monocryl stitch.  Sterile dressings were applied. The patient was extubated & arrived in the  PACU in stable condition.  Her pain is already less.  I made an attempt to locate family to discuss patient's status and recommendations.  No one is available at this time.  I will try again later   Adin Hector, M.D., F.A.C.S. Gastrointestinal and Minimally Invasive Surgery [facility] [address], [place], [place] [address] [phone] Main / Paging  02/23/2017 2:00 PM

## 2017-02-23 NOTE — Discharge Instructions (Signed)
LAPAROSCOPIC SURGERY: POST OP INSTRUCTIONS ° °###################################################################### ° °EAT °Gradually transition to a high fiber diet with a fiber supplement over the next few weeks after discharge.  Start with a pureed / full liquid diet (see below) ° °WALK °Walk an hour a day.  Control your pain to do that.   ° °CONTROL PAIN °Control pain so that you can walk, sleep, tolerate sneezing/coughing, go up/down stairs. ° °HAVE A BOWEL MOVEMENT DAILY °Keep your bowels regular to avoid problems.  OK to try a laxative to override constipation.  OK to use an antidairrheal to slow down diarrhea.  Call if not better after 2 tries ° °CALL IF YOU HAVE PROBLEMS/CONCERNS °Call if you are still struggling despite following these instructions. °Call if you have concerns not answered by these instructions ° °###################################################################### ° ° ° °1. DIET: Follow a light bland diet the first 24 hours after arrival home, such as soup, liquids, crackers, etc.  Be sure to include lots of fluids daily.  Avoid fast food or heavy meals as your are more likely to get nauseated.  Eat a low fat the next few days after surgery.   °2. Take your usually prescribed home medications unless otherwise directed. °3. PAIN CONTROL: °a. Pain is best controlled by a usual combination of three different methods TOGETHER: °i. Ice/Heat °ii. Over the counter pain medication °iii. Prescription pain medication °b. Most patients will experience some swelling and bruising around the incisions.  Ice packs or heating pads (30-60 minutes up to 6 times a day) will help. Use ice for the first few days to help decrease swelling and bruising, then switch to heat to help relax tight/sore spots and speed recovery.  Some people prefer to use ice alone, heat alone, alternating between ice & heat.  Experiment to what works for you.  Swelling and bruising can take several weeks to resolve.   °c. It is  helpful to take an over-the-counter pain medication regularly for the first few weeks.  Choose one of the following that works best for you: °i. Naproxen (Aleve, etc)  Two 220mg tabs twice a day °ii. Ibuprofen (Advil, etc) Three 200mg tabs four times a day (every meal & bedtime) °iii. Acetaminophen (Tylenol, etc) 500-650mg four times a day (every meal & bedtime) °d. A  prescription for pain medication (such as oxycodone, hydrocodone, etc) should be given to you upon discharge.  Take your pain medication as prescribed.  °i. If you are having problems/concerns with the prescription medicine (does not control pain, nausea, vomiting, rash, itching, etc), please call us [phone] to see if we need to switch you to a different pain medicine that will work better for you and/or control your side effect better. °ii. If you need a refill on your pain medication, please contact your pharmacy.  They will contact our office to request authorization. Prescriptions will not be filled after 5 pm or on week-ends. °4. Avoid getting constipated.  Between the surgery and the pain medications, it is common to experience some constipation.  Increasing fluid intake and taking a fiber supplement (such as Metamucil, Citrucel, FiberCon, MiraLax, etc) 1-2 times a day regularly will usually help prevent this problem from occurring.  A mild laxative (prune juice, Milk of Magnesia, MiraLax, etc) should be taken according to package directions if there are no bowel movements after 48 hours.   °5. Watch out for diarrhea.  If you have many loose bowel movements, simplify your diet to bland foods & liquids for   a few days.  Stop any stool softeners and decrease your fiber supplement.  Switching to mild anti-diarrheal medications (Kayopectate, Pepto Bismol) can help.  If this worsens or does not improve, please call us. °6. Wash / shower every day.  You may shower over the dressings as they are waterproof.  Continue to shower over incision(s)  after the dressing is off. °7. Remove your waterproof bandages 5 days after surgery.  You may leave the incision open to air.  You may replace a dressing/Band-Aid to cover the incision for comfort if you wish.  °8. ACTIVITIES as tolerated:   °a. You may resume regular (light) daily activities beginning the next day--such as daily self-care, walking, climbing stairs--gradually increasing activities as tolerated.  If you can walk 30 minutes without difficulty, it is safe to try more intense activity such as jogging, treadmill, bicycling, low-impact aerobics, swimming, etc. °b. Save the most intensive and strenuous activity for last such as sit-ups, heavy lifting, contact sports, etc  Refrain from any heavy lifting or straining until you are off narcotics for pain control.   °c. DO NOT PUSH THROUGH PAIN.  Let pain be your guide: If it hurts to do something, don't do it.  Pain is your body warning you to avoid that activity for another week until the pain goes down. °d. You may drive when you are no longer taking prescription pain medication, you can comfortably wear a seatbelt, and you can safely maneuver your car and apply brakes. °e. You may have sexual intercourse when it is comfortable.  °9. FOLLOW UP in our office °a. Please call [facility] at [phone] to set up an appointment to see your surgeon in the office for a follow-up appointment approximately 2-3 weeks after your surgery. °b. Make sure that you call for this appointment the day you arrive home to insure a convenient appointment time. °10. IF YOU HAVE DISABILITY OR FAMILY LEAVE FORMS, BRING THEM TO THE OFFICE FOR PROCESSING.  DO NOT GIVE THEM TO YOUR DOCTOR. ° ° °WHEN TO CALL US [phone]: °1. Poor pain control °2. Reactions / problems with new medications (rash/itching, nausea, etc)  °3. Fever over 101.5 F (38.5 C) °4. Inability to urinate °5. Nausea and/or vomiting °6. Worsening swelling or bruising °7. Continued bleeding from incision. °8. Increased  pain, redness, or drainage from the incision ° ° The clinic staff is available to answer your questions during regular business hours (8:30am-5pm).  Please don’t hesitate to call and ask to speak to one of our nurses for clinical concerns.  ° If you have a medical emergency, go to the nearest emergency room or call 911. ° A surgeon from [facility] is always on call at the hospitals ° ° °[facility] °[address], [address], [place], [place]  [address] ? °MAIN: [phone] ? TOLL FREE: [phone] ?  °FAX [phone] °[email or web address] ° ° °

## 2017-02-23 NOTE — Anesthesia Preprocedure Evaluation (Signed)
Anesthesia Evaluation  Patient identified by MRN, date of birth, ID band Patient awake    Reviewed: Allergy & Precautions, NPO status , Patient's Chart, lab work & pertinent test results  Airway Mallampati: I  TM Distance: >3 FB Neck ROM: Full    Dental no notable dental hx.    Pulmonary neg pulmonary ROS,    breath sounds clear to auscultation       Cardiovascular negative cardio ROS   Rhythm:Regular Rate:Normal     Neuro/Psych Anxiety negative neurological ROS     GI/Hepatic hiatal hernia, GERD  ,  Endo/Other  negative endocrine ROSMorbid obesity  Renal/GU negative Renal ROS     Musculoskeletal negative musculoskeletal ROS (+)   Abdominal   Peds  Hematology negative hematology ROS (+)   Anesthesia Other Findings   Reproductive/Obstetrics                             Anesthesia Physical  Anesthesia Plan  ASA: III  Anesthesia Plan: General   Post-op Pain Management:    Induction: Intravenous  PONV Risk Score and Plan: 3 and Ondansetron, Dexamethasone, Midazolam and Treatment may vary due to age or medical condition  Airway Management Planned: Oral ETT  Additional Equipment:   Intra-op Plan:   Post-operative Plan: Extubation in OR  Informed Consent: I have reviewed the patients History and Physical, chart, labs and discussed the procedure including the risks, benefits and alternatives for the proposed anesthesia with the patient or authorized representative who has indicated his/her understanding and acceptance.     Plan Discussed with: CRNA  Anesthesia Plan Comments:         Anesthesia Quick Evaluation

## 2017-02-23 NOTE — Anesthesia Postprocedure Evaluation (Signed)
Anesthesia Post Note  Patient: Mary Williams  Procedure(s) Performed: DIAGNOSTIC LAPAROSCOPY (N/A Abdomen) LAPAROSCOPIC CHOLECYSTECTOMY (N/A Abdomen) ESOPHAGOGASTRODUODENOSCOPY (EGD) (N/A )     Patient location during evaluation: PACU Anesthesia Type: General Level of consciousness: awake and alert Pain management: pain level controlled Vital Signs Assessment: post-procedure vital signs reviewed and stable Respiratory status: spontaneous breathing, nonlabored ventilation, respiratory function stable and patient connected to nasal cannula oxygen Cardiovascular status: blood pressure returned to baseline and stable Postop Assessment: no apparent nausea or vomiting Anesthetic complications: no    Last Vitals:  Vitals:   02/23/17 1700 02/23/17 1809  BP: 114/61 117/71  Pulse: 74 77  Resp: 14 16  Temp: 36.9 C 37.1 C  SpO2: 97% 99%    Last Pain:  Vitals:   02/23/17 1836  TempSrc:   PainSc: [place]

## 2017-02-23 NOTE — Anesthesia Procedure Notes (Signed)
Procedure Name: Intubation Date/Time: 02/23/2017 12:24 PM Performed by: Lavina Hamman, CRNA Pre-anesthesia Checklist: Patient identified, Emergency Drugs available, Suction available, Patient being monitored and Timeout performed Patient Re-evaluated:Patient Re-evaluated prior to induction Oxygen Delivery Method: Circle system utilized Preoxygenation: Pre-oxygenation with 100% oxygen Induction Type: IV induction Ventilation: Mask ventilation without difficulty Laryngoscope Size: Mac and 4 Grade View: Grade I Tube type: Oral Tube size: 7.0 mm Number of attempts: 1 Airway Equipment and Method: Stylet Placement Confirmation: ETT inserted through vocal cords under direct vision,  positive ETCO2,  CO2 detector and breath sounds checked- equal and bilateral Secured at: 21 cm Tube secured with: Tape Dental Injury: Teeth and Oropharynx as per pre-operative assessment

## 2017-02-23 NOTE — Transfer of Care (Signed)
Immediate Anesthesia Transfer of Care Note  Patient: Mary Williams  Procedure(s) Performed: DIAGNOSTIC LAPAROSCOPY (N/A Abdomen) LAPAROSCOPIC CHOLECYSTECTOMY (N/A Abdomen) ESOPHAGOGASTRODUODENOSCOPY (EGD) (N/A )  Patient Location: PACU  Anesthesia Type:General  Level of Consciousness: awake, alert  and oriented  Airway & Oxygen Therapy: Patient Spontanous Breathing and Patient connected to face mask oxygen  Post-op Assessment: Report given to RN  Post vital signs: Reviewed and stable  Last Vitals:  Vitals:   02/22/17 2123 02/23/17 0506  BP: 122/68 125/79  Pulse: (!) 56 64  Resp: 15 16  Temp: 36.8 C 36.8 C  SpO2: 100% 100%    Last Pain:  Vitals:   02/23/17 1129  TempSrc:   PainSc: 8       Patients Stated Pain Goal: 3 (35/59/74 1638)  Complications: No apparent anesthesia complications

## 2017-02-23 NOTE — Progress Notes (Signed)
[facility]  [address] [place], [place] [address] Phone: [phone]  FAX: [phone]      Mary Williams [id number] Oct 22, 1975  CARE TEAM:  PCP: Darrol Jump, PA-C  Outpatient Care Team: Patient Care Team: Darrol Jump, PA-C as PCP - General (Family Medicine) Excell Seltzer, MD as Consulting Physician (General Surgery)  Inpatient Treatment Team: Treatment Team: Attending Provider: Excell Seltzer, MD; Attending Physician: Excell Seltzer, MD; Technician: Leda Quail, NT; Registered Nurse: Celedonio Savage, RN   Problem List:   Principal Problem:   Right upper quadrant abdominal pain Active Problems:   Morbid obesity with BMI of 50.0-59.9, adult ([place])   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  Story classic for biliary colic and cholecystitis with reproduction of biliary colic on HIDA scan but otherwise normal evaluation.  Pain worse than expected for cholecystitis.  Plan:  Diagnostic laparoscopy.  Possible cholecystectomy.  This could be a calculus cholecystitis that is now constant.  She notes after the HIDA scan she felt horrible nausea and vomiting and her pain worsened.  Even ice chips hurt now.  Pain is really in the right upper quadrant and right flank.  Denies any heartburn or reflux.  Does not hurt anywhere else in the abdomen.  Perhaps this is just severe cholecystitis.  Would be a good idea to make sure there is no torsion or perforation or ischemia or other.  May be consider upper endoscopy to rule out leak or irritation.  CAT scan does not look highly suspicious from yesterday but her exam is worse.    The anatomy & physiology of the digestive tract was discussed.  The pathophysiology of perforation was discussed.  Differential diagnosis such as  perforated ulcer or colon, etc was discussed.   Natural history risks without surgery such as death was discussed.  I recommended abdominal exploration to diagnose & treat the source of the problem.  Laparoscopic & open techniques were discussed.   Risks such as bleeding, infection, abscess, leak, reoperation, bowel resection, possible ostomy, injury to other organs, need for repair of tissues / organs, hernia, heart attack, death, and other risks were discussed.   The risks of no intervention will lead to serious problems including death.   I expressed a good likelihood that surgery will address the problem.    Goals of post-operative recovery were discussed as well.  We will work to minimize complications although risks in an emergent setting are high.   Questions were answered.  The patient expressed understanding & wishes to proceed with surgery.        -Chest x-ray. -EKG -VTE prophylaxis- SCDs, etc -mobilize as tolerated to help recovery  30 minutes spent in review, evaluation, examination, counseling, and coordination of care.  More than 50% of that time was spent in counseling.  Adin Hector, M.D., F.A.C.S. Gastrointestinal and Minimally Invasive Surgery [facility] [address], [place], [place] [address] [phone] Main / Paging   02/23/2017    Subjective: (Chief complaint)  Feels worse.    Constant severe pain.  Right upper quadrant only  Felt worse after drinking Ensure with HIDA scan.  Hurts on the right upper side and right flank.  Cannot tolerate even sips or ice chips.  That seems to make it worse.  No cough.  No pressure.  No heartburn or reflux.  No dysphagia.    Objective:  Vital signs:  Vitals:   02/22/17 0954 02/22/17 1722 02/22/17 2123 02/23/17 0506  BP:  126/62 122/68 125/79  Pulse:  71 (!) 56 64  Resp:  20 15 16   Temp:  98.1 F (36.7 C) 98.2 F (36.8 C) 98.2 F (36.8 C)  TempSrc:  Oral  Oral  SpO2:  100%  100% 100%  Weight: 97.3 kg (214 lb 8.1 oz)     Height: 5' 4"  (1.626 m)       Last BM Date: 02/21/17  Intake/Output   Yesterday:  12/31 0701 - 01/01 0700 In: 1816.7 [P.O.:120; I.V.:1696.7] Out: 1100 [Urine:1100] This shift:  No intake/output data recorded.  Bowel function:  Flatus: YES  BM:  No  Drain: (No drain)   Physical Exam:  General: Pt awake/alert/oriented x4 in severe acute distress.  Tearful.  "Make it go away!" Eyes: PERRL, normal EOM.  Sclera clear.  No icterus Neuro: CN II-XII intact w/o focal sensory/motor deficits. Lymph: No head/neck/groin lymphadenopathy Psych:  No delerium/psychosis/paranoia.  Anxious but consolable. HENT: Normocephalic, Mucus membranes moist.  No thrush Neck: Supple, No tracheal deviation Chest: No chest wall pain w good excursion decreased respiratory effort but lungs clear. CV:  Pulses intact.  Regular rhythm MS: Normal AROM mjr joints.  No obvious deformity  Abdomen: Somewhat firm.  Mildy distended.  Very tender in right upper quadrant and upper flank.  Murphy sign.  Epigastric region not particularly tender.  No pain in left upper quadrant.  Lower abdomen nontender.  Reproduction of pain with cough in bed shake suspicious for focal peritonitis..   No incarcerated hernias.  Ext:  No deformity.  No mjr edema.  No cyanosis Skin: No petechiae / purpura  Results:   Labs: Results for orders placed or performed during the hospital encounter of 02/21/17 (from the past 48 hour(s))  Lipase, blood     Status: None   Collection Time: 02/21/17 11:52 PM  Result Value Ref Range   Lipase 40 11 - 51 U/L  Comprehensive metabolic panel     Status: Abnormal   Collection Time: 02/21/17 11:52 PM  Result Value Ref Range   Sodium 141 135 - 145 mmol/L   Potassium 3.3 (L) 3.5 - 5.1 mmol/L   Chloride 108 101 - 111 mmol/L   CO2 26 22 - 32 mmol/L   Glucose, Bld 89 65 - 99 mg/dL   BUN 8 6 - 20 mg/dL   Creatinine, Ser 0.73 0.44 - 1.00 mg/dL   Calcium  9.1 8.9 - 10.3 mg/dL   Total Protein 7.7 6.5 - 8.1 g/dL   Albumin 4.5 3.5 - 5.0 g/dL   AST 31 15 - 41 U/L   ALT 21 14 - 54 U/L   Alkaline Phosphatase 72 38 - 126 U/L   Total Bilirubin 0.7 0.3 - 1.2 mg/dL   GFR calc non Af Amer >60 >60 mL/min   GFR calc Af Amer >60 >60 mL/min    Comment: (NOTE) The eGFR has been calculated using the CKD EPI equation. This calculation has not been validated in all clinical situations. eGFR's persistently <60 mL/min signify possible Chronic Kidney Disease.    Anion gap 7 5 - 15  CBC     Status: Abnormal   Collection Time: 02/21/17 11:52 PM  Result Value Ref Range   WBC 12.2 (H) 4.0 - 10.5 K/uL   RBC 4.84 3.87 - 5.11  MIL/uL   Hemoglobin 14.2 12.0 - 15.0 g/dL   HCT 42.2 36.0 - 46.0 %   MCV 87.2 78.0 - 100.0 fL   MCH 29.3 26.0 - 34.0 pg   MCHC 33.6 30.0 - 36.0 g/dL   RDW 13.5 11.5 - 15.5 %   Platelets 375 150 - 400 K/uL  Urinalysis, Routine w reflex microscopic     Status: Abnormal   Collection Time: 02/21/17 11:52 PM  Result Value Ref Range   Color, Urine YELLOW YELLOW   APPearance CLEAR CLEAR   Specific Gravity, Urine 1.021 1.005 - 1.030   pH 5.0 5.0 - 8.0   Glucose, UA NEGATIVE NEGATIVE mg/dL   Hgb urine dipstick NEGATIVE NEGATIVE   Bilirubin Urine NEGATIVE NEGATIVE   Ketones, ur 5 (A) NEGATIVE mg/dL   Protein, ur NEGATIVE NEGATIVE mg/dL   Nitrite NEGATIVE NEGATIVE   Leukocytes, UA NEGATIVE NEGATIVE  I-Stat beta hCG blood, ED     Status: None   Collection Time: 02/22/17 12:10 AM  Result Value Ref Range   I-stat hCG, quantitative <5.0 <5 mIU/mL   Comment 3            Comment:   GEST. AGE      CONC.  (mIU/mL)   <=1 WEEK        5 - 50     2 WEEKS       50 - 500     3 WEEKS       100 - 10,000     4 WEEKS     1,000 - 30,000        FEMALE AND NON-PREGNANT FEMALE:     LESS THAN 5 mIU/mL   Basic metabolic panel     Status: Abnormal   Collection Time: 02/22/17 10:27 AM  Result Value Ref Range   Sodium 141 135 - 145 mmol/L   Potassium 3.1  (L) 3.5 - 5.1 mmol/L   Chloride 108 101 - 111 mmol/L   CO2 28 22 - 32 mmol/L   Glucose, Bld 85 65 - 99 mg/dL   BUN 6 6 - 20 mg/dL   Creatinine, Ser 0.66 0.44 - 1.00 mg/dL   Calcium 8.6 (L) 8.9 - 10.3 mg/dL   GFR calc non Af Amer >60 >60 mL/min   GFR calc Af Amer >60 >60 mL/min    Comment: (NOTE) The eGFR has been calculated using the CKD EPI equation. This calculation has not been validated in all clinical situations. eGFR's persistently <60 mL/min signify possible Chronic Kidney Disease.    Anion gap 5 5 - 15  CBC     Status: None   Collection Time: 02/22/17 10:27 AM  Result Value Ref Range   WBC 8.1 4.0 - 10.5 K/uL   RBC 4.33 3.87 - 5.11 MIL/uL   Hemoglobin 12.4 12.0 - 15.0 g/dL   HCT 38.0 36.0 - 46.0 %   MCV 87.8 78.0 - 100.0 fL   MCH 28.6 26.0 - 34.0 pg   MCHC 32.6 30.0 - 36.0 g/dL   RDW 13.7 11.5 - 15.5 %   Platelets 292 150 - 400 K/uL    Imaging / Studies: Ct Abdomen Pelvis W Contrast  Result Date: 02/22/2017 CLINICAL DATA:  42 y/o F; acute right upper quadrant abdominal pain. Two months status post gastric bypass. EXAM: CT ABDOMEN AND PELVIS WITH CONTRAST TECHNIQUE: Multidetector CT imaging of the abdomen and pelvis was performed using the standard protocol following bolus administration of intravenous contrast. CONTRAST:  67m  ISOVUE-300 IOPAMIDOL (ISOVUE-300) INJECTION 61%, 136m ISOVUE-300 IOPAMIDOL (ISOVUE-300) INJECTION 61% COMPARISON:  07/26/2016 11/02/2011 CT abdomen and pelvis FINDINGS: Lower chest: Stable 2 mm pulmonary nodule in right middle lobe compatible benign etiology. Hepatobiliary: No focal liver abnormality is seen. No gallstones, gallbladder wall thickening, or biliary dilatation. Pancreas: Unremarkable. No pancreatic ductal dilatation or surrounding inflammatory changes. Spleen: Normal in size without focal abnormality. Adrenals/Urinary Tract: Adrenal glands are unremarkable. Kidneys are normal, without renal calculi, focal lesion, or hydronephrosis.  Bladder is unremarkable. Stomach/Bowel: Status post gastric bypass. Widely patent anastomosis. Appendectomy. No evidence of bowel wall thickening, distention, or inflammatory changes. Vascular/Lymphatic: No significant vascular findings are present. No enlarged abdominal or pelvic lymph nodes. Reproductive: Status post hysterectomy. No adnexal masses. Other: No abdominal wall hernia or abnormality. No abdominopelvic ascites. Musculoskeletal: No acute or significant osseous findings. IMPRESSION: 1. Gastric bypass.  Patent anastomoses.  No inflammatory change. 2. Unremarkable CT of abdomen and pelvis. Electronically Signed   By: LKristine GarbeM.D.   On: 02/22/2017 06:20   Nm Hepato W/eject Fract  Result Date: 02/22/2017 CLINICAL DATA:  Acute RIGHT upper quadrant abdominal pain, 2 months post gastric bypass EXAM: NUCLEAR MEDICINE HEPATOBILIARY IMAGING WITH GALLBLADDER EF TECHNIQUE: Sequential images of the abdomen were obtained out to 60 minutes following intravenous administration of radiopharmaceutical. After oral ingestion of Ensure, gallbladder ejection fraction was determined. At 60 min, normal ejection fraction is greater than 33%. RADIOPHARMACEUTICALS:  5.4 mCi Tc-930mCholetec IV COMPARISON:  None FINDINGS: Normal tracer extraction from bloodstream indicating normal hepatocellular function. Normal excretion of tracer into biliary tree. Gallbladder visualized at 14 min. Small bowel visualized at 17 min. No hepatic retention of tracer. Subjectively normal emptying of tracer from gallbladder following fatty meal stimulation. Calculated gallbladder ejection fraction is 40%, normal. Patient reported no symptoms following Ensure ingestion. Normal gallbladder ejection fraction following Ensure ingestion is greater than 33% at 1 hour. IMPRESSION: Normal exam. Electronically Signed   By: MaLavonia Dana.D.   On: 02/22/2017 15:12   UsKoreabdomen Limited Ruq  Result Date: 02/22/2017 CLINICAL DATA:  Right  upper quadrant pain for 1 day. Bariatric surgery 6 weeks ago. EXAM: ULTRASOUND ABDOMEN LIMITED RIGHT UPPER QUADRANT COMPARISON:  CT abdomen and pelvis 07/26/2016 FINDINGS: Gallbladder: No gallstones or wall thickening visualized. No sonographic Murphy sign noted by sonographer. Common bile duct: Diameter: 4.1 mm, normal Liver: Mild diffuse increased parenchymal echotexture suggesting fatty infiltration. No focal lesions identified. Portal vein is patent on color Doppler imaging with normal direction of blood flow towards the liver. IMPRESSION: No evidence of cholelithiasis or cholecystitis. Mild fatty infiltration of the liver. Electronically Signed   By: WiLucienne Capers.D.   On: 02/22/2017 03:55    Medications / Allergies: per chart  Antibiotics: Anti-infectives (From admission, onward)   Start     Dose/Rate Route Frequency Ordered Stop   02/23/17 1045  clindamycin (CLEOCIN) IVPB 900 mg     900 mg 100 mL/hr over 30 Minutes Intravenous On call to O.R. 02/23/17 1034 02/24/17 0559   02/23/17 1045  gentamicin (GARAMYCIN) 490 mg in dextrose 5 % 100 mL IVPB     5 mg/kg  97.3 kg 112.3 mL/hr over 60 Minutes Intravenous On call to O.R. 02/23/17 1034 02/24/17 0559        Note: Portions of this report may have been transcribed using voice recognition software. Every effort was made to ensure accuracy; however, inadvertent computerized transcription errors may be present.   Any transcriptional errors that result  from this process are unintentional.     Adin Hector, M.D., F.A.C.S. Gastrointestinal and Minimally Invasive Surgery [facility] [address], [place], [place] [address] [phone] Main / Paging   02/23/2017

## 2017-02-24 ENCOUNTER — Encounter (HOSPITAL_COMMUNITY): Payer: Self-pay | Admitting: Surgery

## 2017-02-24 NOTE — Progress Notes (Signed)
Patient ID: Mary Williams, female   DOB: 1975/08/12, 42 y.o.   MRN: [id number] 1 Day Post-Op   Subjective: Still having right upper quadrant abdominal pain but definitely improved from preoperatively.  Some nausea without vomiting.  Objective: Vital signs in last 24 hours: Temp:  [97.9 F (36.6 C)-98.9 F (37.2 C)] 98 F (36.7 C) (01/02 0543) Pulse Rate:  [70-98] 71 (01/02 0543) Resp:  [13-18] 18 (01/02 0543) BP: (110-138)/(47-77) 111/47 (01/02 0543) SpO2:  [92 %-99 %] 97 % (01/02 0543) Last BM Date: 02/21/17  Intake/Output from previous day: 01/01 0701 - 01/02 0700 In: 1878.3 [P.O.:120; I.V.:1758.3] Out: 620 [Urine:520; Blood:100] Intake/Output this shift: Total I/O In: 120 [P.O.:120] Out: -   General appearance: alert, cooperative and mild distress GI: ModerateRight upper quadrant tenderness but improved from preoperative Incision/Wound: Dressings clean and dry  Lab Results:  Recent Labs    02/21/17 2352 02/22/17 1027  WBC 12.2* 8.1  HGB 14.2 12.4  HCT 42.2 38.0  PLT 375 292   BMET Recent Labs    02/21/17 2352 02/22/17 1027  NA 141 141  K 3.3* 3.1*  CL 108 108  CO2 26 28  GLUCOSE 89 85  BUN 8 6  CREATININE 0.73 0.66  CALCIUM 9.1 8.6*     Studies/Results: Nm Hepato W/eject Fract  Result Date: 02/22/2017 CLINICAL DATA:  Acute RIGHT upper quadrant abdominal pain, 2 months post gastric bypass EXAM: NUCLEAR MEDICINE HEPATOBILIARY IMAGING WITH GALLBLADDER EF TECHNIQUE: Sequential images of the abdomen were obtained out to 60 minutes following intravenous administration of radiopharmaceutical. After oral ingestion of Ensure, gallbladder ejection fraction was determined. At 60 min, normal ejection fraction is greater than 33%. RADIOPHARMACEUTICALS:  5.4 mCi Tc-65m  Choletec IV COMPARISON:  Mary Williams FINDINGS: Normal tracer extraction from bloodstream indicating normal hepatocellular function. Normal excretion of tracer into biliary tree. Gallbladder visualized at 14  min. Small bowel visualized at 17 min. No hepatic retention of tracer. Subjectively normal emptying of tracer from gallbladder following fatty meal stimulation. Calculated gallbladder ejection fraction is 40%, normal. Patient reported no symptoms following Ensure ingestion. Normal gallbladder ejection fraction following Ensure ingestion is greater than 33% at 1 hour. IMPRESSION: Normal exam. Electronically Signed   By: Lavonia Dana M.D.   On: 02/22/2017 15:12   Dg Chest Port 1 View  Result Date: 02/23/2017 CLINICAL DATA:  42 year old female with a history of right-sided abdominal pain EXAM: PORTABLE CHEST 1 VIEW COMPARISON:  12/28/2016 FINDINGS: Cardiomediastinal silhouette unchanged in size and contour. No pneumothorax or pleural effusion.  No confluent airspace disease. No acute bony abnormality IMPRESSION: No radiographic evidence of acute cardiopulmonary disease Electronically Signed   By: Corrie Mckusick D.O.   On: 02/23/2017 11:14    Anti-infectives: Anti-infectives (From admission, onward)   Start     Dose/Rate Route Frequency Ordered Stop   02/23/17 1045  clindamycin (CLEOCIN) IVPB 900 mg     900 mg 100 mL/hr over 30 Minutes Intravenous On call to O.R. 02/23/17 1034 02/24/17 0559   02/23/17 1045  gentamicin (GARAMYCIN) 360 mg in dextrose 5 % 100 mL IVPB     5 mg/kg  71.7 kg (Adjusted) 109 mL/hr over 60 Minutes Intravenous On call to O.R. 02/23/17 1034 02/23/17 1243      Assessment/Plan: s/p Procedure(s): DIAGNOSTIC LAPAROSCOPY LAPAROSCOPIC CHOLECYSTECTOMY ESOPHAGOGASTRODUODENOSCOPY (EGD) Presented with severe right upper quadrant abdominal pain.  Laparoscopy showed edematous gallbladder consistent with a calculus cholecystitis.  Extensive exploration otherwise unremarkable.  She still has right upper quadrant pain  but it is improved.  Stable.  Try clear liquid diet today and ambulate and observe in hospital today.   LOS: 2 days    Edward Jolly 02/24/2017

## 2017-02-25 LAB — COMPREHENSIVE METABOLIC PANEL
ALBUMIN: 3 g/dL — AB (ref 3.5–5.0)
ALT: 39 U/L (ref 14–54)
AST: 46 U/L — AB (ref 15–41)
Alkaline Phosphatase: 50 U/L (ref 38–126)
Anion gap: 7 (ref 5–15)
BILIRUBIN TOTAL: 0.1 mg/dL — AB (ref 0.3–1.2)
BUN: 7 mg/dL (ref 6–20)
CHLORIDE: 106 mmol/L (ref 101–111)
CO2: 26 mmol/L (ref 22–32)
Calcium: 8.5 mg/dL — ABNORMAL LOW (ref 8.9–10.3)
Creatinine, Ser: 0.61 mg/dL (ref 0.44–1.00)
GFR calc Af Amer: >60 mL/min (ref >60)
GFR calc non Af Amer: >60 mL/min (ref >60)
GLUCOSE: 76 mg/dL (ref 65–99)
POTASSIUM: 3.5 mmol/L (ref 3.5–5.1)
Sodium: 139 mmol/L (ref 135–145)
Total Protein: 5.5 g/dL — ABNORMAL LOW (ref 6.5–8.1)

## 2017-02-25 LAB — URINALYSIS, ROUTINE W REFLEX MICROSCOPIC
Bilirubin Urine: NEGATIVE
Glucose, UA: NEGATIVE mg/dL
KETONES UR: 80 mg/dL — AB
Leukocytes, UA: NEGATIVE
Nitrite: NEGATIVE
PROTEIN: NEGATIVE mg/dL
Specific Gravity, Urine: 1.016 (ref 1.005–1.030)
pH: 5 (ref 5.0–8.0)

## 2017-02-25 LAB — CBC
HEMATOCRIT: 36.1 % (ref 36.0–46.0)
Hemoglobin: 11.5 g/dL — ABNORMAL LOW (ref 12.0–15.0)
MCH: 28.2 pg (ref 26.0–34.0)
MCHC: 31.9 g/dL (ref 30.0–36.0)
MCV: 88.5 fL (ref 78.0–100.0)
Platelets: 244 10*3/uL (ref 150–400)
RBC: 4.08 MIL/uL (ref 3.87–5.11)
RDW: 13.8 % (ref 11.5–15.5)
WBC: 10.3 10*3/uL (ref 4.0–10.5)

## 2017-02-25 NOTE — Progress Notes (Signed)
Patient ID: Mary Williams, female   DOB: Sep 09, 1975, 42 y.o.   MRN: [id number] 2 Days Post-Op   Subjective: Feels "a little bit" better in terms of her upper abdominal pain.  Still significant pain but trending down.  Still has nausea with liquids but no vomiting.  Complaining of new onset of urinary urgency and burning.  Has been walking in the halls.  Objective: Vital signs in last 24 hours: Temp:  [97.8 F (36.6 C)-98.6 F (37 C)] 97.8 F (36.6 C) (01/03 0609) Pulse Rate:  [59-74] 59 (01/03 0609) Resp:  [18] 18 (01/03 0609) BP: (107-122)/(55-71) 116/71 (01/03 0609) SpO2:  [96 %-99 %] 98 % (01/03 0609) Last BM Date: 02/21/17  Intake/Output from previous day: 01/02 0701 - 01/03 0700 In: 480 [P.O.:480] Out: 1800 [Urine:1800] Intake/Output this shift: No intake/output data recorded.  General appearance: alert, cooperative and mild distress GI: Mild to moderate upper abdominal tenderness, seems mostly peri-incisional. Incision/Wound: No erythema or drainage.  Mild papular rash under OpSite dressings.  Lab Results:  Recent Labs    02/22/17 1027 02/25/17 0431  WBC 8.1 10.3  HGB 12.4 11.5*  HCT 38.0 36.1  PLT 292 244   BMET Recent Labs    02/22/17 1027 02/25/17 0431  NA 141 139  K 3.1* 3.5  CL 108 106  CO2 28 26  GLUCOSE 85 76  BUN 6 7  CREATININE 0.66 0.61  CALCIUM 8.6* 8.5*     Studies/Results: Dg Chest Port 1 View  Result Date: 02/23/2017 CLINICAL DATA:  42 year old female with a history of right-sided abdominal pain EXAM: PORTABLE CHEST 1 VIEW COMPARISON:  12/28/2016 FINDINGS: Cardiomediastinal silhouette unchanged in size and contour. No pneumothorax or pleural effusion.  No confluent airspace disease. No acute bony abnormality IMPRESSION: No radiographic evidence of acute cardiopulmonary disease Electronically Signed   By: Corrie Mckusick D.O.   On: 02/23/2017 11:14    Anti-infectives: Anti-infectives (From admission, onward)   Start     Dose/Rate Route  Frequency Ordered Stop   02/23/17 1045  clindamycin (CLEOCIN) IVPB 900 mg     900 mg 100 mL/hr over 30 Minutes Intravenous On call to O.R. 02/23/17 1034 02/24/17 0559   02/23/17 1045  gentamicin (GARAMYCIN) 360 mg in dextrose 5 % 100 mL IVPB     5 mg/kg  71.7 kg (Adjusted) 109 mL/hr over 60 Minutes Intravenous On call to O.R. 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  Extensive negative workup.  Continues to have more pain and nausea than I would expect but it is trending the right direction.  I do not see evidence of a postoperative complication.  She has had an extensive workup and no current abnormalities on lab work.  Continue symptomatic management.  Check urinalysis due to dysuria.  Hopefully can get home tomorrow.   LOS: 3 days    Edward Jolly 02/25/2017

## 2017-02-26 MED ORDER — HYDROCORTISONE 1 % EX CREA
TOPICAL_CREAM | Freq: Once | CUTANEOUS | Status: AC
  Administered 2017-02-26: 11:00:00 via TOPICAL
  Filled 2017-02-26: qty 28

## 2017-02-26 MED ORDER — OXYCODONE HCL 5 MG PO TABS: 5.0000 mg | ORAL_TABLET | Freq: Four times a day (QID) | ORAL | 0 refills | Status: DC | PRN

## 2017-02-26 MED ORDER — HYDROCORTISONE 1 % EX CREA: TOPICAL_CREAM | Freq: Three times a day (TID) | CUTANEOUS | 0 refills | Status: DC

## 2017-02-26 NOTE — Discharge Summary (Signed)
Patient ID: Mary Williams [id number] 41 y.o. 1975/06/25  02/21/2017  Discharge date and time: 02/26/2017   Admitting Physician: Edward Jolly  Discharge Physician: Edward Jolly  Admission Diagnoses:  RUQ abdominal pain [R10.11]  Discharge Diagnoses: Same, possible biliary dyskinesia  Operations: Procedure(s): DIAGNOSTIC LAPAROSCOPY LAPAROSCOPIC CHOLECYSTECTOMY ESOPHAGOGASTRODUODENOSCOPY (EGD)  Admission Condition: fair  Discharged Condition: good  Indication for Admission: Patient is about 7 weeks status post laparoscopic Roux-en-Y gastric bypass, revisional surgery from sleeve gastrectomy due to severe reflux.  She presents with the acute onset of severe pressure-like right upper quadrant and epigastric abdominal pain.    Hospital Course: She was evaluated on the day of admission with a negative abdominal ultrasound and negative CT scan of the abdomen.  She was admitted for further observation and treatment.  Her pain continued and the following day she underwent a HIDA scan which visualized her gallbladder but pain was reproduced with stimulation of her gallbladder.  She was therefore taken to the operating room by Dr. gross where she underwent a thorough exploration of her GI tract laparoscopically without abnormalities seen other than a somewhat edematous boggy gallbladder.  She underwent intraoperative upper endoscopy was negative.  He performed cholecystectomy.  Postoperatively she continued to have pain and nausea but it was immediately slightly better and continued to gradually improve over the next several days.  She had negative lab work and vital signs were normal.  She did develop an atopic rash around her incisions which was treated with hydrocortisone.  Pathology showed normal gallbladder tissue.  On the day of discharge her pain and nausea has not completely resolved but is much improved in control.  She is ambulatory.  Tolerating fluids.  Abdomen is  benign.   Disposition: Home  Patient Instructions:  Allergies as of 02/26/2017      Reactions   Fish Allergy Anaphylaxis   Penicillins Anaphylaxis   Has patient had a PCN reaction causing immediate rash, facial/tongue/throat swelling, SOB or lightheadedness with hypotension: Yes Has patient had a PCN reaction causing severe rash involving mucus membranes or skin necrosis: No Has patient had a PCN reaction that required hospitalization No Has patient had a PCN reaction occurring within the last 10 years: No If all of the above answers are "NO", then may proceed with Cephalosporin use.   Shellfish Allergy Anaphylaxis   Ceclor [cefaclor] Hives      Medication List    TAKE these medications   acetaminophen 500 MG tablet Commonly known as:  TYLENOL Take 1,000 mg by mouth every 8 (eight) hours as needed for mild pain or headache.   BARIATRIC MULTIVITAMINS/IRON PO Take 1 capsule by mouth daily.   calcium carbonate 500 MG chewable tablet Commonly known as:  TUMS - dosed in mg elemental calcium Chew 3 tablets by mouth daily.   escitalopram 20 MG tablet Commonly known as:  LEXAPRO Take 20 mg by mouth at bedtime.   hydrocortisone cream 1 % Apply topically 3 (three) times daily.   oxyCODONE 5 MG immediate release tablet Commonly known as:  Oxy IR/ROXICODONE Take 1 tablet (5 mg total) by mouth every 6 (six) hours as needed for moderate pain, severe pain or breakthrough pain.   pantoprazole 40 MG tablet Commonly known as:  PROTONIX Take 40 mg by mouth daily after breakfast.       Activity: activity as tolerated Diet: Very gastric protein shakes to advance to solid bariatric diet as tolerated Wound Care: Hydrocortisone cream to rash 3 times daily  Follow-up:  With Dr. Excell Seltzer in 2 weeks.  Signed: Edward Jolly MD, FACS  02/26/2017, 10:13 AM

## 2017-02-26 NOTE — Progress Notes (Signed)
Patient ID: Mary Williams, female   DOB: [birth year], 42 y.o.   MRN: [id number] 3 Days Post-Op   Subjective: Gradually feeling better each day.  Less nausea and less pain.  Has been ambulatory.  Tolerating fluids.  Has itching rash on abdomen around incisions  Objective: Vital signs in last 24 hours: Temp:  [98.4 F (36.9 C)] 98.4 F (36.9 C) (01/04 0514) Pulse Rate:  [62-67] 62 (01/04 0514) Resp:  [16-17] 16 (01/04 0514) BP: (103-115)/(67-75) 111/75 (01/04 0514) SpO2:  [94 %-97 %] 97 % (01/04 0514) Last BM Date: 02/21/17  Intake/Output from previous day: 01/03 0701 - 01/04 0700 In: 330 [P.O.:330] Out: 1450 [Urine:1450] Intake/Output this shift: Total I/O In: -  Out: 250 [Urine:250]  General appearance: alert, cooperative and no distress GI: normal findings: soft, non-tender and Nondistended Incision/Wound: Papular rash around incisions were dressing had been  Lab Results:  Recent Labs    02/25/17 0431  WBC 10.3  HGB 11.5*  HCT 36.1  PLT 244   BMET Recent Labs    02/25/17 0431  NA 139  K 3.5  CL 106  CO2 26  GLUCOSE 76  BUN 7  CREATININE 0.61  CALCIUM 8.5*     Studies/Results: Pathology on gallbladder was unremarkable  Anti-infectives: Anti-infectives (From admission, onward)   Start     Dose/Rate Route Frequency Ordered Stop   02/23/17 1045  clindamycin (CLEOCIN) IVPB 900 mg     900 mg 100 mL/hr over 30 Minutes Intravenous On call to O.R. 02/23/17 1034 02/24/17 0559   02/23/17 1045  gentamicin (GARAMYCIN) 360 mg in dextrose 5 % 100 mL IVPB     5 mg/kg  71.7 kg (Adjusted) 109 mL/hr over 60 Minutes Intravenous On call to O.R. 02/23/17 1034 02/23/17 1243      Assessment/Plan: s/p Procedure(s): DIAGNOSTIC LAPAROSCOPY LAPAROSCOPIC CHOLECYSTECTOMY ESOPHAGOGASTRODUODENOSCOPY (EGD) Possible biliary dyskinesia. Thorough workup otherwise negative and she continues to improve.  She does have apparent atopic dermatitis from her dressings.  Start  cortisone cream.  Okay for discharge today.   LOS: 4 days    Edward Jolly 02/26/2017

## 2017-02-26 NOTE — Progress Notes (Signed)
Pt was discharged home today. Instructions were reviewed with patient,prescriptions were given to patient and questions were answered. Pt was taken to main entrance via wheelchair by NT.  

## 2017-03-11 ENCOUNTER — Encounter: Payer: BC Managed Care – PPO | Attending: General Surgery | Admitting: Skilled Nursing Facility1

## 2017-03-11 ENCOUNTER — Encounter: Payer: Self-pay | Admitting: Skilled Nursing Facility1

## 2017-03-11 DIAGNOSIS — Z713 Dietary counseling and surveillance: Secondary | ICD-10-CM | POA: Insufficient documentation

## 2017-03-11 DIAGNOSIS — Z6841 Body Mass Index (BMI) 40.0 and over, adult: Secondary | ICD-10-CM

## 2017-03-11 NOTE — Progress Notes (Signed)
Post-Operative RYGB Surgery  Primary concerns today: Post-operative Bariatric Surgery Nutrition Management.  Pt states she had her gallbladder removed 2 weeks ago. Pt states she has not had an apetite and times herself to eat. Pt states she cannot tolerate hamburger and chicken feeling like they get stuck. Pt states she can eat eggs now. Pt states she does not feel like she is getting enough protein. Pt states last year after the sleeve she feels completely different feeling like this is harder. Pt states crystal light causes diarrhea. Pt states lunch meat makes her sick. Pt states she feels nauseous when drinking plain water.    Surgery date: 12/29/2016 Surgery type: RYGB converted from Sleeve Start weight at [facility]: 285 Weight today: 205.4 Weight change: 19.8  TANITA  BODY COMP RESULTS  01/12/2017 03/11/2016   BMI (kg/m^2) 38.7 35.3   Fat Mass (lbs) 103.8 88   Fat Free Mass (lbs) 121.4 117.4   Total Body Water (lbs) 88.2 84.8    24-hr recall: usually 2-3 slices of cheese and 2 yogurts B (AM): 1 egg and cheese Snk (AM):  Cheese stick  L (PM): greek yogurt  Snk (PM):  D (PM): chicken or egg or pork or yogurt Snk (PM):   Fluid intake: water with crystal light, coffee with sugar free creamer, unsweet tea: 64-70 Estimated total protein intake: 50+  Medications: See List Supplementation: multivitamin okayed capsule   Using straws: no Drinking while eating: trying not to Having you been chewing well: yes Chewing/swallowing difficulties: no Changes in vision: no Changes to mood/headaches: no Hair loss/Cahnges to skin/Changes to nails: no Any difficulty focusing or concentrating: no Sweating: no Dizziness/Lightheaded: no Palpitations: no Carbonated beverages: no N/V/D/C/GAS: some diarrhea  Abdominal Pain: no Dumping syndrome: no  Recent physical activity:  ADL's  Progress Towards Goal(s):  In progress.  Handouts given during visit include:  Vegetables and vegetarian  proteins    Nutritional Diagnosis:  [place]-3.3 Overweight/obesity related to past poor dietary habits and physical inactivity as evidenced by patient w/ recent RYGBsurgery following dietary guidelines for continued weight loss.  Intervention:  Nutrition counseling. Goals:  -Set reminders to eat and drink in baritastic app -Only have nuts and seeds 1 time a day -Eat every 3 hours -Try the vegetarian proteins   Teaching Method Utilized:  Visual Auditory Hands on  Barriers to learning/adherence to lifestyle change: not feeling hunger  Demonstrated degree of understanding via:  Teach Back   Monitoring/Evaluation:  Dietary intake, exercise, and body weight.

## 2017-03-16 ENCOUNTER — Ambulatory Visit
Admission: RE | Admit: 2017-03-16 | Discharge: 2017-03-16 | Disposition: A | Discharge status: Home / Self Care | Payer: BC Managed Care – PPO | Source: Ambulatory Visit | Attending: General Surgery | Admitting: General Surgery

## 2017-03-16 ENCOUNTER — Other Ambulatory Visit: Payer: Self-pay | Admitting: Student

## 2017-03-16 DIAGNOSIS — K3580 Unspecified acute appendicitis: Secondary | ICD-10-CM

## 2017-03-16 DIAGNOSIS — R109 Unspecified abdominal pain: Secondary | ICD-10-CM

## 2017-03-16 MED ORDER — IOPAMIDOL (ISOVUE-300) INJECTION 61%
125.0000 mL | Freq: Once | INTRAVENOUS | Status: AC | PRN
  Administered 2017-03-16: 125 mL via INTRAVENOUS

## 2017-04-21 ENCOUNTER — Ambulatory Visit: Payer: Self-pay | Admitting: Skilled Nursing Facility1

## 2017-04-29 ENCOUNTER — Other Ambulatory Visit: Payer: Self-pay | Admitting: General Surgery

## 2017-04-29 DIAGNOSIS — R109 Unspecified abdominal pain: Secondary | ICD-10-CM

## 2017-04-30 ENCOUNTER — Ambulatory Visit
Admission: RE | Admit: 2017-04-30 | Discharge: 2017-04-30 | Disposition: A | Discharge status: Home / Self Care | Payer: BC Managed Care – PPO | Source: Ambulatory Visit | Attending: General Surgery | Admitting: General Surgery

## 2017-04-30 DIAGNOSIS — R109 Unspecified abdominal pain: Secondary | ICD-10-CM

## 2017-04-30 MED ORDER — IOPAMIDOL (ISOVUE-300) INJECTION 61%
100.0000 mL | Freq: Once | INTRAVENOUS | Status: AC | PRN
  Administered 2017-04-30: 100 mL via INTRAVENOUS

## 2017-05-11 ENCOUNTER — Other Ambulatory Visit: Payer: Self-pay

## 2017-05-11 ENCOUNTER — Encounter (HOSPITAL_COMMUNITY): Payer: Self-pay | Admitting: Emergency Medicine

## 2017-05-11 DIAGNOSIS — R1084 Generalized abdominal pain: Secondary | ICD-10-CM | POA: Diagnosis present

## 2017-05-11 DIAGNOSIS — R112 Nausea with vomiting, unspecified: Secondary | ICD-10-CM | POA: Diagnosis not present

## 2017-05-11 DIAGNOSIS — Z79899 Other long term (current) drug therapy: Secondary | ICD-10-CM | POA: Insufficient documentation

## 2017-05-11 DIAGNOSIS — R197 Diarrhea, unspecified: Secondary | ICD-10-CM | POA: Insufficient documentation

## 2017-05-11 NOTE — ED Triage Notes (Signed)
Pt is c/o abd pain mainly on the left but on the right too  Pt states she had her gallbladder out in Jan and has been having problems ever since  Pt states she has vomiting and diarrhea  Pt states she has diarrhea several times a day since she had her gallbladder removed  Pt is seeing GI for same  Pt is also a bariatric pt and has had a gastric sleeve Pt states the pain is a stabbing pain and it got worse tonight after eating  Dr Excell Seltzer did her GB surgery

## 2017-05-12 ENCOUNTER — Emergency Department (HOSPITAL_COMMUNITY): Payer: BC Managed Care – PPO

## 2017-05-12 ENCOUNTER — Emergency Department (HOSPITAL_COMMUNITY)
Admission: EM | Admit: 2017-05-12 | Discharge: 2017-05-12 | Disposition: A | Discharge status: Home / Self Care | Payer: BC Managed Care – PPO | Attending: Emergency Medicine | Admitting: Emergency Medicine

## 2017-05-12 ENCOUNTER — Encounter (HOSPITAL_COMMUNITY): Payer: Self-pay

## 2017-05-12 DIAGNOSIS — R1084 Generalized abdominal pain: Secondary | ICD-10-CM

## 2017-05-12 DIAGNOSIS — R112 Nausea with vomiting, unspecified: Secondary | ICD-10-CM

## 2017-05-12 DIAGNOSIS — R197 Diarrhea, unspecified: Secondary | ICD-10-CM

## 2017-05-12 LAB — URINALYSIS, ROUTINE W REFLEX MICROSCOPIC
BILIRUBIN URINE: NEGATIVE
Glucose, UA: NEGATIVE mg/dL
HGB URINE DIPSTICK: NEGATIVE
KETONES UR: NEGATIVE mg/dL
Leukocytes, UA: NEGATIVE
Nitrite: NEGATIVE
PH: 5 (ref 5.0–8.0)
Protein, ur: NEGATIVE mg/dL
SPECIFIC GRAVITY, URINE: 1.018 (ref 1.005–1.030)

## 2017-05-12 LAB — COMPREHENSIVE METABOLIC PANEL
ALBUMIN: 3.8 g/dL (ref 3.5–5.0)
ALK PHOS: 70 U/L (ref 38–126)
ALT: 21 U/L (ref 14–54)
ANION GAP: 5 (ref 5–15)
AST: 27 U/L (ref 15–41)
BILIRUBIN TOTAL: 0.3 mg/dL (ref 0.3–1.2)
BUN: 11 mg/dL (ref 6–20)
CO2: 29 mmol/L (ref 22–32)
Calcium: 9.3 mg/dL (ref 8.9–10.3)
Chloride: 109 mmol/L (ref 101–111)
Creatinine, Ser: 0.67 mg/dL (ref 0.44–1.00)
GFR calc Af Amer: >60 mL/min (ref >60)
GFR calc non Af Amer: >60 mL/min (ref >60)
GLUCOSE: 99 mg/dL (ref 65–99)
Potassium: 4.2 mmol/L (ref 3.5–5.1)
SODIUM: 143 mmol/L (ref 135–145)
TOTAL PROTEIN: 6.6 g/dL (ref 6.5–8.1)

## 2017-05-12 LAB — CBC
HCT: 38.3 % (ref 36.0–46.0)
HEMOGLOBIN: 12.6 g/dL (ref 12.0–15.0)
MCH: 29.4 pg (ref 26.0–34.0)
MCHC: 32.9 g/dL (ref 30.0–36.0)
MCV: 89.3 fL (ref 78.0–100.0)
Platelets: 332 10*3/uL (ref 150–400)
RBC: 4.29 MIL/uL (ref 3.87–5.11)
RDW: 13.7 % (ref 11.5–15.5)
WBC: 9.1 10*3/uL (ref 4.0–10.5)

## 2017-05-12 LAB — LIPASE, BLOOD: Lipase: 35 U/L (ref 11–51)

## 2017-05-12 MED ORDER — IOPAMIDOL (ISOVUE-300) INJECTION 61%
100.0000 mL | Freq: Once | INTRAVENOUS | Status: AC | PRN
  Administered 2017-05-12: 100 mL via INTRAVENOUS

## 2017-05-12 MED ORDER — ONDANSETRON HCL 4 MG/2ML IJ SOLN
4.0000 mg | Freq: Once | INTRAMUSCULAR | Status: AC
  Administered 2017-05-12: 4 mg via INTRAVENOUS
  Filled 2017-05-12: qty 2

## 2017-05-12 MED ORDER — SODIUM CHLORIDE 0.9 % IJ SOLN
INTRAMUSCULAR | Status: AC
  Filled 2017-05-12: qty 50

## 2017-05-12 MED ORDER — HALOPERIDOL LACTATE 5 MG/ML IJ SOLN
5.0000 mg | Freq: Once | INTRAMUSCULAR | Status: AC
Start: 2017-05-12 — End: 2017-05-12
  Administered 2017-05-12: 5 mg via INTRAVENOUS
  Filled 2017-05-12: qty 1

## 2017-05-12 MED ORDER — METOCLOPRAMIDE HCL 10 MG PO TABS: 10.0000 mg | ORAL_TABLET | Freq: Four times a day (QID) | ORAL | 0 refills | Status: DC

## 2017-05-12 MED ORDER — IOPAMIDOL (ISOVUE-300) INJECTION 61%
INTRAVENOUS | Status: AC
  Filled 2017-05-12: qty 100

## 2017-05-12 MED ORDER — HYDROMORPHONE HCL 1 MG/ML IJ SOLN
1.0000 mg | Freq: Once | INTRAMUSCULAR | Status: AC
  Administered 2017-05-12: 1 mg via INTRAVENOUS
  Filled 2017-05-12: qty 1

## 2017-05-12 MED ORDER — OXYCODONE-ACETAMINOPHEN 5-325 MG PO TABS: 1.0000 | ORAL_TABLET | ORAL | 0 refills | Status: DC | PRN

## 2017-05-12 NOTE — ED Provider Notes (Addendum)
[facility] DEPT Provider Note   CSN: [id number] Arrival date & time: 05/11/17  2318     History   Chief Complaint Chief Complaint  Patient presents with  . Abdominal Pain  . Nausea  . Emesis  . Diarrhea    HPI Mary Williams is a 42 y.o. female.  Patient with a history of gastric bypass with revision in 12/2015, lap cholecystectomy on 02/23/17 with persistent lower abdominal pain, nausea with infrequent vomiting and diarrhea of 6-7 non-bloody bowel movements daily since. No fever at any time. She denies urinary symptoms. She has been followed by her surgeon [facility]) as well as by [facility] ([facility]) for evaluation of symptoms without definitive diagnosis or cause. She was taking Norco at home with minimal relief but is out now. She reports nausea is persistent and emesis tonight had a small amount of blood present. Patient and family are frustrated with her medical condition and the lack of answers.   The history is provided by the patient. No language interpreter was used.  Abdominal Pain   Associated symptoms include diarrhea and vomiting. Pertinent negatives include fever.  Emesis   Associated symptoms include abdominal pain and diarrhea. Pertinent negatives include no chills and no fever.  Diarrhea   Associated symptoms include abdominal pain and vomiting. Pertinent negatives include no chills.    Past Medical History:  Diagnosis Date  . Anxiety   . Cancer ([place])    mOLAR PREGNACY CANCEROUS REMOVED  . GERD (gastroesophageal reflux disease)   . History of hiatal hernia     Patient Active Problem List   Diagnosis Date Noted  . Acute acalculous cholecystitis s/p lap cholecystectomy 02/23/2017 02/23/2017  . Right upper quadrant abdominal pain 02/22/2017  . 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 Anxiety   . GERD with esophagitis 12/29/2016  . Abdominal pain 07/27/2016  . Morbid obesity with BMI of  50.0-59.9, adult ([place]) 11/12/2015    Past Surgical History:  Procedure Laterality Date  . ABDOMINAL HYSTERECTOMY     ovaries left  . APPENDECTOMY    . CHOLECYSTECTOMY N/A 02/23/2017   Procedure: LAPAROSCOPIC CHOLECYSTECTOMY;  Surgeon: Michael Boston, MD;  Location: [facility];  Service: General;  Laterality: N/A;  . DILATION AND CURETTAGE OF UTERUS    . ESOPHAGOGASTRODUODENOSCOPY     possible dilation 12/14/16 Dr. Lucia Gaskins  . ESOPHAGOGASTRODUODENOSCOPY N/A 02/23/2017   Procedure: ESOPHAGOGASTRODUODENOSCOPY (EGD);  Surgeon: Michael Boston, MD;  Location: [facility];  Service: General;  Laterality: N/A;  . ESOPHAGOGASTRODUODENOSCOPY (EGD) WITH PROPOFOL N/A 12/14/2016   Procedure: ESOPHAGOGASTRODUODENOSCOPY (EGD) WITH PROPOFOL WITH POSSIBLE DILATATION;  Surgeon: Alphonsa Overall, MD;  [facility];  Service: General;  Laterality: N/A;  . HERNIA REPAIR    . LAPAROSCOPIC CHOLECYSTECTOMY SINGLE SITE WITH INTRAOPERATIVE CHOLANGIOGRAM N/A 02/23/2017   Procedure: DIAGNOSTIC LAPAROSCOPY;  Surgeon: Michael Boston, MD;  Location: [facility];  Service: General;  Laterality: N/A;  . LAPAROSCOPIC GASTRIC SLEEVE RESECTION WITH HIATAL HERNIA REPAIR N/A 11/12/2015   Procedure: LAPAROSCOPIC GASTRIC SLEEVE RESECTION WITH HIATAL HERNIA REPAIR WITH UPPER ENDO;  Surgeon: Excell Seltzer, MD;  Location: [facility];  Service: General;  Laterality: N/A;  . LAPAROSCOPIC ROUX-EN-Y GASTRIC BYPASS WITH HIATAL HERNIA REPAIR  12/29/2016   Procedure: LAPAROSCOPIC ROUX-EN-Y GASTRIC BYPASS WITH HIATAL HERNIA REPAIR;  Surgeon: Excell Seltzer, MD;  Location: [facility];  Service: General;;  . LAPAROSCOPY N/A 07/29/2016   Procedure: LAPAROSCOPY DIAGNOSTIC with appendectomy and lysis of adhesions;  Surgeon: Judeth Horn,  MD;  Location: [facility];  Service: General;  Laterality: N/A;  . UPPER GI ENDOSCOPY  11/12/2015   Procedure: UPPER GI ENDOSCOPY;  Surgeon: Excell Seltzer, MD;  Location: [facility];  Service: General;;  . UPPER GI ENDOSCOPY  12/29/2016     Procedure: UPPER GI ENDOSCOPY;  Surgeon: Excell Seltzer, MD;  Location: [facility];  Service: General;;    OB History    No data available       Home Medications    Prior to Admission medications   Medication Sig Start Date End Date Taking? Authorizing Provider  acetaminophen (TYLENOL) 500 MG tablet Take 1,000 mg by mouth every 8 (eight) hours as needed for mild pain or headache.   Yes [provider]  cholestyramine (QUESTRAN) 4 g packet Take 1 packet by mouth daily. 04/01/17  Yes [provider]  escitalopram (LEXAPRO) 20 MG tablet Take 20 mg by mouth at bedtime.    Yes [provider]  HYDROcodone-acetaminophen (NORCO/VICODIN) 5-325 MG tablet Take 1 tablet by mouth every 4 (four) hours as needed for moderate pain.  04/20/17  Yes [provider]  Multiple Vitamins-Minerals (BARIATRIC MULTIVITAMINS/IRON PO) Take 1 capsule by mouth daily.   Yes [provider]  pantoprazole (PROTONIX) 40 MG tablet Take 40 mg by mouth daily after breakfast.  11/06/16  Yes [provider]  hydrocortisone cream 1 % Apply topically 3 (three) times daily. Patient not taking: Reported on 05/12/2017 02/26/17   Excell Seltzer, MD  oxyCODONE (OXY IR/ROXICODONE) 5 MG immediate release tablet Take 1 tablet (5 mg total) by mouth every 6 (six) hours as needed for moderate pain, severe pain or breakthrough pain. Patient not taking: Reported on 05/12/2017 02/26/17   Excell Seltzer, MD    Family History Family History  Problem Relation Age of Onset  . Hypertension Other   . Asthma Other   . Cancer Other   . Diabetes Other     Social History Social History   Tobacco Use  . Smoking status: Never Smoker  . Smokeless tobacco: Never Used  Substance Use Topics  . Alcohol use: No    Frequency: Never  . Drug use: No     Allergies   Fish allergy; Penicillins; Shellfish allergy; and Ceclor [cefaclor]   Review of Systems Review of Systems   Constitutional: Negative for chills and fever.  Respiratory: Negative.   Cardiovascular: Negative.   Gastrointestinal: Positive for abdominal pain, diarrhea and vomiting.  Genitourinary: Negative.   Musculoskeletal: Negative.   Skin: Negative.   Neurological: Positive for weakness and light-headedness.     Physical Exam Updated Vital Signs BP (!) 131/103   Pulse 84   Temp 97.8 F (36.6 C) (Oral)   Resp (!) 24   Ht 5\' 4"  (1.626 m)   Wt 92.5 kg (204 lb)   SpO2 99%   BMI 35.02 kg/m   Physical Exam  Constitutional: She appears well-developed and well-nourished. She appears distressed.  Uncomfortable appearing.  HENT:  Head: Normocephalic.  Neck: Normal range of motion. Neck supple.  Cardiovascular: Normal rate and regular rhythm.  Pulmonary/Chest: Effort normal and breath sounds normal.  Abdominal: Soft. Bowel sounds are normal. There is no tenderness. There is no rebound and no guarding.  Musculoskeletal: Normal range of motion.  Neurological: She is alert. No cranial nerve deficit.  Skin: Skin is warm and dry. No rash noted.  Psychiatric: She has a normal mood and affect.     ED Treatments / Results  Labs (all  labs ordered are listed, but only abnormal results are displayed) Labs Reviewed  URINALYSIS, ROUTINE W REFLEX MICROSCOPIC - Abnormal; Notable for the following components:      Result Value   APPearance HAZY (*)    All other components within normal limits  LIPASE, BLOOD  COMPREHENSIVE METABOLIC PANEL  CBC   Results for orders placed or performed during the hospital encounter of 05/12/17  Lipase, blood  Result Value Ref Range   Lipase 35 11 - 51 U/L  Comprehensive metabolic panel  Result Value Ref Range   Sodium 143 135 - 145 mmol/L   Potassium 4.2 3.5 - 5.1 mmol/L   Chloride 109 101 - 111 mmol/L   CO2 29 22 - 32 mmol/L   Glucose, Bld 99 65 - 99 mg/dL   BUN 11 6 - 20 mg/dL   Creatinine, Ser 0.67 0.44 - 1.00 mg/dL   Calcium 9.3 8.9 - 10.3 mg/dL    Total Protein 6.6 6.5 - 8.1 g/dL   Albumin 3.8 3.5 - 5.0 g/dL   AST 27 15 - 41 U/L   ALT 21 14 - 54 U/L   Alkaline Phosphatase 70 38 - 126 U/L   Total Bilirubin 0.3 0.3 - 1.2 mg/dL   GFR calc non Af Amer >60 >60 mL/min   GFR calc Af Amer >60 >60 mL/min   Anion gap 5 5 - 15  CBC  Result Value Ref Range   WBC 9.1 4.0 - 10.5 K/uL   RBC 4.29 3.87 - 5.11 MIL/uL   Hemoglobin 12.6 12.0 - 15.0 g/dL   HCT 38.3 36.0 - 46.0 %   MCV 89.3 78.0 - 100.0 fL   MCH 29.4 26.0 - 34.0 pg   MCHC 32.9 30.0 - 36.0 g/dL   RDW 13.7 11.5 - 15.5 %   Platelets 332 150 - 400 K/uL  Urinalysis, Routine w reflex microscopic  Result Value Ref Range   Color, Urine YELLOW YELLOW   APPearance HAZY (A) CLEAR   Specific Gravity, Urine 1.018 1.005 - 1.030   pH 5.0 5.0 - 8.0   Glucose, UA NEGATIVE NEGATIVE mg/dL   Hgb urine dipstick NEGATIVE NEGATIVE   Bilirubin Urine NEGATIVE NEGATIVE   Ketones, ur NEGATIVE NEGATIVE mg/dL   Protein, ur NEGATIVE NEGATIVE mg/dL   Nitrite NEGATIVE NEGATIVE   Leukocytes, UA NEGATIVE NEGATIVE    EKG  EKG Interpretation None       Radiology No results found.  Procedures Procedures (including critical care time)  Medications Ordered in ED Medications  ondansetron (ZOFRAN) injection 4 mg (not administered)  HYDROmorphone (DILAUDID) injection 1 mg (not administered)     Initial Impression / Assessment and Plan / ED Course  I have reviewed the triage vital signs and the nursing notes.  Pertinent labs & imaging results that were available during my care of the patient were reviewed by me and considered in my medical decision making (see chart for details).     Patient presents with lower abdominal pain persistent since January after having a lap cholecystectomy. She has ongoing evaluation by surgery and GI, presents with uncontrolled symptoms. No new symptoms. No fever. She denies significant drop in her weight in the last 2 months.  She has had multiple surgeries in  the past. Labs are reassuring, however, repeat CT scan felt necessary given degree of tenderness and patient past history. CT scan negative for acute finding. Pain remains uncontrolled. IV haldol ordered for control of pain and nausea. Will need reassessment.  Patient care signed out to Geanie Kenning, PA-C, for final recheck and determination of disposition. Anticipate discharge home.   Final Clinical Impressions(s) / ED Diagnoses   Final diagnoses:  None   1. Persistent abdominal pain 2. Vomiting and diarrhea  ED Discharge Orders    None       Dennie Bible 05/12/17 [place], April, MD 05/12/17 0657    Charlann Lange, PA-C 05/15/17 0106    Palumbo, April, MD 05/15/17 [id number]

## 2017-05-12 NOTE — ED Provider Notes (Signed)
Received signout at shift change from [place].  Please see her note for further details of patient's history.  In brief patient, patient with a history of chronic lower abdominal pain which has been persistent since January after having a laparoscopic cholecystectomy.  She has been seeing surgery ([facility]) and GI ([facility]) and presents for uncontrolled symptoms.    Blood work drawn in the emergency department was reassuring.  CBC, CMP and lipase within normal.  No evidence of infection on UA.  Patient had a CT abdomen/pelvis which did not show acute intra-abdominal abnormality.  On recheck, patient reports that she is feeling better and would like to go home.  Repeat abdominal exam soft and nontender to palpation. She is tolerating p.o. fluids at the bedside.  She is aware that she needs to follow-up with her GI doctor for further pain management.   Discussed return precautions and patient agrees and voiced understanding to the above plan.  She has no combines prior to discharge.  Vitals:   05/12/17 0800 05/12/17 0847  BP: 130/85 106/68  Pulse:  (!) 56  Resp: 18   Temp:    SpO2:  95%      Bernarda Caffey 05/12/17 0076    Palumbo, April, MD 05/12/17 2320

## 2017-05-12 NOTE — Discharge Instructions (Signed)
Follow up with Dr. Paulita Fujita and with Dr. Excell Seltzer for ongoing evaluation of persistent abdominal pain, vomiting, diarrhea.

## 2017-05-12 NOTE — ED Notes (Addendum)
Patient states she has been on a bland diet of toast and crackers the past few days due to the diarrhea. Today she tried cooked vegetables and it worsened her symptoms. Patient states there was some blood in her vomit today.

## 2017-06-04 ENCOUNTER — Ambulatory Visit: Payer: Self-pay | Admitting: General Surgery

## 2017-06-09 NOTE — Patient Instructions (Addendum)
Mary Williams  06/09/2017   Your procedure is scheduled on: 06-14-17  Report to [facility] Main  Entrance  Report to admitting at  1145 AM    Call this number if you have problems the morning of surgery [phone]   Remember: Do not eat food  :After Midnight.CLEAR LIQUIDS FROM MIDNIGHT UNTIL 745 AM DAY OF SURGERY, NOTHING BY MOUTH AFTER 745 AM DAY OF SURGERY, NO CANDY, NO MINTS, NO NOTHING BY MOUTH.    CLEAR LIQUID DIET   Foods Allowed                                                                     Foods Excluded  Coffee and tea, regular and decaf                             liquids that you cannot  Plain Jell-O in any flavor                                             see through such as: Fruit ices (not with fruit pulp)                                     milk, soups, orange juice  Iced Popsicles                                    All solid food Carbonated beverages, regular and diet                                    Cranberry, grape and apple juices Sports drinks like Gatorade Lightly seasoned clear broth or consume(fat free) Sugar, honey syrup  Sample Menu Breakfast                                Lunch                                     Supper Cranberry juice                    Beef broth                            Chicken broth Jell-O                                     Grape juice  Apple juice Coffee or tea                        Jell-O                                      Popsicle                                                Coffee or tea                        Coffee or tea  _____________________________________________________________________     Take these medicines the morning of surgery with A SIP OF WATER:  PANTAPRAZOLE                               You may not have any metal on your body including hair pins and              piercings  Do not wear jewelry, make-up, lotions, powders or perfumes,  deodorant             Do not wear nail polish.  Do not shave  48 hours prior to surgery.              Men may shave face and neck.   Do not bring valuables to the hospital. [place].  Contacts, dentures or bridgework may not be worn into surgery.  Leave suitcase in the car. After surgery it may be brought to your room.     Patients discharged the day of surgery will not be allowed to drive home.  Name and phone number of your driver: scott spouse cell [phone]  Special Instructions: N/A              Please read over the following fact sheets you were given: _____________________________________________________________________             [facility] - Preparing for Surgery Before surgery, you can play an important role.  Because skin is not sterile, your skin needs to be as free of germs as possible.  You can reduce the number of germs on your skin by washing with CHG (chlorahexidine gluconate) soap before surgery.  CHG is an antiseptic cleaner which kills germs and bonds with the skin to continue killing germs even after washing. Please DO NOT use if you have an allergy to CHG or antibacterial soaps.  If your skin becomes reddened/irritated stop using the CHG and inform your nurse when you arrive at Short Stay. Do not shave (including legs and underarms) for at least 48 hours prior to the first CHG shower.  You may shave your face/neck. Please follow these instructions carefully:  1.  Shower with CHG Soap the night before surgery and the  morning of Surgery.  2.  If you choose to wash your hair, wash your hair first as usual with your  normal  shampoo.  3.  After you shampoo, rinse your hair and body thoroughly to remove the  shampoo.  4.  Use CHG as you would any other liquid soap.  You can apply chg directly  to the skin and wash                       Gently with a scrungie or clean washcloth.  5.  Apply  the CHG Soap to your body ONLY FROM THE NECK DOWN.   Do not use on face/ open                           Wound or open sores. Avoid contact with eyes, ears mouth and genitals (private parts).                       Wash face,  Genitals (private parts) with your normal soap.             6.  Wash thoroughly, paying special attention to the area where your surgery  will be performed.  7.  Thoroughly rinse your body with warm water from the neck down.  8.  DO NOT shower/wash with your normal soap after using and rinsing off  the CHG Soap.                9.  Pat yourself dry with a clean towel.            10.  Wear clean pajamas.            11.  Place clean sheets on your bed the night of your first shower and do not  sleep with pets. Day of Surgery : Do not apply any lotions/deodorants the morning of surgery.  Please wear clean clothes to the hospital/surgery center.  FAILURE TO FOLLOW THESE INSTRUCTIONS MAY RESULT IN THE CANCELLATION OF YOUR SURGERY PATIENT SIGNATURE_________________________________  NURSE SIGNATURE__________________________________  ________________________________________________________________________

## 2017-06-10 ENCOUNTER — Encounter (HOSPITAL_COMMUNITY): Payer: Self-pay

## 2017-06-10 ENCOUNTER — Encounter (HOSPITAL_COMMUNITY)
Admission: RE | Admit: 2017-06-10 | Discharge: 2017-06-10 | Disposition: A | Discharge status: Home / Self Care | Payer: BC Managed Care – PPO | Source: Ambulatory Visit | Attending: General Surgery | Admitting: General Surgery

## 2017-06-10 ENCOUNTER — Other Ambulatory Visit: Payer: Self-pay

## 2017-06-10 DIAGNOSIS — Z01812 Encounter for preprocedural laboratory examination: Secondary | ICD-10-CM | POA: Diagnosis not present

## 2017-06-10 DIAGNOSIS — R109 Unspecified abdominal pain: Secondary | ICD-10-CM | POA: Insufficient documentation

## 2017-06-10 HISTORY — DX: Pneumonia, unspecified organism: J18.9

## 2017-06-10 LAB — CBC
HEMATOCRIT: 40.1 % (ref 36.0–46.0)
HEMOGLOBIN: 13.1 g/dL (ref 12.0–15.0)
MCH: 29.3 pg (ref 26.0–34.0)
MCHC: 32.7 g/dL (ref 30.0–36.0)
MCV: 89.7 fL (ref 78.0–100.0)
Platelets: 312 10*3/uL (ref 150–400)
RBC: 4.47 MIL/uL (ref 3.87–5.11)
RDW: 13.6 % (ref 11.5–15.5)
WBC: 7.8 10*3/uL (ref 4.0–10.5)

## 2017-06-14 ENCOUNTER — Encounter (HOSPITAL_COMMUNITY)
Admission: RE | Disposition: A | Discharge status: Home / Self Care | Payer: Self-pay | Source: Ambulatory Visit | Attending: General Surgery

## 2017-06-14 ENCOUNTER — Ambulatory Visit (HOSPITAL_COMMUNITY)
Admission: RE | Admit: 2017-06-14 | Discharge: 2017-06-14 | Disposition: A | Discharge status: Home / Self Care | Payer: BC Managed Care – PPO | Source: Ambulatory Visit | Attending: General Surgery | Admitting: General Surgery

## 2017-06-14 ENCOUNTER — Ambulatory Visit (HOSPITAL_COMMUNITY): Payer: BC Managed Care – PPO | Admitting: Certified Registered Nurse Anesthetist

## 2017-06-14 ENCOUNTER — Encounter (HOSPITAL_COMMUNITY): Payer: Self-pay | Admitting: Emergency Medicine

## 2017-06-14 DIAGNOSIS — R188 Other ascites: Secondary | ICD-10-CM | POA: Diagnosis not present

## 2017-06-14 DIAGNOSIS — F419 Anxiety disorder, unspecified: Secondary | ICD-10-CM | POA: Diagnosis not present

## 2017-06-14 DIAGNOSIS — Z888 Allergy status to other drugs, medicaments and biological substances status: Secondary | ICD-10-CM | POA: Insufficient documentation

## 2017-06-14 DIAGNOSIS — Z9884 Bariatric surgery status: Secondary | ICD-10-CM | POA: Diagnosis not present

## 2017-06-14 DIAGNOSIS — R197 Diarrhea, unspecified: Secondary | ICD-10-CM | POA: Insufficient documentation

## 2017-06-14 DIAGNOSIS — K219 Gastro-esophageal reflux disease without esophagitis: Secondary | ICD-10-CM | POA: Diagnosis not present

## 2017-06-14 DIAGNOSIS — Z79899 Other long term (current) drug therapy: Secondary | ICD-10-CM | POA: Diagnosis not present

## 2017-06-14 DIAGNOSIS — R112 Nausea with vomiting, unspecified: Secondary | ICD-10-CM | POA: Diagnosis not present

## 2017-06-14 DIAGNOSIS — Z880 Allergy status to penicillin: Secondary | ICD-10-CM | POA: Diagnosis not present

## 2017-06-14 DIAGNOSIS — Z6834 Body mass index (BMI) 34.0-34.9, adult: Secondary | ICD-10-CM | POA: Insufficient documentation

## 2017-06-14 DIAGNOSIS — Z934 Other artificial openings of gastrointestinal tract status: Secondary | ICD-10-CM | POA: Diagnosis not present

## 2017-06-14 DIAGNOSIS — R1032 Left lower quadrant pain: Secondary | ICD-10-CM | POA: Insufficient documentation

## 2017-06-14 DIAGNOSIS — Z8249 Family history of ischemic heart disease and other diseases of the circulatory system: Secondary | ICD-10-CM | POA: Diagnosis not present

## 2017-06-14 HISTORY — PX: ESOPHAGOGASTRODUODENOSCOPY: SHX5428

## 2017-06-14 HISTORY — PX: LAPAROSCOPY: SHX197

## 2017-06-14 SURGERY — LAPAROSCOPY, DIAGNOSTIC
Anesthesia: General | Site: Esophagus

## 2017-06-14 MED ORDER — BUPIVACAINE-EPINEPHRINE (PF) 0.5% -1:200000 IJ SOLN
INTRAMUSCULAR | Status: AC
  Filled 2017-06-14: qty 30

## 2017-06-14 MED ORDER — HYDROMORPHONE HCL 1 MG/ML IJ SOLN
INTRAMUSCULAR | Status: AC
  Administered 2017-06-14: 0.25 mg via INTRAVENOUS
  Filled 2017-06-14: qty 1

## 2017-06-14 MED ORDER — HYDROMORPHONE HCL 1 MG/ML IJ SOLN
0.2500 mg | INTRAMUSCULAR | Status: DC | PRN
  Administered 2017-06-14 (×4): 0.25 mg via INTRAVENOUS

## 2017-06-14 MED ORDER — BUPIVACAINE HCL (PF) 0.25 % IJ SOLN
INTRAMUSCULAR | Status: AC
  Filled 2017-06-14: qty 30

## 2017-06-14 MED ORDER — SUCCINYLCHOLINE CHLORIDE 200 MG/10ML IV SOSY
PREFILLED_SYRINGE | INTRAVENOUS | Status: AC
  Filled 2017-06-14: qty 10

## 2017-06-14 MED ORDER — LACTATED RINGERS IR SOLN
Status: DC | PRN
  Administered 2017-06-14: 1000 mL

## 2017-06-14 MED ORDER — CHLORHEXIDINE GLUCONATE CLOTH 2 % EX PADS: 6.0000 | MEDICATED_PAD | Freq: Once | CUTANEOUS | Status: DC

## 2017-06-14 MED ORDER — LACTATED RINGERS IV SOLN
INTRAVENOUS | Status: DC
  Administered 2017-06-14 (×2): via INTRAVENOUS

## 2017-06-14 MED ORDER — PROMETHAZINE HCL 25 MG/ML IJ SOLN
6.2500 mg | INTRAMUSCULAR | Status: DC | PRN
  Administered 2017-06-14: 6.25 mg via INTRAVENOUS

## 2017-06-14 MED ORDER — LIDOCAINE 2% (20 MG/ML) 5 ML SYRINGE
INTRAMUSCULAR | Status: AC
  Filled 2017-06-14: qty 5

## 2017-06-14 MED ORDER — KETAMINE HCL 10 MG/ML IJ SOLN
INTRAMUSCULAR | Status: DC | PRN
  Administered 2017-06-14: 25 mg via INTRAVENOUS

## 2017-06-14 MED ORDER — CIPROFLOXACIN IN D5W 400 MG/200ML IV SOLN
400.0000 mg | INTRAVENOUS | Status: AC
  Administered 2017-06-14: 400 mg via INTRAVENOUS
  Filled 2017-06-14: qty 200

## 2017-06-14 MED ORDER — 0.9 % SODIUM CHLORIDE (POUR BTL) OPTIME
TOPICAL | Status: DC | PRN
  Administered 2017-06-14: 1000 mL

## 2017-06-14 MED ORDER — LIDOCAINE 2% (20 MG/ML) 5 ML SYRINGE
INTRAMUSCULAR | Status: DC | PRN
  Administered 2017-06-14: 60 mg via INTRAVENOUS

## 2017-06-14 MED ORDER — PROPOFOL 10 MG/ML IV BOLUS
INTRAVENOUS | Status: DC | PRN
  Administered 2017-06-14: 150 mg via INTRAVENOUS

## 2017-06-14 MED ORDER — DEXAMETHASONE SODIUM PHOSPHATE 10 MG/ML IJ SOLN
INTRAMUSCULAR | Status: AC
  Filled 2017-06-14: qty 1

## 2017-06-14 MED ORDER — SUGAMMADEX SODIUM 200 MG/2ML IV SOLN
INTRAVENOUS | Status: DC | PRN
  Administered 2017-06-14: 200 mg via INTRAVENOUS

## 2017-06-14 MED ORDER — MIDAZOLAM HCL 2 MG/2ML IJ SOLN
INTRAMUSCULAR | Status: AC
  Filled 2017-06-14: qty 2

## 2017-06-14 MED ORDER — SCOPOLAMINE 1 MG/3DAYS TD PT72
MEDICATED_PATCH | TRANSDERMAL | Status: AC
  Filled 2017-06-14: qty 1

## 2017-06-14 MED ORDER — PROMETHAZINE HCL 25 MG/ML IJ SOLN
6.2500 mg | Freq: Once | INTRAMUSCULAR | Status: AC
  Administered 2017-06-14: 6.25 mg via INTRAVENOUS

## 2017-06-14 MED ORDER — ACETAMINOPHEN 500 MG PO TABS
1000.0000 mg | ORAL_TABLET | ORAL | Status: AC
  Administered 2017-06-14: 1000 mg via ORAL
  Filled 2017-06-14: qty 2

## 2017-06-14 MED ORDER — MIDAZOLAM HCL 5 MG/5ML IJ SOLN
INTRAMUSCULAR | Status: DC | PRN
  Administered 2017-06-14: 2 mg via INTRAVENOUS

## 2017-06-14 MED ORDER — DEXAMETHASONE SODIUM PHOSPHATE 10 MG/ML IJ SOLN
INTRAMUSCULAR | Status: DC | PRN
  Administered 2017-06-14: 5 mg via INTRAVENOUS

## 2017-06-14 MED ORDER — KETOROLAC TROMETHAMINE 30 MG/ML IJ SOLN: 30.0000 mg | Freq: Once | INTRAMUSCULAR | Status: DC

## 2017-06-14 MED ORDER — SCOPOLAMINE 1 MG/3DAYS TD PT72
MEDICATED_PATCH | TRANSDERMAL | Status: DC | PRN
  Administered 2017-06-14: 1 via TRANSDERMAL

## 2017-06-14 MED ORDER — ONDANSETRON HCL 4 MG/2ML IJ SOLN
INTRAMUSCULAR | Status: AC
  Filled 2017-06-14: qty 2

## 2017-06-14 MED ORDER — HYDROCODONE-ACETAMINOPHEN 5-325 MG PO TABS: 1.0000 | ORAL_TABLET | ORAL | 0 refills | Status: AC | PRN

## 2017-06-14 MED ORDER — KETOROLAC TROMETHAMINE 30 MG/ML IJ SOLN
INTRAMUSCULAR | Status: AC
  Filled 2017-06-14: qty 1

## 2017-06-14 MED ORDER — FENTANYL CITRATE (PF) 250 MCG/5ML IJ SOLN
INTRAMUSCULAR | Status: DC | PRN
  Administered 2017-06-14 (×5): 50 ug via INTRAVENOUS

## 2017-06-14 MED ORDER — BUPIVACAINE LIPOSOME 1.3 % IJ SUSP
20.0000 mL | Freq: Once | INTRAMUSCULAR | Status: AC
  Administered 2017-06-14: 20 mL
  Filled 2017-06-14: qty 20

## 2017-06-14 MED ORDER — SODIUM CHLORIDE 0.9 % IJ SOLN
INTRAMUSCULAR | Status: DC | PRN
  Administered 2017-06-14: 50 mL

## 2017-06-14 MED ORDER — PROMETHAZINE HCL 25 MG/ML IJ SOLN
INTRAMUSCULAR | Status: AC
  Administered 2017-06-14: 6.25 mg via INTRAVENOUS
  Filled 2017-06-14: qty 1

## 2017-06-14 MED ORDER — FENTANYL CITRATE (PF) 250 MCG/5ML IJ SOLN
INTRAMUSCULAR | Status: AC
  Filled 2017-06-14: qty 5

## 2017-06-14 MED ORDER — BUPIVACAINE HCL (PF) 0.25 % IJ SOLN
INTRAMUSCULAR | Status: DC | PRN
  Administered 2017-06-14: 20 mL

## 2017-06-14 MED ORDER — ROCURONIUM BROMIDE 50 MG/5ML IV SOSY
PREFILLED_SYRINGE | INTRAVENOUS | Status: DC | PRN
  Administered 2017-06-14: 35 mg via INTRAVENOUS
  Administered 2017-06-14: 10 mg via INTRAVENOUS

## 2017-06-14 MED ORDER — GABAPENTIN 300 MG PO CAPS
300.0000 mg | ORAL_CAPSULE | ORAL | Status: AC
  Administered 2017-06-14: 300 mg via ORAL
  Filled 2017-06-14: qty 1

## 2017-06-14 MED ORDER — SODIUM CHLORIDE 0.9 % IJ SOLN
INTRAMUSCULAR | Status: AC
  Filled 2017-06-14: qty 50

## 2017-06-14 MED ORDER — SUCCINYLCHOLINE CHLORIDE 200 MG/10ML IV SOSY
PREFILLED_SYRINGE | INTRAVENOUS | Status: DC | PRN
  Administered 2017-06-14: 100 mg via INTRAVENOUS

## 2017-06-14 MED ORDER — ROCURONIUM BROMIDE 10 MG/ML (PF) SYRINGE
PREFILLED_SYRINGE | INTRAVENOUS | Status: AC
  Filled 2017-06-14: qty 5

## 2017-06-14 SURGICAL SUPPLY — 31 items
BENZOIN TINCTURE PRP APPL 2/3 (GAUZE/BANDAGES/DRESSINGS) IMPLANT
CLOSURE WOUND 1/2 X4 (GAUZE/BANDAGES/DRESSINGS)
COVER SURGICAL LIGHT HANDLE (MISCELLANEOUS) ×4 IMPLANT
DECANTER SPIKE VIAL GLASS SM (MISCELLANEOUS) IMPLANT
DERMABOND ADVANCED (GAUZE/BANDAGES/DRESSINGS) ×2
DERMABOND ADVANCED .7 DNX12 (GAUZE/BANDAGES/DRESSINGS) ×2 IMPLANT
ELECT REM PT RETURN 15FT ADLT (MISCELLANEOUS) ×4 IMPLANT
GLOVE BIOGEL PI IND STRL 6.5 (GLOVE) ×6 IMPLANT
GLOVE BIOGEL PI IND STRL 7.0 (GLOVE) ×4 IMPLANT
GLOVE BIOGEL PI INDICATOR 6.5 (GLOVE) ×6
GLOVE BIOGEL PI INDICATOR 7.0 (GLOVE) ×4
GLOVE SURG SIGNA 7.5 PF LTX (GLOVE) ×4 IMPLANT
GLOVE SURG SS PI 7.0 STRL IVOR (GLOVE) ×12 IMPLANT
GOWN STRL REUS W/TWL LRG LVL3 (GOWN DISPOSABLE) ×4 IMPLANT
GOWN STRL REUS W/TWL XL LVL3 (GOWN DISPOSABLE) ×20 IMPLANT
IRRIG SUCT STRYKERFLOW 2 WTIP (MISCELLANEOUS)
IRRIGATION SUCT STRKRFLW 2 WTP (MISCELLANEOUS) IMPLANT
KIT BASIN OR (CUSTOM PROCEDURE TRAY) ×4 IMPLANT
SHEARS HARMONIC ACE PLUS 36CM (ENDOMECHANICALS) IMPLANT
SLEEVE ADV FIXATION 5X100MM (TROCAR) ×4 IMPLANT
SOLUTION ANTI FOG 6CC (MISCELLANEOUS) ×4 IMPLANT
STRIP CLOSURE SKIN 1/2X4 (GAUZE/BANDAGES/DRESSINGS) IMPLANT
SUT VIC AB 4-0 PS2 27 (SUTURE) IMPLANT
TOWEL OR 17X26 10 PK STRL BLUE (TOWEL DISPOSABLE) ×4 IMPLANT
TRAY FOLEY W/METER SILVER 16FR (SET/KITS/TRAYS/PACK) IMPLANT
TRAY LAPAROSCOPIC (CUSTOM PROCEDURE TRAY) ×4 IMPLANT
TROCAR BLADELESS OPT 5 100 (ENDOMECHANICALS) ×4 IMPLANT
TROCAR XCEL BLUNT TIP 100MML (ENDOMECHANICALS) ×4 IMPLANT
TROCAR XCEL NON-BLD 11X100MML (ENDOMECHANICALS) IMPLANT
TROCAR XCEL UNIV SLVE 11M 100M (ENDOMECHANICALS) IMPLANT
TUBING INSUF HEATED (TUBING) ×4 IMPLANT

## 2017-06-14 NOTE — H&P (Signed)
History of Present Illness Marland Kitchen T. Kristy Catoe MD; 06/04/2017 3:14 PM) The patient is a 42 year old female presenting status-post bariatric surgery. 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 She had persistent and worsening vomiting and reflux without a clear anatomic cause. Since revision of her reflux and heartburn has been relieved. She was however admitted with persistent right upper quadrant abdominal pain and underwent a laparoscopic cholecystectomy for presumed biliary dyskinesia on February 23, 2017. This was done by Dr. gross. He carefully examined all of her small bowel at that time and there was no hernia or bowel abnormalities. She did pretty well following this for a couple of weeks but about 6 weeks ago developed the onset of left lower quadrant abdominal pain. She describes burning pain localized in her left lower quadrant. This is worse particularly with any motion or stretching of her abdomen or getting up and down. Also persistent diarrhea 6 or 7 times per day and nausea, vomiting once or twice per day. She was evaluated in our office on 03/16/2017. Some left lower quadrant tenderness was noted. Lab work was obtained showing a normal CBC with white count of 7000 and normal LFTs. CT scan was obtained on that date which I have personally reviewed. The only potential abnormality is some slight increased enhancement of some normal sized fluid-filled bowel loops in the left lower quadrant possibly representing a mild enteritis. No obstruction. She since has had GI evaluation with Dr. Paulita Fujita. All stool studies were negative for any infectious enteritis. She was placed on anti-spasmodics which have not helped. Placed on Lomotil as well which has not helped. She has not really gotten any worse but she has not gotten any better. Persistent  symptoms of left lower quadrant pain, diarrhea, nausea. Says she is vomiting about once a day. Also developing some epigastric pain more after solids and liquids. She feels like she is occasionally getting lightheaded and weak. On questioning she is keeping fluids down pretty well. Her weight is only down a couple of pounds since I saw her several weeks ago. She has had normal lab work on a couple of occasions. She went to the emergency department several weeks ago for worsening pain. CT scan again was negative as well as lab work and she was treated symptomatically and discharged home.   Problem List/Past Medical Marland Kitchen T. 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 Shaqueta Casady, MD; 06/04/2017 3:15 PM) Hysterectomy (due to cancer) - Partial  Oral Surgery   Diagnostic Studies History Marland Kitchen T. Marjean Imperato, MD; 06/04/2017 3:15 PM) Colonoscopy  never Mammogram  >3 years ago Pap Smear  1-5 years ago  Allergies Sabino Gasser; 06/04/2017 2:52 PM) Penicillin G Pot in Dextrose *PENICILLINS*  Ceclor *CEPHALOSPORINS*  Allergies Reconciled   Medication History Sabino Gasser; 06/04/2017 2:52 PM) Protonix (40MG  Tablet DR, 1 (one) Oral daily, Taken starting 11/20/2016) Active. Amitriptyline HCl (25MG  Tablet, 1 (one) Oral at bedtime, Taken starting 04/06/2017) Active. DiazePAM (5MG  Tablet, Oral) Active. Multi-Vitamin (Oral) Active. Bariatric Multivitamins/Iron (Oral) Active. Lexapro (20MG  Tablet, Oral daily) Active. Medications Reconciled  Social History Marland Kitchen T. Kensy Blizard, MD; 06/04/2017 3:15 PM) Tobacco use  Never  smoker. Alcohol use  Occasional alcohol use. Caffeine use  Carbonated beverages, Tea. No drug use   Family History Marland Kitchen T. Tabor Denham, MD; 06/04/2017 3:15 PM) Arthritis  Father, Mother. Breast Cancer  Family Members In General. Kidney Disease  Daughter. Diabetes Mellitus  Family Members In General, Father. Heart disease in female family member before age 76  Hypertension  Family Members In General, Father, Mother.  Pregnancy / Birth History Marland Kitchen T. Trust Crago, MD; 06/04/2017 3:15 PM) Age at menarche  21 years. Gravida  4 Irregular periods  Maternal age  85-25 Para  103  Other Problems Marland Kitchen T. Pamila Mendibles, MD; 06/04/2017 3:15 PM) Gastroesophageal Reflux Disease  Anxiety Disorder  Back Pain   Vitals Sabino Gasser; 06/04/2017 2:52 PM) 06/04/2017 2:52 PM Weight: 204 lb Height: 64.5in Body Surface Area: 1.98 m Body Mass Index: 34.48 kg/m  Temp.: 98.27F(Oral)  Pulse: 93 (Regular)  BP: 118/84 (Sitting, Left Arm, Standard)       Physical Exam Marland Kitchen T. Kimble Hitchens MD; 06/04/2017 3:16 PM) The physical exam findings are as follows: Note:General: Alert, well-developed and well nourished Caucasian female, appears mildly uncomfortable. Skin: Warm and dry without rash or infection. HEENT: No palpable masses or thyromegaly. Sclera nonicteric. Lungs: Breath sounds clear and equal. No wheezing or increased work of breathing. Cardiovascular: Regular rate and rhythm without murmer. No JVD or edema. Peripheral pulses intact. No carotid bruits. Abdomen: Nondistended. There is diffuse tenderness to fairly light palpation but particularly in the left lower quadrant and epigastrium. No masses palpable. No organomegaly. No palpable hernias. Extremities: No edema or joint swelling or deformity. No chronic venous stasis changes. Neurologic: Alert and fully oriented. Gait normal. No focal weakness. Psychiatric: Normal mood and affect. Thought content appropriate  with normal judgement and insight    Assessment & Plan Marland Kitchen T. 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 Status post laparoscopic cholecystectomy for biliary dyskinesia and careful examination of her bowel for episode of right upper quadrant abdominal pain persistent first of this year, one month ago. Now approximately 3 months of persistent severe left lower quadrant pain with diarrhea and nausea and vomiting. Lab work was unremarkable. CT scan showed no marked abnormalities or obstruction but did show some mild increased enhancement of some small bowel loops in the left lower quadrant. However 2 subsequent CT scans entirely negative. It appeared she had some sort of enteritis, possibly bacterial or viral. Possible C. difficile infection. However all stool studies were negative. She since has had repeat stool studies and GI evaluation that was negative She has not gotten worse but not getting better. Empiric course of Cipro was not helpful. I'm still not sure of the origin of her symptoms but she feels they are intolerable. Dr. Paulita Fujita felt that her symptoms may well be functional. I discussed all this with the patient. She still feels her current situation is intolerable. The only thing else I could offer in terms of diagnosis would be to proceed with diagnostic laparoscopy which we discussed is generally indicated in her post-bypass patients with unexplained abdominal pain to rule out internal hernia. I discussed that I don't think her symptom complex generally is consistent with internal hernia. However I don't think it is unreasonable to proceed with diagnostic laparoscopy and I would also plan upper endoscopy under the same anesthesia. We  will try to switch her to Col[place] to see if this helps with her diarrhea which may be  postcholecystectomy. Current Plans Schedule for Surgery  Diagnostic laparoscopy and upper endoscopy under general anesthesia as an outpatient

## 2017-06-14 NOTE — Transfer of Care (Signed)
Immediate Anesthesia Transfer of Care Note  Patient: ALISA STJAMES  Procedure(s) Performed: LAPAROSCOPY DIAGNOSTIC ERAS PATHWAY (N/A Abdomen) ESOPHAGOGASTRODUODENOSCOPY (EGD) (N/A Esophagus)  Patient Location: PACU  Anesthesia Type:General  Level of Consciousness: drowsy and patient cooperative  Airway & Oxygen Therapy: Patient Spontanous Breathing and Patient connected to face mask oxygen  Post-op Assessment: Report given to RN and Post -op Vital signs reviewed and stable  Post vital signs: Reviewed and stable  Last Vitals:  Vitals Value Taken Time  BP 133/79 06/14/2017  3:38 PM  Temp 36.9 C 06/14/2017  3:38 PM  Pulse 72 06/14/2017  3:41 PM  Resp 15 06/14/2017  3:41 PM  SpO2 100 % 06/14/2017  3:41 PM  Vitals shown include unvalidated device data.  Last Pain:  Vitals:   06/14/17 1238  TempSrc:   PainSc: 4       Patients Stated Pain Goal: 4 (59/16/38 4665)  Complications: No apparent anesthesia complications

## 2017-06-14 NOTE — Anesthesia Procedure Notes (Signed)
Procedure Name: Intubation Date/Time: 06/14/2017 2:17 PM Performed by: Maxwell Caul, CRNA Pre-anesthesia Checklist: Patient identified, Emergency Drugs available, Suction available and Patient being monitored Patient Re-evaluated:Patient Re-evaluated prior to induction Oxygen Delivery Method: Circle system utilized Preoxygenation: Pre-oxygenation with 100% oxygen Induction Type: IV induction, Rapid sequence and Cricoid Pressure applied Laryngoscope Size: Mac and 4 Grade View: Grade I Tube type: Oral Tube size: 7.5 mm Number of attempts: 1 Airway Equipment and Method: Stylet Placement Confirmation: ETT inserted through vocal cords under direct vision,  positive ETCO2 and breath sounds checked- equal and bilateral Tube secured with: Tape Dental Injury: Teeth and Oropharynx as per pre-operative assessment

## 2017-06-14 NOTE — Op Note (Signed)
Name:  [name] MRN: [id number] Date of Surgery: 06/14/2017  Preop Diagnosis:  S/P RYGB, recurrent abdominal symptoms  Postop Diagnosis:  S/P RYGB (Weight - 204, BMI - 34.4),  Normal post op anatomy, mild bile reflux into the esophagus  Procedure:  Upper endoscopy  (Intraoperative)  Surgeon:  Alphonsa Overall, M.D.  Anesthesia:  GET  Indications for procedure: Mary Williams is a 42 y.o. female whose primary care physician is Darrol Jump, [place]. She originally had a sleeve gastrectomy on 11/12/2015 for morbid obesity by Dr. Excell Seltzer.  She had a sleeve gastrectomy converted to a Roux-en-Y gastric bypass by Dr. Excell Seltzer on 12/29/2016 because of chronic reflux and postprandial nausea and vomiting.  But she has continued to have gastrointestinal symptoms of LLQ pain and loose stools.  She has been evaluated by Dr. Paulita Fujita.   Dr. Excell Seltzer has completed a laparoscopic exploration.  He did find some adhesions of the jejuno-jejunostomy to the left colon.    I am doing an intraoperative upper endoscopy to evaluate the gastric pouch and the gastro-jejunal anastomosis.  Operative Note: The patient is under general anesthesia.  Dr. Excell Seltzer is laparoscoping the patient while I do an upper endoscopy to evaluate the stomach pouch and gastrojejunal anatomy.  With the patient intubated, I passed the Pentax endoscope without difficulty down the esophagus.  The patient did have bile stained fluid in the esophagus and gastric pouch.    The esophago-gastric junction was at 38 cm.  The gastro-jejunal anastomosis was at 42 cm and is wide open.  The mucosa of the stomach looks good..  The gastro-jejunal anastomosis looks good and there is not evidence of an ulcer.  While I insufflated the stomach pouch with air, Dr. Excell Seltzer clamped off the efferent limb of the jejunum.  I was able to advance the scope about 10 cm down both the efferent and afferent jejunal limb.  These were normal.  The scope was then  withdrawn.  The esophagus was unremarkable and the patient tolerated the endoscopy without difficulty.  Alphonsa Overall, MD, [facility] Surgery Pager: [phone] Office phone:  [phone]

## 2017-06-14 NOTE — Progress Notes (Signed)
Patient's consent form from PST did not have witness signature.  Pt verified that the signature on the form was her own and that she still agreed with procedure on the consent form.  Her and I both initialed on consent form to verify.

## 2017-06-14 NOTE — Anesthesia Postprocedure Evaluation (Signed)
Anesthesia Post Note  Patient: Mary Williams  Procedure(s) Performed: LAPAROSCOPY DIAGNOSTIC ERAS PATHWAY (N/A Abdomen) ESOPHAGOGASTRODUODENOSCOPY (EGD) (N/A Esophagus)     Patient location during evaluation: PACU Anesthesia Type: General Level of consciousness: awake and alert Pain management: pain level controlled Vital Signs Assessment: post-procedure vital signs reviewed and stable Respiratory status: spontaneous breathing, nonlabored ventilation and respiratory function stable Cardiovascular status: blood pressure returned to baseline and stable Postop Assessment: no apparent nausea or vomiting Anesthetic complications: no    Last Vitals:  Vitals:   06/14/17 1700 06/14/17 1711  BP: (!) 105/58 120/79  Pulse: 65 61  Resp: 14 16  Temp: 36.8 C 36.8 C  SpO2: 95% 98%    Last Pain:  Vitals:   06/14/17 1711  TempSrc:   PainSc: Asleep                 Jenna Ardoin,W. EDMOND

## 2017-06-14 NOTE — Interval H&P Note (Signed)
History and Physical Interval Note:  06/14/2017 2:07 PM  Mary Williams  has presented today for surgery, with the diagnosis of abdominal pain post gastric bypass  The various methods of treatment have been discussed with the patient and family. After consideration of risks, benefits and other options for treatment, the patient has consented to  Procedure(s): [place] (N/A) ESOPHAGOGASTRODUODENOSCOPY (EGD) (N/A) as a surgical intervention .  The patient's history has been reviewed, patient examined, no change in status, stable for surgery.  I have reviewed the patient's chart and labs.  Questions were answered to the patient's satisfaction.     Darene Lamer Jeryl Umholtz

## 2017-06-14 NOTE — Op Note (Signed)
Preoperative Diagnosis: abdominal pain post gastric bypass  Postoprative Diagnosis: abdominal pain post gastric bypass  Procedure: Procedure(s): LAPAROSCOPY DIAGNOSTIC ERAS PATHWAY ESOPHAGOGASTRODUODENOSCOPY (EGD)   Surgeon: Excell Seltzer T   Assistants: Alphonsa Overall  Anesthesia:  General endotracheal anesthesia  Indications: Patient is a 42 year old female approximately 1-1/2 years post sleeve gastrectomy for morbid obesity.  She developed intractable reflux and was converted to Roux-en-Y gastric bypass approximately 6 months ago.  She subsequently developed upper abdominal pain consistent with biliary tract pain with negative workup and has undergone cholecystectomy several months ago.  Since soon after this procedure she has had symptoms of nausea and diarrhea postprandial with burning pain initially lower abdomen and now some upper abdomen.  She has had an extensive negative workup.  One initial CT scan showed some fluid-filled loops of jejunum in the left lower quadrant with some possible wall enhancement but subsequent CT is negative as well as complete GI workup and stool studies.  Due to persistent ongoing symptoms after discussion in the office we have elected to proceed with diagnostic laparoscopy and upper endoscopy to attempt to rule out any correctable diagnoses    Procedure Detail: Patient was brought to the operating room, placed in the supine position on the operating table, and general endotracheal anesthesia induced.  The abdomen was widely sterilely prepped and draped.  She received preoperative IV antibiotics.  PAS were in place.  Patient timeout was performed and correct procedure verified.  Access was obtained with a 5 mm Optiview trocar in the left upper quadrant without difficulty and pneumoperitoneum established.  There was no evidence of bleeding or injury.  Under direct vision 5 mm trochars were placed in the lateral right upper abdomen in the right mid abdomen.  I  began running the small bowel from the terminal ileum proximally.  Visualization was somewhat difficult from the left upper quadrant trocar and I placed under direct vision a 5 mm trocar in the left lower quadrant with much better camera angle.  There was a tiny bit of clear ascites probably physiologic.  No significant adhesions initially seen.  Bowel loops appeared overall grossly normal without inflammation or significant dilatation.  The ileocecal valve was identified.  The appendiceal stump was unremarkable.  I then traced the terminal ileum proximal up to the jejunojejunostomy.  A few of the loops of small bowel are possibly minimally dilated but not really remarkable.  The jejunojejunostomy was somewhat tethered posteriorly to an adhesion to appendix epiploica of the left colon.  This adhesion was sharply lysed allowing the jejunostomy to be elevated and become much more mobile.  We carefully inspected the mesentery and the mesentery defect was closed previously and no evidence of internal hernia.  The jejunojejunostomy itself was unremarkable and appeared widely patent.  We then traced the biliopancreatic limb back to the ligament of Treitz which was fluid-filled and possibly minimally dilated but again not clearly pathologic.  The Roux limb was then traced proximally up to the stomach.  It measured about 70 cm at this point.  The gastrojejunostomy could be visualized without adhesions or abnormalities identified.  The only potential abnormality identified was the adhesions to the jejunojejunostomy which Dr. Lucia Gaskins I theorized could potentially have been causing some partial obstruction at the [facility].  Dr. Lucia Gaskins performed upper endoscopy which he will dictate separately but essentially showed no abnormalities although there was some bilious fluid in the gastric pouch and in the Roux limb again which possibly could argue to a possible obstruction  at the [place] from adhesions.  As noted again the anastomosis itself  was widely patent once the adhesions were lysed.  The abdomen was inspected for hemostasis and there was no bleeding or evidence of injury or other problems.  A bilateral T AP block was performed with Exparel L.  Trocar sites were infiltrated with Exparel L and all CO2 evacuated and trochars removed.  Skin incisions were closed with some particular Monocryl and Dermabond.  Sponge needle and instrument counts were correct.   Findings: As above  Estimated Blood Loss:  Minimal         Drains: None  Blood Given: none          Specimens: None        Complications:  * No complications entered in OR log *         Disposition: PACU - hemodynamically stable.         Condition: stable

## 2017-06-14 NOTE — Anesthesia Preprocedure Evaluation (Addendum)
Anesthesia Evaluation  Patient identified by MRN, date of birth, ID band Patient awake    Reviewed: Allergy & Precautions, H&P , NPO status , Patient's Chart, lab work & pertinent test results  Airway Mallampati: I  TM Distance: >3 FB Neck ROM: Full    Dental no notable dental hx. (+) Teeth Intact, Dental Advisory Given   Pulmonary neg pulmonary ROS,    Pulmonary exam normal breath sounds clear to auscultation       Cardiovascular negative cardio ROS   Rhythm:Regular Rate:Normal     Neuro/Psych Anxiety negative neurological ROS     GI/Hepatic Neg liver ROS, hiatal hernia, GERD  Medicated,  Endo/Other  negative endocrine ROS  Renal/GU negative Renal ROS  negative genitourinary   Musculoskeletal   Abdominal   Peds  Hematology negative hematology ROS (+)   Anesthesia Other Findings   Reproductive/Obstetrics negative OB ROS                            Anesthesia Physical Anesthesia Plan  ASA: II  Anesthesia Plan: General   Post-op Pain Management:    Induction: Intravenous  PONV Risk Score and Plan: 4 or greater and Ondansetron, Dexamethasone and Midazolam  Airway Management Planned: Oral ETT  Additional Equipment:   Intra-op Plan:   Post-operative Plan: Extubation in OR  Informed Consent: I have reviewed the patients History and Physical, chart, labs and discussed the procedure including the risks, benefits and alternatives for the proposed anesthesia with the patient or authorized representative who has indicated his/her understanding and acceptance.   Dental advisory given  Plan Discussed with: CRNA  Anesthesia Plan Comments:         Anesthesia Quick Evaluation

## 2017-06-14 NOTE — Discharge Instructions (Signed)
[facility] ______[facility] °LAPAROSCOPIC SURGERY: POST OP INSTRUCTIONS °Always review your discharge instruction sheet given to you by the facility where your surgery was performed. °IF YOU HAVE DISABILITY OR FAMILY LEAVE FORMS, YOU MUST BRING THEM TO THE OFFICE FOR PROCESSING.   °DO NOT GIVE THEM TO YOUR DOCTOR. ° °1. A prescription for pain medication may be given to you upon discharge.  Take your pain medication as prescribed, if needed.  If narcotic pain medicine is not needed, then you may take acetaminophen (Tylenol) or ibuprofen (Advil) as needed. °2. Take your usually prescribed medications unless otherwise directed. °3. If you need a refill on your pain medication, please contact your pharmacy.  They will contact our office to request authorization. Prescriptions will not be filled after 5pm or on week-ends. °4. You should follow a light diet the first few days after arrival home, such as soup and crackers, etc.  Be sure to include lots of fluids daily. °5. Most patients will experience some swelling and bruising in the area of the incisions.  Ice packs will help.  Swelling and bruising can take several days to resolve.  °6. It is common to experience some constipation if taking pain medication after surgery.  Increasing fluid intake and taking a stool softener (such as Colace) will usually help or prevent this problem from occurring.  A mild laxative (Milk of Magnesia or Miralax) should be taken according to package instructions if there are no bowel movements after 48 hours. °7. Unless discharge instructions indicate otherwise, you may remove your bandages 24-48 hours after surgery, and you may shower at that time.  You may have steri-strips (small skin tapes) in place directly over the incision.  These strips should be left on the skin for 7-10 days.  If your surgeon used skin glue on the incision, you may shower in 24 hours.  The glue will flake off over the next 2-3 weeks.  Any sutures or  staples will be removed at the office during your follow-up visit. °8. ACTIVITIES:  You may resume regular (light) daily activities beginning the next day--such as daily self-care, walking, climbing stairs--gradually increasing activities as tolerated.  You may have sexual intercourse when it is comfortable.  Refrain from any heavy lifting or straining until approved by your doctor. °a. You may drive when you are no longer taking prescription pain medication, you can comfortably wear a seatbelt, and you can safely maneuver your car and apply brakes. °b. RETURN TO WORK:  __________________________________________________________ °9. You should see your doctor in the office for a follow-up appointment approximately 2-3 weeks after your surgery.  Make sure that you call for this appointment within a day or two after you arrive home to insure a convenient appointment time. °10. OTHER INSTRUCTIONS: __________________________________________________________________________________________________________________________ __________________________________________________________________________________________________________________________ °WHEN TO CALL YOUR DOCTOR: °1. Fever over 101.0 °2. Inability to urinate °3. Continued bleeding from incision. °4. Increased pain, redness, or drainage from the incision. °5. Increasing abdominal pain ° °The clinic staff is available to answer your questions during regular business hours.  Please don’t hesitate to call and ask to speak to one of the nurses for clinical concerns.  If you have a medical emergency, go to the nearest emergency room or call 911.  A surgeon from [facility] is always on call at the hospital. °[address], [address], [place], [place]  [address] ? [address] [address], [place], [place]   [address] °[phone] ? [phone] ? FAX [phone] °Web site:   [email or web address] °

## 2017-06-15 ENCOUNTER — Encounter (HOSPITAL_COMMUNITY): Payer: Self-pay | Admitting: General Surgery

## 2017-08-04 ENCOUNTER — Encounter: Payer: Self-pay | Admitting: Medical

## 2017-10-01 ENCOUNTER — Encounter (HOSPITAL_COMMUNITY): Payer: Self-pay | Admitting: Emergency Medicine

## 2017-10-01 ENCOUNTER — Emergency Department (HOSPITAL_COMMUNITY): Payer: Medicaid Other

## 2017-10-01 ENCOUNTER — Inpatient Hospital Stay (HOSPITAL_COMMUNITY)
Admission: EM | Admit: 2017-10-01 | Discharge: 2017-10-06 | DRG: 392 | Disposition: A | Discharge status: Home / Self Care | Payer: Medicaid Other | Attending: General Surgery | Admitting: General Surgery

## 2017-10-01 ENCOUNTER — Other Ambulatory Visit: Payer: Self-pay

## 2017-10-01 DIAGNOSIS — R1013 Epigastric pain: Principal | ICD-10-CM | POA: Diagnosis present

## 2017-10-01 DIAGNOSIS — R109 Unspecified abdominal pain: Secondary | ICD-10-CM | POA: Diagnosis present

## 2017-10-01 DIAGNOSIS — R112 Nausea with vomiting, unspecified: Secondary | ICD-10-CM | POA: Diagnosis present

## 2017-10-01 DIAGNOSIS — Z9884 Bariatric surgery status: Secondary | ICD-10-CM

## 2017-10-01 DIAGNOSIS — Z90710 Acquired absence of both cervix and uterus: Secondary | ICD-10-CM

## 2017-10-01 DIAGNOSIS — Z9049 Acquired absence of other specified parts of digestive tract: Secondary | ICD-10-CM

## 2017-10-01 DIAGNOSIS — R197 Diarrhea, unspecified: Secondary | ICD-10-CM | POA: Diagnosis present

## 2017-10-01 LAB — URINALYSIS, ROUTINE W REFLEX MICROSCOPIC
Bilirubin Urine: NEGATIVE
Glucose, UA: >=500 mg/dL — AB
HGB URINE DIPSTICK: NEGATIVE
Ketones, ur: NEGATIVE mg/dL
Leukocytes, UA: NEGATIVE
NITRITE: NEGATIVE
Protein, ur: NEGATIVE mg/dL
Specific Gravity, Urine: 1.009 (ref 1.005–1.030)
pH: 5 (ref 5.0–8.0)

## 2017-10-01 LAB — COMPREHENSIVE METABOLIC PANEL
ALBUMIN: 3.9 g/dL (ref 3.5–5.0)
ALT: 22 U/L (ref 0–44)
ANION GAP: 7 (ref 5–15)
AST: 29 U/L (ref 15–41)
Alkaline Phosphatase: 56 U/L (ref 38–126)
BUN: 11 mg/dL (ref 6–20)
CO2: 26 mmol/L (ref 22–32)
Calcium: 9 mg/dL (ref 8.9–10.3)
Chloride: 108 mmol/L (ref 98–111)
Creatinine, Ser: 0.92 mg/dL (ref 0.44–1.00)
GFR calc Af Amer: >60 mL/min (ref >60)
GFR calc non Af Amer: >60 mL/min (ref >60)
GLUCOSE: 91 mg/dL (ref 70–99)
POTASSIUM: 4 mmol/L (ref 3.5–5.1)
SODIUM: 141 mmol/L (ref 135–145)
TOTAL PROTEIN: 6.8 g/dL (ref 6.5–8.1)
Total Bilirubin: 0.6 mg/dL (ref 0.3–1.2)

## 2017-10-01 LAB — CBC
HEMATOCRIT: 42.1 % (ref 36.0–46.0)
HEMOGLOBIN: 13.9 g/dL (ref 12.0–15.0)
MCH: 29.9 pg (ref 26.0–34.0)
MCHC: 33 g/dL (ref 30.0–36.0)
MCV: 90.5 fL (ref 78.0–100.0)
Platelets: 294 10*3/uL (ref 150–400)
RBC: 4.65 MIL/uL (ref 3.87–5.11)
RDW: 13.7 % (ref 11.5–15.5)
WBC: 11 10*3/uL — ABNORMAL HIGH (ref 4.0–10.5)

## 2017-10-01 LAB — I-STAT CG4 LACTIC ACID, ED: LACTIC ACID, VENOUS: 1.19 mmol/L (ref 0.5–1.9)

## 2017-10-01 LAB — LIPASE, BLOOD: Lipase: 50 U/L (ref 11–51)

## 2017-10-01 MED ORDER — IOPAMIDOL (ISOVUE-300) INJECTION 61%
100.0000 mL | Freq: Once | INTRAVENOUS | Status: AC | PRN
  Administered 2017-10-01: 100 mL via INTRAVENOUS

## 2017-10-01 MED ORDER — ONDANSETRON 4 MG PO TBDP: 4.0000 mg | ORAL_TABLET | Freq: Once | ORAL | Status: DC | PRN

## 2017-10-01 MED ORDER — SODIUM CHLORIDE 0.9 % IV BOLUS
1000.0000 mL | Freq: Once | INTRAVENOUS | Status: AC
  Administered 2017-10-01: 1000 mL via INTRAVENOUS

## 2017-10-01 MED ORDER — ONDANSETRON HCL 4 MG/2ML IJ SOLN
4.0000 mg | Freq: Once | INTRAMUSCULAR | Status: AC
  Administered 2017-10-01: 4 mg via INTRAVENOUS
  Filled 2017-10-01: qty 2

## 2017-10-01 MED ORDER — LORAZEPAM 2 MG/ML IJ SOLN
1.0000 mg | Freq: Once | INTRAMUSCULAR | Status: AC
  Administered 2017-10-01: 1 mg via INTRAVENOUS
  Filled 2017-10-01: qty 1

## 2017-10-01 MED ORDER — IOPAMIDOL (ISOVUE-300) INJECTION 61%
INTRAVENOUS | Status: AC
  Administered 2017-10-01: 100 mL via INTRAVENOUS
  Filled 2017-10-01: qty 100

## 2017-10-01 MED ORDER — FENTANYL CITRATE (PF) 100 MCG/2ML IJ SOLN
100.0000 ug | INTRAMUSCULAR | Status: DC | PRN
  Administered 2017-10-01 (×2): 100 ug via INTRAVENOUS
  Filled 2017-10-01 (×2): qty 2

## 2017-10-01 NOTE — ED Provider Notes (Signed)
[facility] DEPT Provider Note   CSN: [id number] Arrival date & time: 10/01/17  1923     History   Chief Complaint Chief Complaint  Patient presents with  . Abdominal Pain    HPI Mary Williams is a 42 y.o. female.  HPI   She presents for evaluation of abdominal pain both upper and lower, which started earlier today and is gotten progressively worse.  Eating seem to bother it so she did not eat much today.  She had 2 episodes of vomiting this afternoon, once with blood in the second time not with blood.  She has had normal bowel movements last earlier today.  She denies fever, chills, cough or chest pain.  She had exploratory abdominal surgery with adhesion lysis, about 2 months ago coupled with an endoscopy, simultaneously.  This was done to evaluate ongoing abdominal pain which was not elucidated by multiple evaluations including imaging.  She saw her surgeon for a postoperative follow-up, 1 week after the surgery and did not have any complications.  No pain since surgery.  There are no other known modifying factors.  Past Medical History:  Diagnosis Date  . Anxiety   . Cancer ([place]) yrs ago   mOLAR PREGNACY CANCEROUS REMOVED  . GERD (gastroesophageal reflux disease)   . History of hiatal hernia   . Pneumonia yrs ago    Patient Active Problem List   Diagnosis Date Noted  . Acute acalculous cholecystitis s/p lap cholecystectomy 02/23/2017 02/23/2017  . Right upper quadrant abdominal pain 02/22/2017  . 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 Anxiety   . GERD with esophagitis 12/29/2016  . Abdominal pain 07/27/2016  . Morbid obesity with BMI of 50.0-59.9, adult ([place]) 11/12/2015    Past Surgical History:  Procedure Laterality Date  . ABDOMINAL HYSTERECTOMY     ovaries left  . APPENDECTOMY    . CHOLECYSTECTOMY N/A 02/23/2017   Procedure: LAPAROSCOPIC CHOLECYSTECTOMY;  Surgeon: Michael Boston, MD;   Location: [facility];  Service: General;  Laterality: N/A;  . DILATION AND CURETTAGE OF UTERUS    . ESOPHAGOGASTRODUODENOSCOPY     possible dilation 12/14/16 Dr. Lucia Gaskins  . ESOPHAGOGASTRODUODENOSCOPY N/A 02/23/2017   Procedure: ESOPHAGOGASTRODUODENOSCOPY (EGD);  Surgeon: Michael Boston, MD;  Location: [facility];  Service: General;  Laterality: N/A;  . ESOPHAGOGASTRODUODENOSCOPY N/A 06/14/2017   Procedure: ESOPHAGOGASTRODUODENOSCOPY (EGD);  Surgeon: Excell Seltzer, MD;  Location: [facility];  Service: General;  Laterality: N/A;  . ESOPHAGOGASTRODUODENOSCOPY (EGD) WITH PROPOFOL N/A 12/14/2016   Procedure: ESOPHAGOGASTRODUODENOSCOPY (EGD) WITH PROPOFOL WITH POSSIBLE DILATATION;  Surgeon: Alphonsa Overall, MD;  Location: [facility];  Service: General;  Laterality: N/A;  . HERNIA REPAIR    . LAPAROSCOPIC CHOLECYSTECTOMY SINGLE SITE WITH INTRAOPERATIVE CHOLANGIOGRAM N/A 02/23/2017   Procedure: DIAGNOSTIC LAPAROSCOPY;  Surgeon: Michael Boston, MD;  Location: [facility];  Service: General;  Laterality: N/A;  . LAPAROSCOPIC GASTRIC SLEEVE RESECTION WITH HIATAL HERNIA REPAIR N/A 11/12/2015   Procedure: LAPAROSCOPIC GASTRIC SLEEVE RESECTION WITH HIATAL HERNIA REPAIR WITH UPPER ENDO;  Surgeon: Excell Seltzer, MD;  Location: [facility];  Service: General;  Laterality: N/A;  . LAPAROSCOPIC ROUX-EN-Y GASTRIC BYPASS WITH HIATAL HERNIA REPAIR  12/29/2016   Procedure: LAPAROSCOPIC ROUX-EN-Y GASTRIC BYPASS WITH HIATAL HERNIA REPAIR;  Surgeon: Excell Seltzer, MD;  Location: [facility];  Service: General;;  . LAPAROSCOPY N/A 07/29/2016   Procedure: LAPAROSCOPY DIAGNOSTIC with appendectomy and lysis of adhesions;  Surgeon: Judeth Horn, MD;  Location: [facility];  Service: General;  Laterality: N/A;  . LAPAROSCOPY N/A 06/14/2017   Procedure: LAPAROSCOPY DIAGNOSTIC ERAS PATHWAY;  Surgeon: Excell Seltzer, MD;  Location: [facility];  Service: General;  Laterality: N/A;  . UPPER GI ENDOSCOPY  11/12/2015   Procedure: UPPER GI ENDOSCOPY;  Surgeon:  Excell Seltzer, MD;  Location: [facility];  Service: General;;  . UPPER GI ENDOSCOPY  12/29/2016   Procedure: UPPER GI ENDOSCOPY;  Surgeon: Excell Seltzer, MD;  Location: [facility];  Service: General;;     OB History   None      Home Medications    Prior to Admission medications   Medication Sig Start Date End Date Taking? Authorizing Provider  acetaminophen (TYLENOL) 500 MG tablet Take 1,000 mg by mouth every 8 (eight) hours as needed for mild pain or headache.   Yes [provider]  CLINDAMYCIN HCL PO Take 1 capsule by mouth every 6 (six) hours. Starting 09/23/17 until finished.   Yes [provider]  clonazePAM (KLONOPIN) 0.5 MG tablet Take 0.5 mg by mouth daily as needed for anxiety (panic attacks.).   Yes [provider]  escitalopram (LEXAPRO) 20 MG tablet Take 20 mg by mouth at bedtime.    Yes [provider]  Multiple Vitamins-Minerals (BARIATRIC MULTIVITAMINS/IRON PO) Take 1 capsule by mouth daily.   Yes [provider]  pantoprazole (PROTONIX) 40 MG tablet Take 40 mg by mouth daily after breakfast.  11/06/16  Yes [provider]    Family History Family History  Problem Relation Age of Onset  . Hypertension Other   . Asthma Other   . Cancer Other   . Diabetes Other     Social History Social History   Tobacco Use  . Smoking status: Never Smoker  . Smokeless tobacco: Never Used  Substance Use Topics  . Alcohol use: No    Frequency: Never  . Drug use: No     Allergies   Fish allergy; Penicillins; Shellfish allergy; and Ceclor [cefaclor]   Review of Systems Review of Systems  All other systems reviewed and are negative.    Physical Exam Updated Vital Signs BP (!) 148/88 (BP Location: Right Arm)   Pulse 68   Temp 98.7 F (37.1 C) (Oral)   Resp (!) 21   Ht 5\' 4"  (1.626 m)   Wt 90.7 kg   SpO2 94%   BMI 34.33 kg/m   Physical Exam  Constitutional: She is oriented to person, place, and time. She  appears well-developed and well-nourished. She does not appear ill. She appears distressed (Tearful, crying, upset).  HENT:  Head: Normocephalic and atraumatic.  Eyes: Pupils are equal, round, and reactive to light. Conjunctivae and EOM are normal.  Neck: Normal range of motion and phonation normal. Neck supple.  Cardiovascular: Normal rate and regular rhythm.  Pulmonary/Chest: Effort normal and breath sounds normal. She exhibits no tenderness.  Abdominal: Soft. She exhibits no distension. There is no tenderness. There is no guarding.  Musculoskeletal: Normal range of motion.  Neurological: She is alert and oriented to person, place, and time. She exhibits normal muscle tone.  Skin: Skin is warm and dry.  Psychiatric: She has a normal mood and affect. Her behavior is normal. Judgment and thought content normal.  Nursing note and vitals reviewed.    ED Treatments / Results  Labs (all labs ordered are listed, but only abnormal results are displayed) Labs Reviewed  CBC - Abnormal; Notable for the following components:      Result Value   WBC  11.0 (*)    All other components within normal limits  URINALYSIS, ROUTINE W REFLEX MICROSCOPIC - Abnormal; Notable for the following components:   Glucose, UA >=500 (*)    Bacteria, UA FEW (*)    All other components within normal limits  CULTURE, BLOOD (ROUTINE X 2)  CULTURE, BLOOD (ROUTINE X 2)  LIPASE, BLOOD  COMPREHENSIVE METABOLIC PANEL  I-STAT CG4 LACTIC ACID, ED    EKG EKG Interpretation  Date/Time:  Friday October 01 2017 21:16:00 EDT Ventricular Rate:  103 PR Interval:    QRS Duration: 102 QT Interval:  345 QTC Calculation: 452 R Axis:   69 Text Interpretation:  Sinus tachycardia Artifact Since last tracing rate faster Confirmed by Daleen Bo [phone]) on 10/01/2017 10:32:18 PM   Radiology Ct Abdomen Pelvis W Contrast  Result Date: 10/01/2017 CLINICAL DATA:  Abdominal pain and vomiting today. Status post gastric bypass  previously. History of cholecystectomy and appendectomy. EXAM: CT ABDOMEN AND PELVIS WITH CONTRAST TECHNIQUE: Multidetector CT imaging of the abdomen and pelvis was performed using the standard protocol following bolus administration of intravenous contrast. CONTRAST:  151mL ISOVUE-300 IOPAMIDOL (ISOVUE-300) INJECTION 61% COMPARISON:  05/12/2017. FINDINGS: Lower chest: Unremarkable. Hepatobiliary: No focal liver abnormality is seen. Status post cholecystectomy. No biliary dilatation. Pancreas: Unremarkable. No pancreatic ductal dilatation or surrounding inflammatory changes. Spleen: Normal in size without focal abnormality. Adrenals/Urinary Tract: Adrenal glands are unremarkable. Kidneys are normal, without renal calculi, focal lesion, or hydronephrosis. Bladder is unremarkable. Stomach/Bowel: Post gastric bypass changes. Unremarkable colon and small bowel. Surgically absent appendix. Vascular/Lymphatic: No significant vascular findings are present. No enlarged abdominal or pelvic lymph nodes. Reproductive: Status post hysterectomy. No adnexal masses. Other: No abdominal wall hernia or abnormality. No abdominopelvic ascites. Musculoskeletal: Mild scoliosis. Mild lumbar and lower thoracic spine degenerative changes. Scattered bone islands. Mild lumbar and lower thoracic spine degenerative changes. IMPRESSION: No acute abnormality. Electronically Signed   By: Claudie Revering M.D.   On: 10/01/2017 22:42    Procedures Procedures (including critical care time)  Medications Ordered in ED Medications  ondansetron (ZOFRAN-ODT) disintegrating tablet 4 mg (has no administration in time range)  fentaNYL (SUBLIMAZE) injection 100 mcg (100 mcg Intravenous Given 10/01/17 2214)  sodium chloride 0.9 % bolus 1,000 mL (0 mLs Intravenous Stopped 10/01/17 2220)  ondansetron (ZOFRAN) injection 4 mg (4 mg Intravenous Given 10/01/17 2129)  LORazepam (ATIVAN) injection 1 mg (1 mg Intravenous Given 10/01/17 2129)  iopamidol (ISOVUE-300)  61 % injection 100 mL (100 mLs Intravenous Contrast Given 10/01/17 2225)     Initial Impression / Assessment and Plan / ED Course  I have reviewed the triage vital signs and the nursing notes.  Pertinent labs & imaging results that were available during my care of the patient were reviewed by me and considered in my medical decision making (see chart for details).  Clinical Course as of Oct 01 2299  Fri Oct 01, 2017  2114 Case discussed with her surgeon, Dr. Excell Seltzer who requests call back after imaging and he will come and see the patient.   [EW]  0175 Requested callback from general surgery by text page.   [EW]    Clinical Course User Index [EW] Daleen Bo, MD     Patient Vitals for the past 24 hrs:  BP Temp Temp src Pulse Resp SpO2 Height Weight  10/01/17 2237 (!) 148/88 - - 68 (!) 21 94 % - -  10/01/17 2200 (!) 133/92 - - (!) 106 14 98 % - -  10/01/17  2130 119/85 - - (!) 107 (!) 21 96 % - -  10/01/17 1942 (!) 150/119 98.7 F (37.1 C) Oral (!) 140 (!) 22 98 % - -  10/01/17 1941 - - - - - - 5\' 4"  (1.626 m) 90.7 kg    10:58 PM Reevaluation with update and discussion. After initial assessment and treatment, an updated evaluation reveals she states she is no better after 2 doses of fentanyl.  She states "I just cannot understand what it is."  She continues to have a worried and sad look on her face.  Patient and mail person with her updated on findings and plan. Daleen Bo   Medical Decision Making: Recurrent abdominal pain, status post exploratory laparotomy 2 months ago without significant findings at operative intervention.  Today no acute problems are found on exam.  Screening labs are normal.  CRITICAL CARE- No Performed by: Daleen Bo   Nursing Notes Reviewed/ Care Coordinated Applicable Imaging Reviewed Interpretation of Laboratory Data incorporated into ED treatment  Plan-as per general surgery, after patient evaluated in the emergency  department     Final Clinical Impressions(s) / ED Diagnoses   Final diagnoses:  Abdominal pain, unspecified abdominal location    ED Discharge Orders    None       Daleen Bo, MD 10/01/17 2301

## 2017-10-01 NOTE — H&P (Signed)
Mary Williams is an 42 y.o. female.   Chief Complaint: Abdominal pain  HPI: The patient is a 42 year old female presenting status-post bariatric surgery. 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 She had persistent and worsening vomiting and reflux without a clear anatomic cause. Since revision of her reflux and heartburn has been relieved.  She has however had recurrent issues with abdominal pain.  In January of this year she presented with right upper quadrant pain with essentially negative work-up and underwent laparoscopic cholecystectomy for presumed biliary dyskinesia.  She seemed to do pretty well after this for a couple of weeks but then later in the spring she developed persistent left lower quadrant abdominal pain associated with diarrhea.  CT at that time did show possibly some thickening of bowel loops in the left lower quadrant.  She had an extensive GI work-up at that time with multiple stool studies and lab work and GI evaluation by Dr. Paulita Fujita with no definite cause found.  At that time she also developed some epigastric pain with solids or fluids which was associated with lightheadedness and weakness.  Subsequently in April of this year was admitted and underwent diagnostic laparoscopy with a minor adhesion to her jejunojejunostomy of questionable significance but no internal hernia or other abnormalities found.  EGD at that time was also negative.  She seemed to gradually improve following this and states she has done pretty well for the last several months. However she states this morning she awoke with mild epigastric pain.  Later in the day she felt a little bit nauseated but thought she just might be hungry.  She ate dinner with a small amount of soft stew and after eating a few bites developed fairly rapidly the onset of severe sharp  epigastric pain radiating to both upper quadrants.  She describes 10/10 pain.  This was associated with nausea and she vomited twice.  Emesis was dark but not grossly bloody.  She called and I recommended evaluation in the emergency department.  She said she also felt weak and lightheaded and dizzy.  She has had continued mild loose frequent bowel movements over the last month since her cholecystectomy which are unchanged.  No urinary symptoms.  No fever or chills.  She has not had relief of her pain in the emergency department with IV fentanyl.  She describes constant pain with exacerbations.  Past Medical History:  Diagnosis Date  . Anxiety   . Cancer ([place]) yrs ago   mOLAR PREGNACY CANCEROUS REMOVED  . GERD (gastroesophageal reflux disease)   . History of hiatal hernia   . Pneumonia yrs ago    Past Surgical History:  Procedure Laterality Date  . ABDOMINAL HYSTERECTOMY     ovaries left  . APPENDECTOMY    . CHOLECYSTECTOMY N/A 02/23/2017   Procedure: LAPAROSCOPIC CHOLECYSTECTOMY;  Surgeon: Michael Boston, MD;  Location: [facility];  Service: General;  Laterality: N/A;  . DILATION AND CURETTAGE OF UTERUS    . ESOPHAGOGASTRODUODENOSCOPY     possible dilation 12/14/16 Dr. Lucia Gaskins  . ESOPHAGOGASTRODUODENOSCOPY N/A 02/23/2017   Procedure: ESOPHAGOGASTRODUODENOSCOPY (EGD);  Surgeon: Michael Boston, MD;  Location: [facility];  Service: General;  Laterality: N/A;  . ESOPHAGOGASTRODUODENOSCOPY N/A 06/14/2017   Procedure: ESOPHAGOGASTRODUODENOSCOPY (EGD);  Surgeon: Excell Seltzer, MD;  Location: [facility];  Service: General;  Laterality: N/A;  . ESOPHAGOGASTRODUODENOSCOPY (EGD) WITH PROPOFOL  N/A 12/14/2016   Procedure: ESOPHAGOGASTRODUODENOSCOPY (EGD) WITH PROPOFOL WITH POSSIBLE DILATATION;  Surgeon: Alphonsa Overall, MD;  Location: [facility];  Service: General;  Laterality: N/A;  . HERNIA REPAIR    . LAPAROSCOPIC CHOLECYSTECTOMY SINGLE SITE WITH INTRAOPERATIVE CHOLANGIOGRAM N/A 02/23/2017   Procedure: DIAGNOSTIC  LAPAROSCOPY;  Surgeon: Michael Boston, MD;  Location: [facility];  Service: General;  Laterality: N/A;  . LAPAROSCOPIC GASTRIC SLEEVE RESECTION WITH HIATAL HERNIA REPAIR N/A 11/12/2015   Procedure: LAPAROSCOPIC GASTRIC SLEEVE RESECTION WITH HIATAL HERNIA REPAIR WITH UPPER ENDO;  Surgeon: Excell Seltzer, MD;  Location: [facility];  Service: General;  Laterality: N/A;  . LAPAROSCOPIC ROUX-EN-Y GASTRIC BYPASS WITH HIATAL HERNIA REPAIR  12/29/2016   Procedure: LAPAROSCOPIC ROUX-EN-Y GASTRIC BYPASS WITH HIATAL HERNIA REPAIR;  Surgeon: Excell Seltzer, MD;  Location: [facility];  Service: General;;  . LAPAROSCOPY N/A 07/29/2016   Procedure: LAPAROSCOPY DIAGNOSTIC with appendectomy and lysis of adhesions;  Surgeon: Judeth Horn, MD;  Location: [place];  Service: General;  Laterality: N/A;  . LAPAROSCOPY N/A 06/14/2017   Procedure: LAPAROSCOPY DIAGNOSTIC ERAS PATHWAY;  Surgeon: Excell Seltzer, MD;  Location[facility];  Service: General;  Laterality: N/A;  . UPPER GI ENDOSCOPY  11/12/2015   Procedure: UPPER GI ENDOSCOPY;  Surgeon: Excell Seltzer, MD;  Location: [facility];  Service: General;;  . UPPER GI ENDOSCOPY  12/29/2016   Procedure: UPPER GI ENDOSCOPY;  Surgeon: Excell Seltzer, MD;  Location: [facility];  Service: General;;    Family History  Problem Relation Age of Onset  . Hypertension Other   . Asthma Other   . Cancer Other   . Diabetes Other    Social History:  reports that she has never smoked. She has never used smokeless tobacco. She reports that she does not drink alcohol or use drugs.  Allergies:  Allergies  Allergen Reactions  . Fish Allergy Anaphylaxis  . Penicillins Anaphylaxis    Has patient had a PCN reaction causing immediate rash, facial/tongue/throat swelling, SOB or lightheadedness with hypotension: Yes Has patient had a PCN reaction causing severe rash involving mucus membranes or skin necrosis: No Has patient had a PCN reaction that required hospitalization No Has patient had a PCN  reaction occurring within the last 10 years: No If all of the above answers are "NO", then may proceed with Cephalosporin use.   . Shellfish Allergy Anaphylaxis  . Ceclor [Cefaclor] Hives   Current Facility-Administered Medications  Medication Dose Route Frequency Provider Last Rate Last Dose  . fentaNYL (SUBLIMAZE) injection 100 mcg  100 mcg Intravenous Q30 min PRN Daleen Bo, MD   100 mcg at 10/01/17 2214  . ondansetron (ZOFRAN-ODT) disintegrating tablet 4 mg  4 mg Oral Once PRN Daleen Bo, MD       Current Outpatient Medications  Medication Sig Dispense Refill  . acetaminophen (TYLENOL) 500 MG tablet Take 1,000 mg by mouth every 8 (eight) hours as needed for mild pain or headache.    Marland Kitchen CLINDAMYCIN HCL PO Take 1 capsule by mouth every 6 (six) hours. Starting 09/23/17 until finished.    . clonazePAM (KLONOPIN) 0.5 MG tablet Take 0.5 mg by mouth daily as needed for anxiety (panic attacks.).    Marland Kitchen escitalopram (LEXAPRO) 20 MG tablet Take 20 mg by mouth at bedtime.     . Multiple Vitamins-Minerals (BARIATRIC MULTIVITAMINS/IRON PO) Take 1 capsule by mouth daily.    . pantoprazole (PROTONIX) 40 MG tablet Take 40 mg by mouth daily after breakfast.        Results  for orders placed or performed during the hospital encounter of 10/01/17 (from the past 48 hour(s))  Lipase, blood     Status: None   Collection Time: 10/01/17  8:39 PM  Result Value Ref Range   Lipase 50 11 - 51 U/L    Comment: Performed at [facility], [place] [address] [place], [place] [address]  Comprehensive metabolic panel     Status: None   Collection Time: 10/01/17  8:39 PM  Result Value Ref Range   Sodium 141 135 - 145 mmol/L   Potassium 4.0 3.5 - 5.1 mmol/L   Chloride 108 98 - 111 mmol/L   CO2 26 22 - 32 mmol/L   Glucose, Bld 91 70 - 99 mg/dL   BUN 11 6 - 20 mg/dL   Creatinine, Ser 0.92 0.44 - 1.00 mg/dL   Calcium 9.0 8.9 - 10.3 mg/dL   Total Protein 6.8 6.5 - 8.1 g/dL   Albumin 3.9 3.5 -  5.0 g/dL   AST 29 15 - 41 U/L   ALT 22 0 - 44 U/L   Alkaline Phosphatase 56 38 - 126 U/L   Total Bilirubin 0.6 0.3 - 1.2 mg/dL   GFR calc non Af Amer >60 >60 mL/min   GFR calc Af Amer >60 >60 mL/min    Comment: (NOTE) The eGFR has been calculated using the CKD EPI equation. This calculation has not been validated in all clinical situations. eGFR's persistently <60 mL/min signify possible Chronic Kidney Disease.    Anion gap 7 5 - 15    Comment: Performed at [facility], [place] [address], [place], [place] [address]  CBC     Status: Abnormal   Collection Time: 10/01/17  8:39 PM  Result Value Ref Range   WBC 11.0 (H) 4.0 - 10.5 K/uL   RBC 4.65 3.87 - 5.11 MIL/uL   Hemoglobin 13.9 12.0 - 15.0 g/dL   HCT 42.1 36.0 - 46.0 %   MCV 90.5 78.0 - 100.0 fL   MCH 29.9 26.0 - 34.0 pg   MCHC 33.0 30.0 - 36.0 g/dL   RDW 13.7 11.5 - 15.5 %   Platelets 294 150 - 400 K/uL    Comment: Performed at [facility], [place] [address], [place], [place] [address]  Urinalysis, Routine w reflex microscopic     Status: Abnormal   Collection Time: 10/01/17  9:03 PM  Result Value Ref Range   Color, Urine YELLOW YELLOW   APPearance CLEAR CLEAR   Specific Gravity, Urine 1.009 1.005 - 1.030   pH 5.0 5.0 - 8.0   Glucose, UA >=500 (A) NEGATIVE mg/dL   Hgb urine dipstick NEGATIVE NEGATIVE   Bilirubin Urine NEGATIVE NEGATIVE   Ketones, ur NEGATIVE NEGATIVE mg/dL   Protein, ur NEGATIVE NEGATIVE mg/dL   Nitrite NEGATIVE NEGATIVE   Leukocytes, UA NEGATIVE NEGATIVE   RBC / HPF 0-5 0 - 5 RBC/hpf   WBC, UA 0-5 0 - 5 WBC/hpf   Bacteria, UA FEW (A) NONE SEEN   Squamous Epithelial / LPF 0-5 0 - 5   Mucus PRESENT     Comment: Performed at [facility], [place] [address] [place], [place] [address]  I-Stat CG4 Lactic Acid, ED     Status: None   Collection Time: 10/01/17  9:31 PM  Result Value Ref Range   Lactic Acid, Venous 1.19 0.5 - 1.9 mmol/L   Ct Abdomen  Pelvis W Contrast  Result Date: 10/01/2017 CLINICAL DATA:  Abdominal pain and vomiting today. Status post gastric bypass previously. History of cholecystectomy and appendectomy. EXAM: CT ABDOMEN AND PELVIS WITH CONTRAST TECHNIQUE: Multidetector CT imaging of the abdomen and pelvis was performed using the standard protocol following bolus administration of intravenous contrast. CONTRAST:  142m ISOVUE-300 IOPAMIDOL (ISOVUE-300) INJECTION 61% COMPARISON:  05/12/2017. FINDINGS: Lower chest: Unremarkable. Hepatobiliary: No focal liver abnormality is seen. Status post cholecystectomy. No biliary dilatation. Pancreas: Unremarkable. No pancreatic ductal dilatation or surrounding inflammatory changes. Spleen: Normal in size without focal abnormality. Adrenals/Urinary Tract: Adrenal glands are unremarkable. Kidneys are normal, without renal calculi, focal lesion, or hydronephrosis. Bladder is unremarkable. Stomach/Bowel: Post gastric bypass changes. Unremarkable colon and small bowel. Surgically absent appendix. Vascular/Lymphatic: No significant vascular findings are present. No enlarged abdominal or pelvic lymph nodes. Reproductive: Status post hysterectomy. No adnexal masses. Other: No abdominal wall hernia or abnormality. No abdominopelvic ascites. Musculoskeletal: Mild scoliosis. Mild lumbar and lower thoracic spine degenerative changes. Scattered bone islands. Mild lumbar and lower thoracic spine degenerative changes. IMPRESSION: No acute abnormality. Electronically Signed   By: SClaudie ReveringM.D.   On: 10/01/2017 22:42    Review of Systems  Constitutional: Negative for chills and fever.  Respiratory: Negative for cough, shortness of breath and wheezing.   Cardiovascular: Negative for chest pain and palpitations.  Gastrointestinal: Positive for abdominal pain, diarrhea, nausea and vomiting. Negative for blood in stool, constipation and heartburn.  Genitourinary: Negative.   Neurological: Positive for dizziness  and weakness.  Psychiatric/Behavioral: The patient is nervous/anxious.     Blood pressure (!) 125/98, pulse 91, temperature 98.7 F (37.1 C), temperature source Oral, resp. rate (!) 9, height _0  (1.626 m), weight 90.7 kg, SpO2 99 %. Physical Exam  General: Alert, mildly obese Caucasian female, tearful and uncomfortable Skin: Warm and dry without rash or infection. HEENT: No palpable masses or thyromegaly. Sclera nonicteric. Pupils equal round and reactive. Oropharynx clear. Lymph nodes: No cervical, supraclavicular, or inguinal nodes palpable. Lungs: Breath sounds clear and equal without increased work of breathing Cardiovascular: Regular rate and rhythm without murmur. No JVD or edema. Peripheral pulses intact. Abdomen: Nondistended.  Bowel sounds normoactive.  Diffuse moderate tenderness.. No masses palpable. No organomegaly. No palpable hernias. Extremities: No edema or joint swelling or deformity. No chronic venous stasis changes. Neurologic: Alert and fully oriented.  No gross motor deficits.   Assessment/Plan Acute epigastric abdominal pain, nausea and vomiting.  Patient with history of sleeve gastrectomy and almost 1 year post conversion to Roux-en-Y gastric bypass.  Has history of recurrent abdominal pain of uncertain etiology.  Status post cholecystectomy.  Status post negative laparoscopy and EGD in April of this year.  Work-up in the ED tonight is unremarkable except for minimally elevated WBC.  I have reviewed her CT scan which appears perfectly normal.  Onset of pain is not typical for ulcer.  There was no evidence of internal hernia on recent laparoscopy and no evidence of this on CT.  As I discussed with the patient and her husband I do not see an acute surgical cause of her pain.  She remains symptomatic and I will admit her for symptom control and observation.  She does not improve quickly I will ask GI to see again for evaluation for a possible nonsurgical cause of her  pain.  BEdward Jolly MD 10/01/2017, 11:48 PM

## 2017-10-01 NOTE — ED Notes (Signed)
Patient transported to CT 

## 2017-10-01 NOTE — ED Triage Notes (Addendum)
Pt is a bariatric patient with a gastric sleeve with bypass revision. Pt presents with midline abd pain that radiates into her lower quadrants. Patient states pain was nagging at the start but when she began to eat dinner the pain suddenly became intense. Patient has had a appendectomy and cholecystectomy. Patient states she spoke with Dr. Excell Seltzer and he told her to come here. Patient has had to have exploratory lap for this before, was found to have scar tissue and "kinks" that were causing the pain. Patient tachypneic and tearful.

## 2017-10-02 DIAGNOSIS — Z9884 Bariatric surgery status: Secondary | ICD-10-CM | POA: Diagnosis not present

## 2017-10-02 DIAGNOSIS — R112 Nausea with vomiting, unspecified: Secondary | ICD-10-CM | POA: Diagnosis present

## 2017-10-02 DIAGNOSIS — R197 Diarrhea, unspecified: Secondary | ICD-10-CM | POA: Diagnosis present

## 2017-10-02 DIAGNOSIS — Z90710 Acquired absence of both cervix and uterus: Secondary | ICD-10-CM | POA: Diagnosis not present

## 2017-10-02 DIAGNOSIS — Z9049 Acquired absence of other specified parts of digestive tract: Secondary | ICD-10-CM | POA: Diagnosis not present

## 2017-10-02 DIAGNOSIS — R1013 Epigastric pain: Secondary | ICD-10-CM | POA: Diagnosis present

## 2017-10-02 LAB — CBC
HCT: 39.3 % (ref 36.0–46.0)
Hemoglobin: 12.8 g/dL (ref 12.0–15.0)
MCH: 29.8 pg (ref 26.0–34.0)
MCHC: 32.6 g/dL (ref 30.0–36.0)
MCV: 91.6 fL (ref 78.0–100.0)
PLATELETS: 261 10*3/uL (ref 150–400)
RBC: 4.29 MIL/uL (ref 3.87–5.11)
RDW: 14 % (ref 11.5–15.5)
WBC: 9.5 10*3/uL (ref 4.0–10.5)

## 2017-10-02 MED ORDER — ESCITALOPRAM OXALATE 20 MG PO TABS
20.0000 mg | ORAL_TABLET | Freq: Every day | ORAL | Status: DC
  Administered 2017-10-02 – 2017-10-05 (×4): 20 mg via ORAL
  Filled 2017-10-02 (×4): qty 1

## 2017-10-02 MED ORDER — CLONAZEPAM 0.5 MG PO TABS: 0.5000 mg | ORAL_TABLET | Freq: Every day | ORAL | Status: DC | PRN

## 2017-10-02 MED ORDER — ONDANSETRON 4 MG PO TBDP: 4.0000 mg | ORAL_TABLET | Freq: Four times a day (QID) | ORAL | Status: DC | PRN

## 2017-10-02 MED ORDER — SUCRALFATE 1 GM/10ML PO SUSP
1.0000 g | Freq: Three times a day (TID) | ORAL | Status: DC
  Administered 2017-10-02 – 2017-10-06 (×11): 1 g via ORAL
  Filled 2017-10-02 (×11): qty 10

## 2017-10-02 MED ORDER — ACETAMINOPHEN 500 MG PO TABS
1000.0000 mg | ORAL_TABLET | Freq: Three times a day (TID) | ORAL | Status: DC | PRN
  Administered 2017-10-02 – 2017-10-03 (×2): 1000 mg via ORAL
  Filled 2017-10-02 (×2): qty 2

## 2017-10-02 MED ORDER — HYDROMORPHONE HCL 1 MG/ML IJ SOLN
2.0000 mg | INTRAMUSCULAR | Status: DC | PRN
  Administered 2017-10-02 – 2017-10-06 (×21): 2 mg via INTRAVENOUS
  Filled 2017-10-02 (×21): qty 2

## 2017-10-02 MED ORDER — GABAPENTIN 250 MG/5ML PO SOLN
300.0000 mg | Freq: Three times a day (TID) | ORAL | Status: DC
  Administered 2017-10-02 – 2017-10-06 (×7): 300 mg via ORAL
  Filled 2017-10-02 (×13): qty 6

## 2017-10-02 MED ORDER — ONDANSETRON HCL 4 MG/2ML IJ SOLN
4.0000 mg | Freq: Four times a day (QID) | INTRAMUSCULAR | Status: DC | PRN
  Administered 2017-10-02 – 2017-10-04 (×5): 4 mg via INTRAVENOUS
  Filled 2017-10-02 (×4): qty 2

## 2017-10-02 MED ORDER — ENOXAPARIN SODIUM 40 MG/0.4ML ~~LOC~~ SOLN
40.0000 mg | Freq: Every day | SUBCUTANEOUS | Status: DC
  Administered 2017-10-02 – 2017-10-05 (×4): 40 mg via SUBCUTANEOUS
  Filled 2017-10-02 (×4): qty 0.4

## 2017-10-02 MED ORDER — METHOCARBAMOL 1000 MG/10ML IJ SOLN
500.0000 mg | Freq: Four times a day (QID) | INTRAVENOUS | Status: DC
  Administered 2017-10-02 – 2017-10-06 (×16): 500 mg via INTRAVENOUS
  Filled 2017-10-02 (×14): qty 500
  Filled 2017-10-02: qty 5
  Filled 2017-10-02 (×2): qty 500

## 2017-10-02 MED ORDER — POTASSIUM CHLORIDE IN NACL 20-0.9 MEQ/L-% IV SOLN
INTRAVENOUS | Status: DC
  Administered 2017-10-02 – 2017-10-06 (×9): via INTRAVENOUS
  Filled 2017-10-02 (×11): qty 1000

## 2017-10-02 MED ORDER — PANTOPRAZOLE SODIUM 40 MG PO TBEC
40.0000 mg | DELAYED_RELEASE_TABLET | Freq: Every day | ORAL | Status: DC
  Administered 2017-10-02 – 2017-10-03 (×2): 40 mg via ORAL
  Filled 2017-10-02 (×2): qty 1

## 2017-10-02 MED ORDER — METOPROLOL TARTRATE 5 MG/5ML IV SOLN: 5.0000 mg | Freq: Four times a day (QID) | INTRAVENOUS | Status: DC | PRN

## 2017-10-02 MED ORDER — ADULT MULTIVITAMIN W/MINERALS CH
1.0000 | ORAL_TABLET | Freq: Every day | ORAL | Status: DC
  Administered 2017-10-03: 1 via ORAL
  Filled 2017-10-02: qty 1

## 2017-10-02 NOTE — Progress Notes (Signed)
   Subjective/Chief Complaint: Continues to have pain.    Objective: Vital signs in last 24 hours: Temp:  [97.9 F (36.6 C)-98.7 F (37.1 C)] 97.9 F (36.6 C) (08/10 0907) Pulse Rate:  [68-140] 76 (08/10 0907) Resp:  [9-22] 20 (08/10 0907) BP: (109-150)/(73-119) 109/75 (08/10 0907) SpO2:  [94 %-100 %] 100 % (08/10 0907) Weight:  [90.7 kg] 90.7 kg (08/09 1941) Last BM Date: 10/01/17  Intake/Output from previous day: 08/09 0701 - 08/10 0700 In: 1694.5 [P.O.:240; I.V.:454.5; IV Piggyback:1000] Out: 400 [Urine:400] Intake/Output this shift: Total I/O In: 0  Out: 500 [Urine:500]  General appearance: alert and tearful Resp: clear to auscultation bilaterally GI: soft, nondistended, tender with voluntary guarding left hemiabdomen more then right Skin: Skin color, texture, turgor normal. No rashes or lesions  Lab Results:  Recent Labs    10/01/17 2039 10/02/17 0501  WBC 11.0* 9.5  HGB 13.9 12.8  HCT 42.1 39.3  PLT 294 261   BMET Recent Labs    10/01/17 2039  NA 141  K 4.0  CL 108  CO2 26  GLUCOSE 91  BUN 11  CREATININE 0.92  CALCIUM 9.0   PT/INR No results for input(s): LABPROT, INR in the last 72 hours. ABG No results for input(s): PHART, HCO3 in the last 72 hours.  Invalid input(s): PCO2, PO2  Studies/Results: Ct Abdomen Pelvis W Contrast  Result Date: 10/01/2017 CLINICAL DATA:  Abdominal pain and vomiting today. Status post gastric bypass previously. History of cholecystectomy and appendectomy. EXAM: CT ABDOMEN AND PELVIS WITH CONTRAST TECHNIQUE: Multidetector CT imaging of the abdomen and pelvis was performed using the standard protocol following bolus administration of intravenous contrast. CONTRAST:  152mL ISOVUE-300 IOPAMIDOL (ISOVUE-300) INJECTION 61% COMPARISON:  05/12/2017. FINDINGS: Lower chest: Unremarkable. Hepatobiliary: No focal liver abnormality is seen. Status post cholecystectomy. No biliary dilatation. Pancreas: Unremarkable. No pancreatic  ductal dilatation or surrounding inflammatory changes. Spleen: Normal in size without focal abnormality. Adrenals/Urinary Tract: Adrenal glands are unremarkable. Kidneys are normal, without renal calculi, focal lesion, or hydronephrosis. Bladder is unremarkable. Stomach/Bowel: Post gastric bypass changes. Unremarkable colon and small bowel. Surgically absent appendix. Vascular/Lymphatic: No significant vascular findings are present. No enlarged abdominal or pelvic lymph nodes. Reproductive: Status post hysterectomy. No adnexal masses. Other: No abdominal wall hernia or abnormality. No abdominopelvic ascites. Musculoskeletal: Mild scoliosis. Mild lumbar and lower thoracic spine degenerative changes. Scattered bone islands. Mild lumbar and lower thoracic spine degenerative changes. IMPRESSION: No acute abnormality. Electronically Signed   By: Claudie Revering M.D.   On: 10/01/2017 22:42    Anti-infectives: Anti-infectives (From admission, onward)   None      Assessment/Plan: RYGB patient with recurrent abdominal pain of unknown etiology. Has had extensive workup for similar presentation as recently as 4 months ago, CT negative last night Will try multimodal pain regimen with robaxin, neurontin, tylenol, heating pads Add carafate although prior egd negative  Continue supportive care  LOS: 0 days    Clovis Riley 10/02/2017

## 2017-10-03 LAB — BASIC METABOLIC PANEL
ANION GAP: 6 (ref 5–15)
BUN: 7 mg/dL (ref 6–20)
CO2: 25 mmol/L (ref 22–32)
Calcium: 8.7 mg/dL — ABNORMAL LOW (ref 8.9–10.3)
Chloride: 107 mmol/L (ref 98–111)
Creatinine, Ser: 0.58 mg/dL (ref 0.44–1.00)
Glucose, Bld: 109 mg/dL — ABNORMAL HIGH (ref 70–99)
POTASSIUM: 4.4 mmol/L (ref 3.5–5.1)
SODIUM: 138 mmol/L (ref 135–145)

## 2017-10-03 LAB — CBC
HCT: 38.7 % (ref 36.0–46.0)
HEMOGLOBIN: 12.7 g/dL (ref 12.0–15.0)
MCH: 30.2 pg (ref 26.0–34.0)
MCHC: 32.8 g/dL (ref 30.0–36.0)
MCV: 91.9 fL (ref 78.0–100.0)
Platelets: 249 10*3/uL (ref 150–400)
RBC: 4.21 MIL/uL (ref 3.87–5.11)
RDW: 13.5 % (ref 11.5–15.5)
WBC: 8.9 10*3/uL (ref 4.0–10.5)

## 2017-10-03 LAB — MAGNESIUM: MAGNESIUM: 1.8 mg/dL (ref 1.7–2.4)

## 2017-10-03 MED ORDER — ACETAMINOPHEN 500 MG PO TABS
1000.0000 mg | ORAL_TABLET | Freq: Three times a day (TID) | ORAL | Status: DC
  Administered 2017-10-03 – 2017-10-06 (×7): 1000 mg via ORAL
  Filled 2017-10-03 (×7): qty 2

## 2017-10-03 MED ORDER — OXYCODONE HCL 5 MG/5ML PO SOLN
5.0000 mg | ORAL | Status: DC | PRN
  Administered 2017-10-03 – 2017-10-06 (×4): 5 mg via ORAL
  Filled 2017-10-03 (×4): qty 5

## 2017-10-03 MED ORDER — BISACODYL 10 MG RE SUPP: 10.0000 mg | Freq: Every day | RECTAL | Status: DC | PRN

## 2017-10-03 MED ORDER — DOCUSATE SODIUM 100 MG PO CAPS
100.0000 mg | ORAL_CAPSULE | Freq: Two times a day (BID) | ORAL | Status: DC
Start: 2017-10-03 — End: 2017-10-06
  Administered 2017-10-03 – 2017-10-05 (×4): 100 mg via ORAL
  Filled 2017-10-03 (×4): qty 1

## 2017-10-03 MED ORDER — FAMOTIDINE IN NACL 20-0.9 MG/50ML-% IV SOLN
20.0000 mg | Freq: Two times a day (BID) | INTRAVENOUS | Status: DC
  Administered 2017-10-03 – 2017-10-05 (×6): 20 mg via INTRAVENOUS
  Filled 2017-10-03 (×6): qty 50

## 2017-10-03 NOTE — Progress Notes (Signed)
   Subjective/Chief Complaint: Continues to have pain. Emesis with gabapentin and pain from popsicle yesterday. No flatus or BM   Objective: Vital signs in last 24 hours: Temp:  [97.9 F (36.6 C)-98.4 F (36.9 C)] 97.9 F (36.6 C) (08/11 0522) Pulse Rate:  [74-84] 74 (08/11 0522) Resp:  [16-20] 18 (08/11 0522) BP: (109-133)/(75-94) 123/76 (08/11 0522) SpO2:  [97 %-100 %] 97 % (08/11 0522) Last BM Date: 10/01/17  Intake/Output from previous day: 08/10 0701 - 08/11 0700 In: 2882.3 [P.O.:600; I.V.:2182.3; IV Piggyback:100] Out: 1930 [JQZES:9233] Intake/Output this shift: No intake/output data recorded.  General appearance: alert and tearful Resp: clear to auscultation bilaterally GI: soft, nondistended, tender with voluntary guarding left hemiabdomen more then right Skin: Skin color, texture, turgor normal. No rashes or lesions  Lab Results:  Recent Labs    10/02/17 0501 10/03/17 0358  WBC 9.5 8.9  HGB 12.8 12.7  HCT 39.3 38.7  PLT 261 249   BMET Recent Labs    10/01/17 2039 10/03/17 0358  NA 141 138  K 4.0 4.4  CL 108 107  CO2 26 25  GLUCOSE 91 109*  BUN 11 7  CREATININE 0.92 0.58  CALCIUM 9.0 8.7*   PT/INR No results for input(s): LABPROT, INR in the last 72 hours. ABG No results for input(s): PHART, HCO3 in the last 72 hours.  Invalid input(s): PCO2, PO2  Studies/Results: Ct Abdomen Pelvis W Contrast  Result Date: 10/01/2017 CLINICAL DATA:  Abdominal pain and vomiting today. Status post gastric bypass previously. History of cholecystectomy and appendectomy. EXAM: CT ABDOMEN AND PELVIS WITH CONTRAST TECHNIQUE: Multidetector CT imaging of the abdomen and pelvis was performed using the standard protocol following bolus administration of intravenous contrast. CONTRAST:  154mL ISOVUE-300 IOPAMIDOL (ISOVUE-300) INJECTION 61% COMPARISON:  05/12/2017. FINDINGS: Lower chest: Unremarkable. Hepatobiliary: No focal liver abnormality is seen. Status post  cholecystectomy. No biliary dilatation. Pancreas: Unremarkable. No pancreatic ductal dilatation or surrounding inflammatory changes. Spleen: Normal in size without focal abnormality. Adrenals/Urinary Tract: Adrenal glands are unremarkable. Kidneys are normal, without renal calculi, focal lesion, or hydronephrosis. Bladder is unremarkable. Stomach/Bowel: Post gastric bypass changes. Unremarkable colon and small bowel. Surgically absent appendix. Vascular/Lymphatic: No significant vascular findings are present. No enlarged abdominal or pelvic lymph nodes. Reproductive: Status post hysterectomy. No adnexal masses. Other: No abdominal wall hernia or abnormality. No abdominopelvic ascites. Musculoskeletal: Mild scoliosis. Mild lumbar and lower thoracic spine degenerative changes. Scattered bone islands. Mild lumbar and lower thoracic spine degenerative changes. IMPRESSION: No acute abnormality. Electronically Signed   By: Claudie Revering M.D.   On: 10/01/2017 22:42    Anti-infectives: Anti-infectives (From admission, onward)   None      Assessment/Plan: RYGB patient with recurrent abdominal pain of unknown etiology. Has had extensive workup for similar presentation as recently as 4 months ago, CT negative 8/9 Continue multimodal pain regimen with robaxin, neurontin, tylenol, heating pads Add carafate although prior egd negative  Continue supportive care  NPO today. Out of bed.   GI consult per Dr. Excell Seltzer tomorrow   LOS: 1 day    Clovis Riley 10/03/2017

## 2017-10-04 NOTE — Progress Notes (Signed)
Patient ID: Mary Williams, female   DOB: 01/27/1976, 42 y.o.   MRN: [id number]     Subjective: Patient complains of continued constant abdominal pain and nausea unchanged.  She feels there is more radiation into her lower abdomen.  Nausea without vomiting.  Objective: Vital signs in last 24 hours: Temp:  [98.6 F (37 C)-99.1 F (37.3 C)] 98.7 F (37.1 C) (08/12 0548) Pulse Rate:  [74-79] 74 (08/12 0548) Resp:  [18-20] 18 (08/12 0548) BP: (117-134)/(82-89) 131/89 (08/12 0548) SpO2:  [97 %-100 %] 97 % (08/12 0548) Last BM Date: 10/02/17  Intake/Output from previous day: 08/11 0701 - 08/12 0700 In: 1949.8 [I.V.:1049.8; IV Piggyback:900] Out: 8588 [Urine:3475] Intake/Output this shift: No intake/output data recorded.  General appearance: alert, cooperative, mild distress and Tearful, up out of bed in chair GI: Nondistended.  Moderate diffuse tenderness greater on the left than the right.  Lab Results:  Recent Labs    10/02/17 0501 10/03/17 0358  WBC 9.5 8.9  HGB 12.8 12.7  HCT 39.3 38.7  PLT 261 249   BMET Recent Labs    10/01/17 2039 10/03/17 0358  NA 141 138  K 4.0 4.4  CL 108 107  CO2 26 25  GLUCOSE 91 109*  BUN 11 7  CREATININE 0.92 0.58  CALCIUM 9.0 8.7*     Studies/Results: No results found.  Anti-infectives: Anti-infectives (From admission, onward)   None      Assessment/Plan: Acute epigastric abdominal pain, nausea and vomiting.  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  Has history of recurrent abdominal pain of uncertain etiology.  Status post cholecystectomy.  Status post negative laparoscopy and EGD in April of this year.  This spring CT scan did suggest some possible enteritis involving small bowel loops in the left lower quadrant.  Multiple stool studies were negative and subsequent CT negative.  Laparoscopy negative. Evaluation this  hospitalization not revealing with repeat negative CT scan, normal lab work.  Etiology is not clear to me.  Possible overlay of anxiety. I think it would be reasonable to rule out ulcer or gastritis with repeat upper endoscopy.  I have asked GI to see in this regard and see if they have any other ideas regarding etiology/treatment.    LOS: 2 days    Edward Jolly 10/04/2017

## 2017-10-04 NOTE — Consult Note (Signed)
Subjective:   HPI  The patient is a 42 year old female who we are asked to see in regards to upper abdominal pain.  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  After the conversion her heartburn improved.  She has had recurring episodes of abdominal pain.  In January she had right upper quadrant abdominal pain with an essentially negative work-up but had a laparoscopic cholecystectomy done for presumed biliary dyskinesia to see if this helped.  She improved for a while but then developed abdominal pain again.  She had an extensive GI evaluation earlier this year by Dr. Paulita Fujita but no definite cause was found.  She subsequently underwent a diagnostic laparoscopy with findings of the minor adhesion to her jejunojejunostomy of questionable significance.  Patient states that she did improve after that particular surgery however.  She was admitted to the hospital again on August 9 because of recurring abdominal pain which started in the upper abdomen but later went down into the mid abdomen.  She states that occurred after eating some beef stew.  She had some emesis which was dark in color.  Not clear if this was beef stew or heme.  CT of abdomen in the ER was negative.  She denies constipation.  She has been on pantoprazole.  Review of Systems No chest pain or shortness of breath  Past Medical History:  Diagnosis Date  . Anxiety   . Cancer ([place]) yrs ago   mOLAR PREGNACY CANCEROUS REMOVED  . GERD (gastroesophageal reflux disease)   . History of hiatal hernia   . Pneumonia yrs ago   Past Surgical History:  Procedure Laterality Date  . ABDOMINAL HYSTERECTOMY     ovaries left  . APPENDECTOMY    . CHOLECYSTECTOMY N/A 02/23/2017   Procedure: LAPAROSCOPIC CHOLECYSTECTOMY;  Surgeon: Michael Boston, MD;  Location: [facility];  Service: General;   Laterality: N/A;  . DILATION AND CURETTAGE OF UTERUS    . ESOPHAGOGASTRODUODENOSCOPY     possible dilation 12/14/16 Dr. Lucia Gaskins  . ESOPHAGOGASTRODUODENOSCOPY N/A 02/23/2017   Procedure: ESOPHAGOGASTRODUODENOSCOPY (EGD);  Surgeon: Michael Boston, MD;  Location: [facility];  Service: General;  Laterality: N/A;  . ESOPHAGOGASTRODUODENOSCOPY N/A 06/14/2017   Procedure: ESOPHAGOGASTRODUODENOSCOPY (EGD);  Surgeon: Excell Seltzer, MD;  Location: [facility];  Service: General;  Laterality: N/A;  . ESOPHAGOGASTRODUODENOSCOPY (EGD) WITH PROPOFOL N/A 12/14/2016   Procedure: ESOPHAGOGASTRODUODENOSCOPY (EGD) WITH PROPOFOL WITH POSSIBLE DILATATION;  Surgeon: Alphonsa Overall, MD;  Location: [facility];  Service: General;  Laterality: N/A;  . HERNIA REPAIR    . LAPAROSCOPIC CHOLECYSTECTOMY SINGLE SITE WITH INTRAOPERATIVE CHOLANGIOGRAM N/A 02/23/2017   Procedure: DIAGNOSTIC LAPAROSCOPY;  Surgeon: Michael Boston, MD;  Location: [facility];  Service: General;  Laterality: N/A;  . LAPAROSCOPIC GASTRIC SLEEVE RESECTION WITH HIATAL HERNIA REPAIR N/A 11/12/2015   Procedure: LAPAROSCOPIC GASTRIC SLEEVE RESECTION WITH HIATAL HERNIA REPAIR WITH UPPER ENDO;  Surgeon: Excell Seltzer, MD;  Location: [facility];  Service: General;  Laterality: N/A;  . LAPAROSCOPIC ROUX-EN-Y GASTRIC BYPASS WITH HIATAL HERNIA REPAIR  12/29/2016   Procedure: LAPAROSCOPIC ROUX-EN-Y GASTRIC BYPASS WITH HIATAL HERNIA REPAIR;  Surgeon: Excell Seltzer, MD;  Location: [facility];  Service: General;;  . LAPAROSCOPY N/A 07/29/2016   Procedure: LAPAROSCOPY DIAGNOSTIC with appendectomy and lysis of adhesions;  Surgeon: Judeth Horn, MD;  Location: [facility];  Service: General;  Laterality: N/A;  . LAPAROSCOPY N/A 06/14/2017  Procedure: LAPAROSCOPY DIAGNOSTIC ERAS PATHWAY;  Surgeon: Excell Seltzer, MD;  Location: [facility];  Service: General;  Laterality: N/A;  . UPPER GI ENDOSCOPY  11/12/2015   Procedure: UPPER GI ENDOSCOPY;  Surgeon: Excell Seltzer, MD;  Location: [facility];   Service: General;;  . UPPER GI ENDOSCOPY  12/29/2016   Procedure: UPPER GI ENDOSCOPY;  Surgeon: Excell Seltzer, MD;  Location: [facility];  Service: General;;   Social History   Socioeconomic History  . Marital status: Married    Spouse name: Not on file  . Number of children: Not on file  . Years of education: Not on file  . Highest education level: Not on file  Occupational History  . Not on file  Social Needs  . Financial resource strain: Not on file  . Food insecurity:    Worry: Not on file    Inability: Not on file  . Transportation needs:    Medical: Not on file    Non-medical: Not on file  Tobacco Use  . Smoking status: Never Smoker  . Smokeless tobacco: Never Used  Substance and Sexual Activity  . Alcohol use: No    Frequency: Never  . Drug use: No  . Sexual activity: Yes    Birth control/protection: Surgical  Lifestyle  . Physical activity:    Days per week: Not on file    Minutes per session: Not on file  . Stress: Not on file  Relationships  . Social connections:    Talks on phone: Not on file    Gets together: Not on file    Attends religious service: Not on file    Active member of club or organization: Not on file    Attends meetings of clubs or organizations: Not on file    Relationship status: Not on file  . Intimate partner violence:    Fear of current or ex partner: Not on file    Emotionally abused: Not on file    Physically abused: Not on file    Forced sexual activity: Not on file  Other Topics Concern  . Not on file  Social History Narrative  . Not on file   family history includes Asthma in her other; Cancer in her other; Diabetes in her other; Hypertension in her other.  Current Facility-Administered Medications:  .  0.9 % NaCl with KCl 20 mEq/ L  infusion, , Intravenous, Continuous, Hoxworth, Marland Kitchen, MD, Last Rate: 100 mL/hr at 10/03/17 2002 .  acetaminophen (TYLENOL) tablet 1,000 mg, 1,000 mg, Oral, Q8H, Romana Juniper A, MD, 1,000  mg at 10/04/17 0549 .  bisacodyl (DULCOLAX) suppository 10 mg, 10 mg, Rectal, Daily PRN, Romana Juniper A, MD .  clonazePAM (KLONOPIN) tablet 0.5 mg, 0.5 mg, Oral, Daily PRN, Excell Seltzer, MD .  docusate sodium (COLACE) capsule 100 mg, 100 mg, Oral, BID, Romana Juniper A, MD, 100 mg at 10/03/17 2201 .  enoxaparin (LOVENOX) injection 40 mg, 40 mg, Subcutaneous, Daily, Hoxworth, Marland Kitchen, MD, 40 mg at 10/04/17 1001 .  escitalopram (LEXAPRO) tablet 20 mg, 20 mg, Oral, QHS, Excell Seltzer, MD, 20 mg at 10/03/17 2202 .  famotidine (PEPCID) IVPB 20 mg premix, 20 mg, Intravenous, Q12H, Romana Juniper A, MD, Last Rate: 100 mL/hr at 10/04/17 1002, 20 mg at 10/04/17 1002 .  gabapentin (NEURONTIN) 250 MG/5ML solution 300 mg, 300 mg, Oral, Q8H, Connor, Chelsea A, MD, 300 mg at 10/04/17 0550 .  HYDROmorphone (DILAUDID) injection 2 mg, 2 mg, Intravenous, Q2H PRN, Excell Seltzer, MD, 2 mg  at 10/04/17 0853 .  methocarbamol (ROBAXIN) 500 mg in dextrose 5 % 50 mL IVPB, 500 mg, Intravenous, Q6H, Connor, Chelsea A, MD, Last Rate: 100 mL/hr at 10/04/17 1000, 500 mg at 10/04/17 1000 .  metoprolol tartrate (LOPRESSOR) injection 5 mg, 5 mg, Intravenous, Q6H PRN, Excell Seltzer, MD .  multivitamin with minerals tablet 1 tablet, 1 tablet, Oral, Daily, Excell Seltzer, MD, 1 tablet at 10/03/17 0958 .  ondansetron (ZOFRAN-ODT) disintegrating tablet 4 mg, 4 mg, Oral, Q6H PRN **OR** ondansetron (ZOFRAN) injection 4 mg, 4 mg, Intravenous, Q6H PRN, Excell Seltzer, MD, 4 mg at 10/04/17 0957 .  oxyCODONE (ROXICODONE) 5 MG/5ML solution 5 mg, 5 mg, Oral, Q4H PRN, Romana Juniper A, MD, 5 mg at 10/04/17 1408 .  sucralfate (CARAFATE) 1 GM/10ML suspension 1 g, 1 g, Oral, TID WC & HS, Romana Juniper A, MD, 1 g at 10/03/17 2203 Allergies  Allergen Reactions  . Fish Allergy Anaphylaxis  . Penicillins Anaphylaxis    Has patient had a PCN reaction causing immediate rash, facial/tongue/throat swelling, SOB or  lightheadedness with hypotension: Yes Has patient had a PCN reaction causing severe rash involving mucus membranes or skin necrosis: No Has patient had a PCN reaction that required hospitalization No Has patient had a PCN reaction occurring within the last 10 years: No If all of the above answers are "NO", then may proceed with Cephalosporin use.   . Shellfish Allergy Anaphylaxis  . Ceclor [Cefaclor] Hives     Objective:     BP (!) 148/84 (BP Location: Left Arm)   Pulse 84   Temp 98.7 F (37.1 C) (Oral)   Resp 18   Ht 5\' 4"  (1.626 m)   Wt 90.7 kg   SpO2 100%   BMI 34.33 kg/m   Alert  No distress  Heart regular rhythm no murmurs  Lungs clear  Abdomen: Bowel sounds present, soft, some tenderness in the epigastrium  Laboratory No components found for: D1    Assessment:     Abdominal pain of unclear etiology.  Discussed case with Dr. Excell Seltzer.  EGD requested to rule out ulcer.      Plan:     We will plan EGD tomorrow.

## 2017-10-05 ENCOUNTER — Encounter (HOSPITAL_COMMUNITY)
Admission: EM | Disposition: A | Discharge status: Home / Self Care | Payer: Self-pay | Source: Home / Self Care | Attending: General Surgery

## 2017-10-05 ENCOUNTER — Inpatient Hospital Stay (HOSPITAL_COMMUNITY): Payer: Medicaid Other | Admitting: Registered Nurse

## 2017-10-05 ENCOUNTER — Encounter (HOSPITAL_COMMUNITY): Payer: Self-pay | Admitting: *Deleted

## 2017-10-05 HISTORY — PX: ESOPHAGOGASTRODUODENOSCOPY (EGD) WITH PROPOFOL: SHX5813

## 2017-10-05 SURGERY — ESOPHAGOGASTRODUODENOSCOPY (EGD) WITH PROPOFOL
Anesthesia: Monitor Anesthesia Care

## 2017-10-05 MED ORDER — PROPOFOL 10 MG/ML IV BOLUS
INTRAVENOUS | Status: AC
  Filled 2017-10-05: qty 20

## 2017-10-05 MED ORDER — LACTATED RINGERS IV SOLN
INTRAVENOUS | Status: DC
  Administered 2017-10-05: 12:00:00 via INTRAVENOUS

## 2017-10-05 MED ORDER — LIDOCAINE 2% (20 MG/ML) 5 ML SYRINGE
INTRAMUSCULAR | Status: DC | PRN
  Administered 2017-10-05: 60 mg via INTRAVENOUS

## 2017-10-05 MED ORDER — PROPOFOL 500 MG/50ML IV EMUL
INTRAVENOUS | Status: DC | PRN
  Administered 2017-10-05: 150 ug/kg/min via INTRAVENOUS

## 2017-10-05 MED ORDER — PROPOFOL 10 MG/ML IV BOLUS
INTRAVENOUS | Status: DC | PRN
  Administered 2017-10-05: 30 mg via INTRAVENOUS

## 2017-10-05 MED ORDER — SODIUM CHLORIDE 0.9 % IV SOLN: INTRAVENOUS | Status: DC

## 2017-10-05 SURGICAL SUPPLY — 15 items

## 2017-10-05 NOTE — Transfer of Care (Signed)
Immediate Anesthesia Transfer of Care Note  Patient: Mary Williams  Procedure(s) Performed: ESOPHAGOGASTRODUODENOSCOPY (EGD) WITH PROPOFOL (N/A )  Patient Location: PACU  Anesthesia Type:MAC  Level of Consciousness: sedated  Airway & Oxygen Therapy: Patient Spontanous Breathing and Patient connected to nasal cannula oxygen  Post-op Assessment: Report given to RN and Post -op Vital signs reviewed and stable  Post vital signs: Reviewed and stable  Last Vitals:  Vitals Value Taken Time  BP    Temp    Pulse    Resp    SpO2      Last Pain:  Vitals:   10/05/17 1155  TempSrc: Oral  PainSc: 6       Patients Stated Pain Goal: 3 (22/57/50 5183)  Complications: No apparent anesthesia complications

## 2017-10-05 NOTE — Op Note (Signed)
[facility] Patient Name: Mary Williams Procedure Date: 10/05/2017 MRN: [id number] Attending MD: Wonda Horner , MD Date of Birth: 11-12-1975 CSN: [id number] Age: 42 Admit Type: Inpatient Procedure:                Upper GI endoscopy Indications:              Upper abdominal pain Providers:                Wonda Horner, MD, Elmer Ramp. Tilden Dome, RN, Cletis Athens, Technician Referring MD:              Medicines:                Propofol per Anesthesia Complications:            No immediate complications. Estimated Blood Loss:     Estimated blood loss: none. Procedure:                Pre-Anesthesia Assessment:                           - Prior to the procedure, a History and Physical                            was performed, and patient medications and                            allergies were reviewed. The patient's tolerance of                            previous anesthesia was also reviewed. The risks                            and benefits of the procedure and the sedation                            options and risks were discussed with the patient.                            All questions were answered, and informed consent                            was obtained. Prior Anticoagulants: The patient has                            taken no previous anticoagulant or antiplatelet                            agents. ASA Grade Assessment: II - A patient with                            mild systemic disease. After reviewing the risks  and benefits, the patient was deemed in                            satisfactory condition to undergo the procedure.                           After obtaining informed consent, the endoscope was                            passed under direct vision. Throughout the                            procedure, the patient's blood pressure, pulse, and                            oxygen saturations were  monitored continuously. The                            [id number]-H190 ([id number] Olympus adult endoscope was                            introduced through the mouth, and advanced to the                            jejunum. The upper GI endoscopy was accomplished                            without difficulty. The patient tolerated the                            procedure well. Scope In: Scope Out: Findings:      The examined esophagus was normal.      The entire examined stomach was normal. This was the small gastric pouch.      The examined jejunum was normal.      Some metal clips were seen. No ulcers. Nothing to explain abdominal pain. Impression:               - Normal esophagus.                           - Normal stomach.                           - Normal examined jejunum.                           - No specimens collected. Moderate Sedation:      . Recommendation:           - Advance diet as tolerated.                           - Continue present medications. Procedure Code(s):        --- Professional ---                           [phone], Esophagogastroduodenoscopy, flexible,  transoral; diagnostic, including collection of                            specimen(s) by brushing or washing, when performed                            (separate procedure) Diagnosis Code(s):        --- Professional ---                           R10.10, Upper abdominal pain, unspecified CPT copyright 2017 American Medical Association. All rights reserved. The codes documented in this report are preliminary and upon coder review may  be revised to meet current compliance requirements. Wonda Horner, MD 10/05/2017 1:04:34 PM This report has been signed electronically. Number of Addenda: 0

## 2017-10-05 NOTE — H&P (Signed)
The patient presents to the endoscopy department for EGD to evaluate for upper and mid abdominal pain of unclear etiology.  No change in history from yesterday.  Physical: No change from yesterday  Impression abdominal pain etiology unclear  Plan EGD

## 2017-10-05 NOTE — Anesthesia Postprocedure Evaluation (Signed)
Anesthesia Post Note  Patient: Mary Williams  Procedure(s) Performed: ESOPHAGOGASTRODUODENOSCOPY (EGD) WITH PROPOFOL (N/A )     Patient location during evaluation: PACU Anesthesia Type: MAC Level of consciousness: awake and alert Pain management: pain level controlled Vital Signs Assessment: post-procedure vital signs reviewed and stable Respiratory status: spontaneous breathing, nonlabored ventilation, respiratory function stable and patient connected to nasal cannula oxygen Cardiovascular status: stable and blood pressure returned to baseline Postop Assessment: no apparent nausea or vomiting Anesthetic complications: no    Last Vitals:  Vitals:   10/05/17 1310 10/05/17 1343  BP: (!) 159/83 (!) 146/58  Pulse: 68 60  Resp: 13 18  Temp:  36.8 C  SpO2: 100% 100%    Last Pain:  Vitals:   10/05/17 1424  TempSrc:   PainSc: 8                  Ryan P Ellender

## 2017-10-05 NOTE — Anesthesia Preprocedure Evaluation (Addendum)
Anesthesia Evaluation  Patient identified by MRN, date of birth, ID band Patient awake    Reviewed: Allergy & Precautions, NPO status , Patient's Chart, lab work & pertinent test results  Airway Mallampati: II  TM Distance: >3 FB Neck ROM: Full    Dental no notable dental hx.    Pulmonary neg pulmonary ROS,    Pulmonary exam normal breath sounds clear to auscultation       Cardiovascular negative cardio ROS Normal cardiovascular exam Rhythm:Regular Rate:Normal  ECG: ST, rate 103   Neuro/Psych Anxiety negative neurological ROS     GI/Hepatic Neg liver ROS, hiatal hernia, GERD  Controlled and Medicated,  Endo/Other  negative endocrine ROS  Renal/GU negative Renal ROS     Musculoskeletal negative musculoskeletal ROS (+)   Abdominal (+) + obese,   Peds  Hematology negative hematology ROS (+)   Anesthesia Other Findings abdominal pain  Reproductive/Obstetrics                            Anesthesia Physical Anesthesia Plan  ASA: II  Anesthesia Plan: MAC   Post-op Pain Management:    Induction: Intravenous  PONV Risk Score and Plan: 2 and Propofol infusion, TIVA and Treatment may vary due to age or medical condition  Airway Management Planned: Nasal Cannula  Additional Equipment:   Intra-op Plan:   Post-operative Plan:   Informed Consent: I have reviewed the patients History and Physical, chart, labs and discussed the procedure including the risks, benefits and alternatives for the proposed anesthesia with the patient or authorized representative who has indicated his/her understanding and acceptance.   Dental advisory given  Plan Discussed with: CRNA  Anesthesia Plan Comments:         Anesthesia Quick Evaluation

## 2017-10-05 NOTE — Progress Notes (Signed)
Patient ID: Mary Williams, female   DOB: 1975/10/15, 42 y.o.   MRN: [id number]     Subjective: Feels a little bit better today.  Walked a lot last night.  Still having pain but less severe.  Was able to eat a popsicle and take some clear liquids.  Awaiting endoscopy this morning.  Objective: Vital signs in last 24 hours: Temp:  [98.4 F (36.9 C)-98.7 F (37.1 C)] 98.4 F (36.9 C) (08/13 0602) Pulse Rate:  [72-84] 72 (08/13 0602) Resp:  [18] 18 (08/13 0602) BP: (119-148)/(60-96) 119/60 (08/13 0602) SpO2:  [97 %-100 %] 97 % (08/13 0602) Last BM Date: 10/03/17  Intake/Output from previous day: 08/12 0701 - 08/13 0700 In: 7385.9 [P.O.:877; I.V.:2097.8; IV Piggyback:4411.1] Out: 3050 [Urine:3050] Intake/Output this shift: No intake/output data recorded.  General appearance: alert, cooperative and Appears in better spirits today GI: Mild diffuse tenderness  Lab Results:  Recent Labs    10/03/17 0358  WBC 8.9  HGB 12.7  HCT 38.7  PLT 249   BMET Recent Labs    10/03/17 0358  NA 138  K 4.4  CL 107  CO2 25  GLUCOSE 109*  BUN 7  CREATININE 0.58  CALCIUM 8.7*     Studies/Results: No results found.  Anti-infectives: Anti-infectives (From admission, onward)   None      Assessment/Plan: Acute epigastric abdominal pain, nausea and vomiting. 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 Has history of recurrent abdominal pain of uncertain etiology. Status post cholecystectomy. Status post negative laparoscopy and EGD in April of this year.  This spring CT scan did suggest some possible enteritis involving small bowel loops in the left lower quadrant.  Multiple stool studies were negative and subsequent CT negative.  Laparoscopy negative. Evaluation this hospitalization not revealing with repeat negative CT scan, normal lab work.  Etiology is not clear to me.  Possible overlay  of anxiety. Plan for EGD today to rule out ulcer or gastritis. She is symptomatically improved today.  She is encouraged by this.  We discussed possible discharge in the next day or so.    LOS: 3 days    Edward Jolly 10/05/2017

## 2017-10-05 NOTE — Progress Notes (Signed)
No changes in previous assessment since readmission from EGD

## 2017-10-06 LAB — CULTURE, BLOOD (ROUTINE X 2)
CULTURE: NO GROWTH
Culture: NO GROWTH
SPECIAL REQUESTS: ADEQUATE
Special Requests: ADEQUATE

## 2017-10-06 MED ORDER — ONDANSETRON 4 MG PO TBDP: 4.0000 mg | ORAL_TABLET | Freq: Four times a day (QID) | ORAL | 0 refills | Status: DC | PRN

## 2017-10-06 MED ORDER — OXYCODONE HCL 5 MG PO TABS: 5.0000 mg | ORAL_TABLET | Freq: Four times a day (QID) | ORAL | 0 refills | Status: DC | PRN

## 2017-10-06 NOTE — Progress Notes (Signed)
Patient ID: Mary Williams, female   DOB: 1976/01/31, 42 y.o.   MRN: [id number] 1 Day Post-Op   Subjective: Gradually feeling better.  Still some nausea and pain but definitely improved daily.  Tolerating full liquid diet this morning.  Has been up and walking.  EGD yesterday negative.  Objective: Vital signs in last 24 hours: Temp:  [98.2 F (36.8 C)-98.9 F (37.2 C)] 98.8 F (37.1 C) (08/14 0458) Pulse Rate:  [60-79] 67 (08/14 0458) Resp:  [11-20] 16 (08/14 0458) BP: (111-159)/(58-104) 131/75 (08/14 0458) SpO2:  [97 %-100 %] 97 % (08/14 0458) Weight:  [90.7 kg] 90.7 kg (08/13 1155) Last BM Date: 10/03/17  Intake/Output from previous day: 08/13 0701 - 08/14 0700 In: 2757.7 [P.O.:800; I.V.:1657.7; IV Piggyback:300] Out: 2500 [Urine:2500] Intake/Output this shift: No intake/output data recorded.  General appearance: alert, cooperative and no distress.  Smiling and in better spirits. GI: Minimal tenderness  Lab Results:  No results for input(s): WBC, HGB, HCT, PLT in the last 72 hours. BMET No results for input(s): NA, K, CL, CO2, GLUCOSE, BUN, CREATININE, CALCIUM in the last 72 hours.   Studies/Results: No results found.  Anti-infectives: Anti-infectives (From admission, onward)   None      Assessment/Plan: s/p Procedure(s): ESOPHAGOGASTRODUODENOSCOPY (EGD) WITH PROPOFOL Acute epigastric abdominal pain, nausea and vomiting. Patient with history of sleeve gastrectomy and almost 1 year post conversion to Roux-en-Y gastric bypassfor GERD that is now resolved. Has history of recurrent abdominal pain of uncertain etiology. Status post cholecystectomy. Status post negative laparoscopy and EGD in April of this year. This spring CT scan did suggest some possible enteritis involving small bowel loops in the left lower quadrant. Multiple stool studies were negative and subsequent CT negative. Laparoscopy negative. Evaluation this hospitalization not revealing with  repeat negative CT scan, normal lab work.  EGD yesterday unremarkable. Etiology is not clear to me.  Question gastroenteritis, intestinal spasm or "intestinal migraine,possible overlay of anxiety.  Continued improvement and tolerating fluids.  Okay for discharge today.    LOS: 4 days    Edward Jolly 10/06/2017

## 2017-10-06 NOTE — Progress Notes (Signed)
Discharge and medication instructions reviewed with patient. Questions answered and patient denies further questions. One prescription given to patient. Donne Hazel, RN

## 2017-10-06 NOTE — Discharge Summary (Signed)
Patient ID: NAIKA NOTO [id number] 42 y.o. [birth year]  10/01/2017  Discharge date and time: 10/06/2017   Admitting Physician: Edward Jolly  Discharge Physician: Edward Jolly  Admission Diagnoses: Abdominal pain  Discharge Diagnoses: Same  Operations: Procedure(s): ESOPHAGOGASTRODUODENOSCOPY (EGD) WITH PROPOFOL  Admission Condition: fair  Discharged Condition: good  Indication for Admission: The patient is a 42 year old female presenting status-post bariatric surgery. 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 She had persistent and worsening vomiting and reflux without a clear anatomic cause. Since revision of her reflux and heartburn has been relieved.  She has however had recurrent issues with abdominal pain.  In January of this year she presented with right upper quadrant pain with essentially negative work-up and underwent laparoscopic cholecystectomy for presumed biliary dyskinesia.  She seemed to do pretty well after this for a couple of weeks but then later in the spring she developed persistent left lower quadrant abdominal pain associated with diarrhea.  CT at that time did show possibly some thickening of bowel loops in the left lower quadrant.  She had an extensive GI work-up at that time with multiple stool studies and lab work and GI evaluation by Dr. Paulita Fujita with no definite cause found.  At that time she also developed some epigastric pain with solids or fluids which was associated with lightheadedness and weakness.  Subsequently in April of this year was admitted and underwent diagnostic laparoscopy with a minor adhesion to her jejunojejunostomy of questionable significance but no internal hernia or other abnormalities found.  EGD at that time was also negative.  She seemed to gradually improve following this and  states she has done pretty well for the last several months. However she states this morning she awoke with mild epigastric pain.  Later in the day she felt a little bit nauseated but thought she just might be hungry.  She ate dinner with a small amount of soft stew and after eating a few bites developed fairly rapidly the onset of severe sharp epigastric pain radiating to both upper quadrants.  She describes 10/10 pain.  This was associated with nausea and she vomited twice.  Emesis was dark but not grossly bloody.  She called and I recommended evaluation in the emergency department.  She said she also felt weak and lightheaded and dizzy.  She has had continued mild loose frequent bowel movements over the last month since her cholecystectomy which are unchanged.  No urinary symptoms.  No fever or chills.  She has not had relief of her pain in the emergency department with IV fentanyl.  She describes constant pain with exacerbations.  Hospital Course: Patient was admitted for observation and treatment.  Made n.p.o. and treated with IV fluids, IV Pepcid, pain and nausea medications.  Over the first 48 hours she had no significant improvement although follow-up lab work was negative and vital signs remained unremarkable.  By August 13 she was feeling somewhat better.  GI was consulted for endoscopy which was performed on August 13 which was entirely negative.  Her symptoms continued to improve.  By the date of discharge she was "much better" than admission.  Still some intermittent pain and nausea but tolerating fluids and the patient and I felt she was ready for discharge.  Consults: GI  Significant Diagnostic Studies: endoscopy: gastroscopy: Negative  Treatments: IV hydration and pain and  nausea medication  Disposition: Home  Patient Instructions:  Allergies as of 10/06/2017      Reactions   Fish Allergy Anaphylaxis   Penicillins Anaphylaxis   Has patient had a PCN reaction causing immediate rash,  facial/tongue/throat swelling, SOB or lightheadedness with hypotension: Yes Has patient had a PCN reaction causing severe rash involving mucus membranes or skin necrosis: No Has patient had a PCN reaction that required hospitalization No Has patient had a PCN reaction occurring within the last 10 years: No If all of the above answers are "NO", then may proceed with Cephalosporin use.   Shellfish Allergy Anaphylaxis   Ceclor [cefaclor] Hives      Medication List    STOP taking these medications   CLINDAMYCIN HCL PO     TAKE these medications   acetaminophen 500 MG tablet Commonly known as:  TYLENOL Take 1,000 mg by mouth every 8 (eight) hours as needed for mild pain or headache.   BARIATRIC MULTIVITAMINS/IRON PO Take 1 capsule by mouth daily.   clonazePAM 0.5 MG tablet Commonly known as:  KLONOPIN Take 0.5 mg by mouth daily as needed for anxiety (panic attacks.).   escitalopram 20 MG tablet Commonly known as:  LEXAPRO Take 20 mg by mouth at bedtime.   ondansetron 4 MG disintegrating tablet Commonly known as:  ZOFRAN-ODT Take 1 tablet (4 mg total) by mouth every 6 (six) hours as needed for nausea.   oxyCODONE 5 MG immediate release tablet Commonly known as:  Oxy IR/ROXICODONE Take 1 tablet (5 mg total) by mouth every 6 (six) hours as needed for moderate pain, severe pain or breakthrough pain.   pantoprazole 40 MG tablet Commonly known as:  PROTONIX Take 40 mg by mouth daily after breakfast.       Activity: activity as tolerated Diet: Instructed to limit to fluids and protein shakes for 5 days then gradually advance to soft protein diet Wound Care: none needed  Follow-up:  With Josef Tourigny in 1 month.  Signed: Edward Jolly MD, FACS  10/06/2017, 8:31 AM

## 2017-10-06 NOTE — Discharge Instructions (Signed)
Call office as needed for concerns.  No activity limitations. Liquid diet (bariatric shakes) only until next week and then gradually resume soft protein diet.

## 2018-03-27 IMAGING — CR DG CHEST 2V
2 series · 2 of 2 positions shown · non-contrast
Comparison: 09/12/2015

CLINICAL DATA: Preop gastric sleeve revision

EXAM:
CHEST  2 VIEW

[w chest pa]
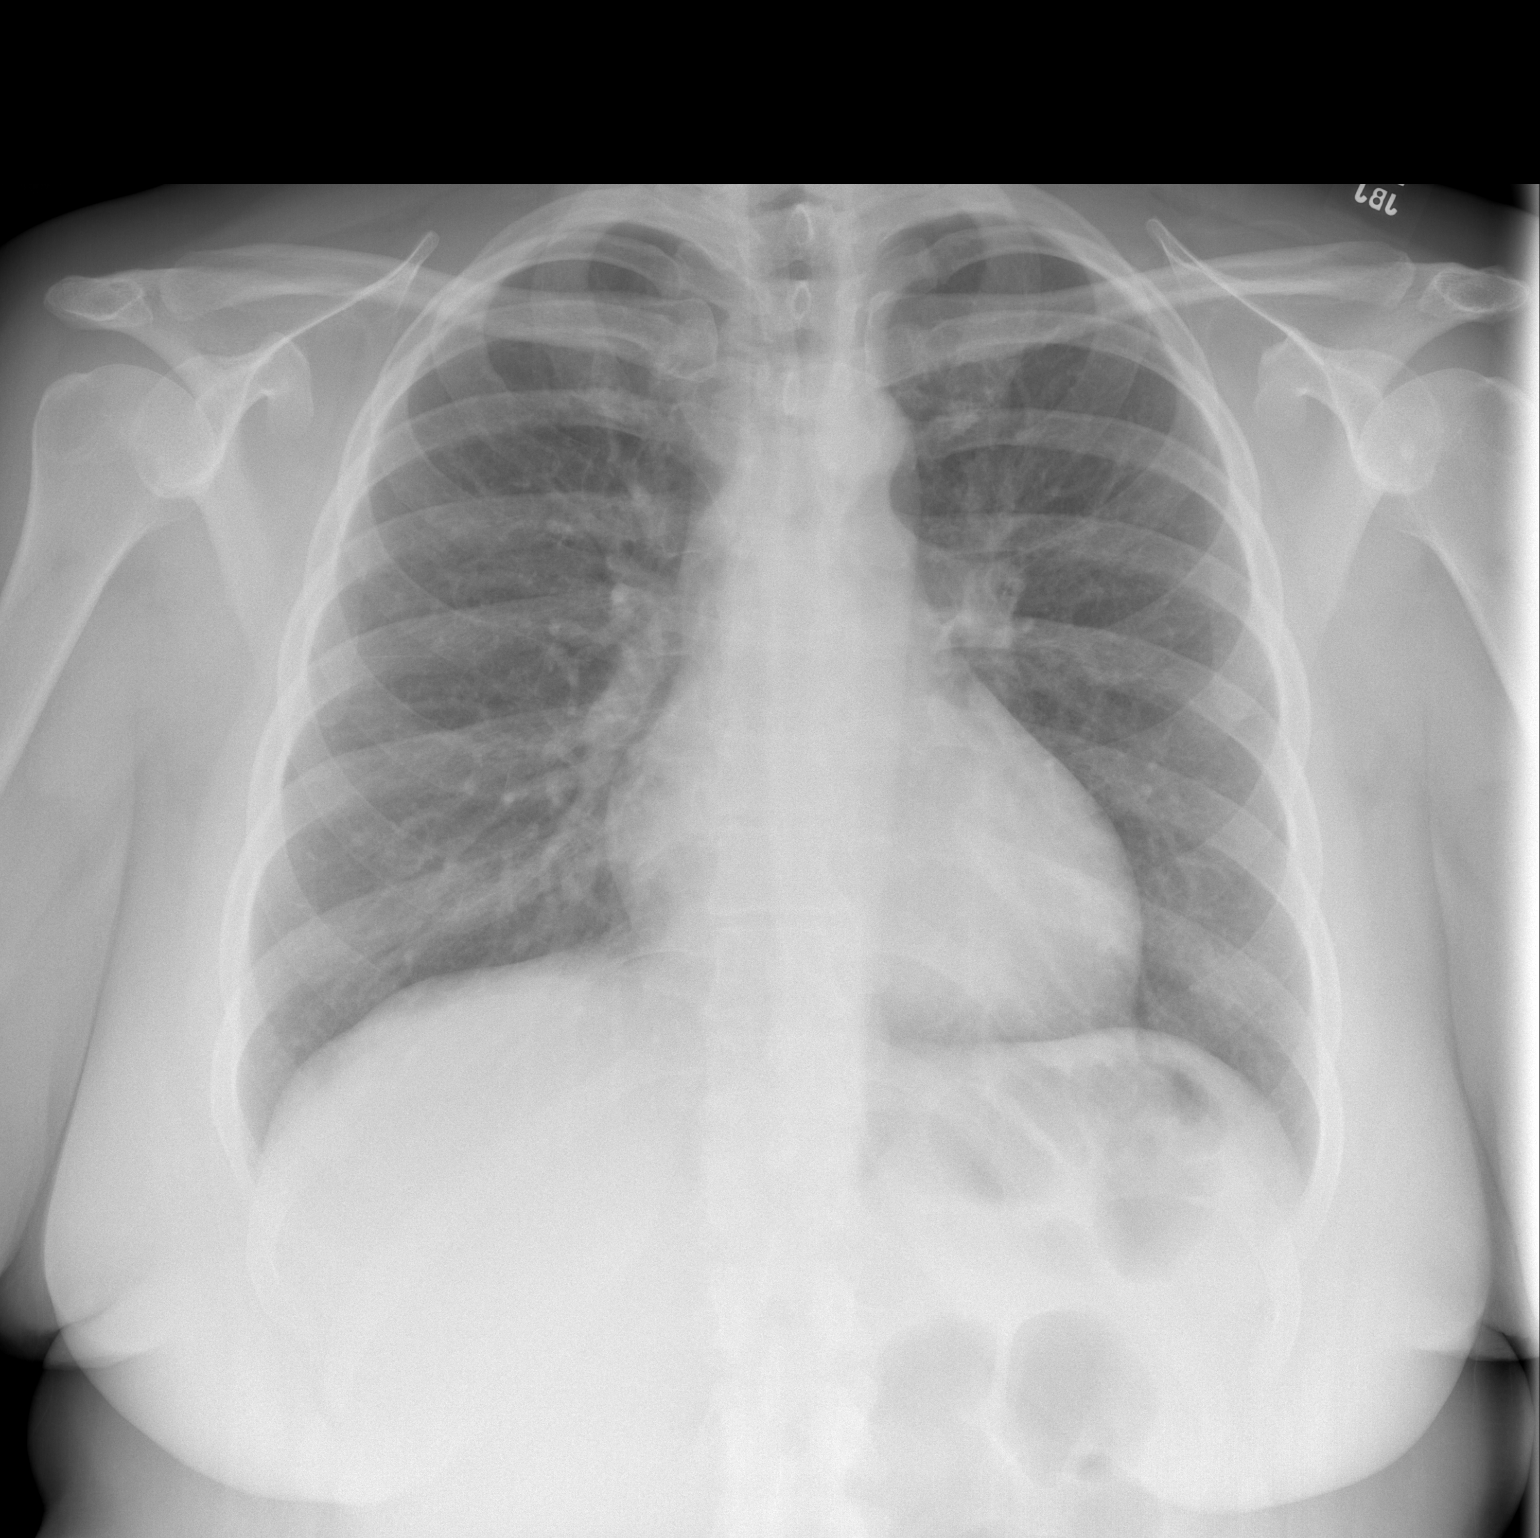

[w chest lat]
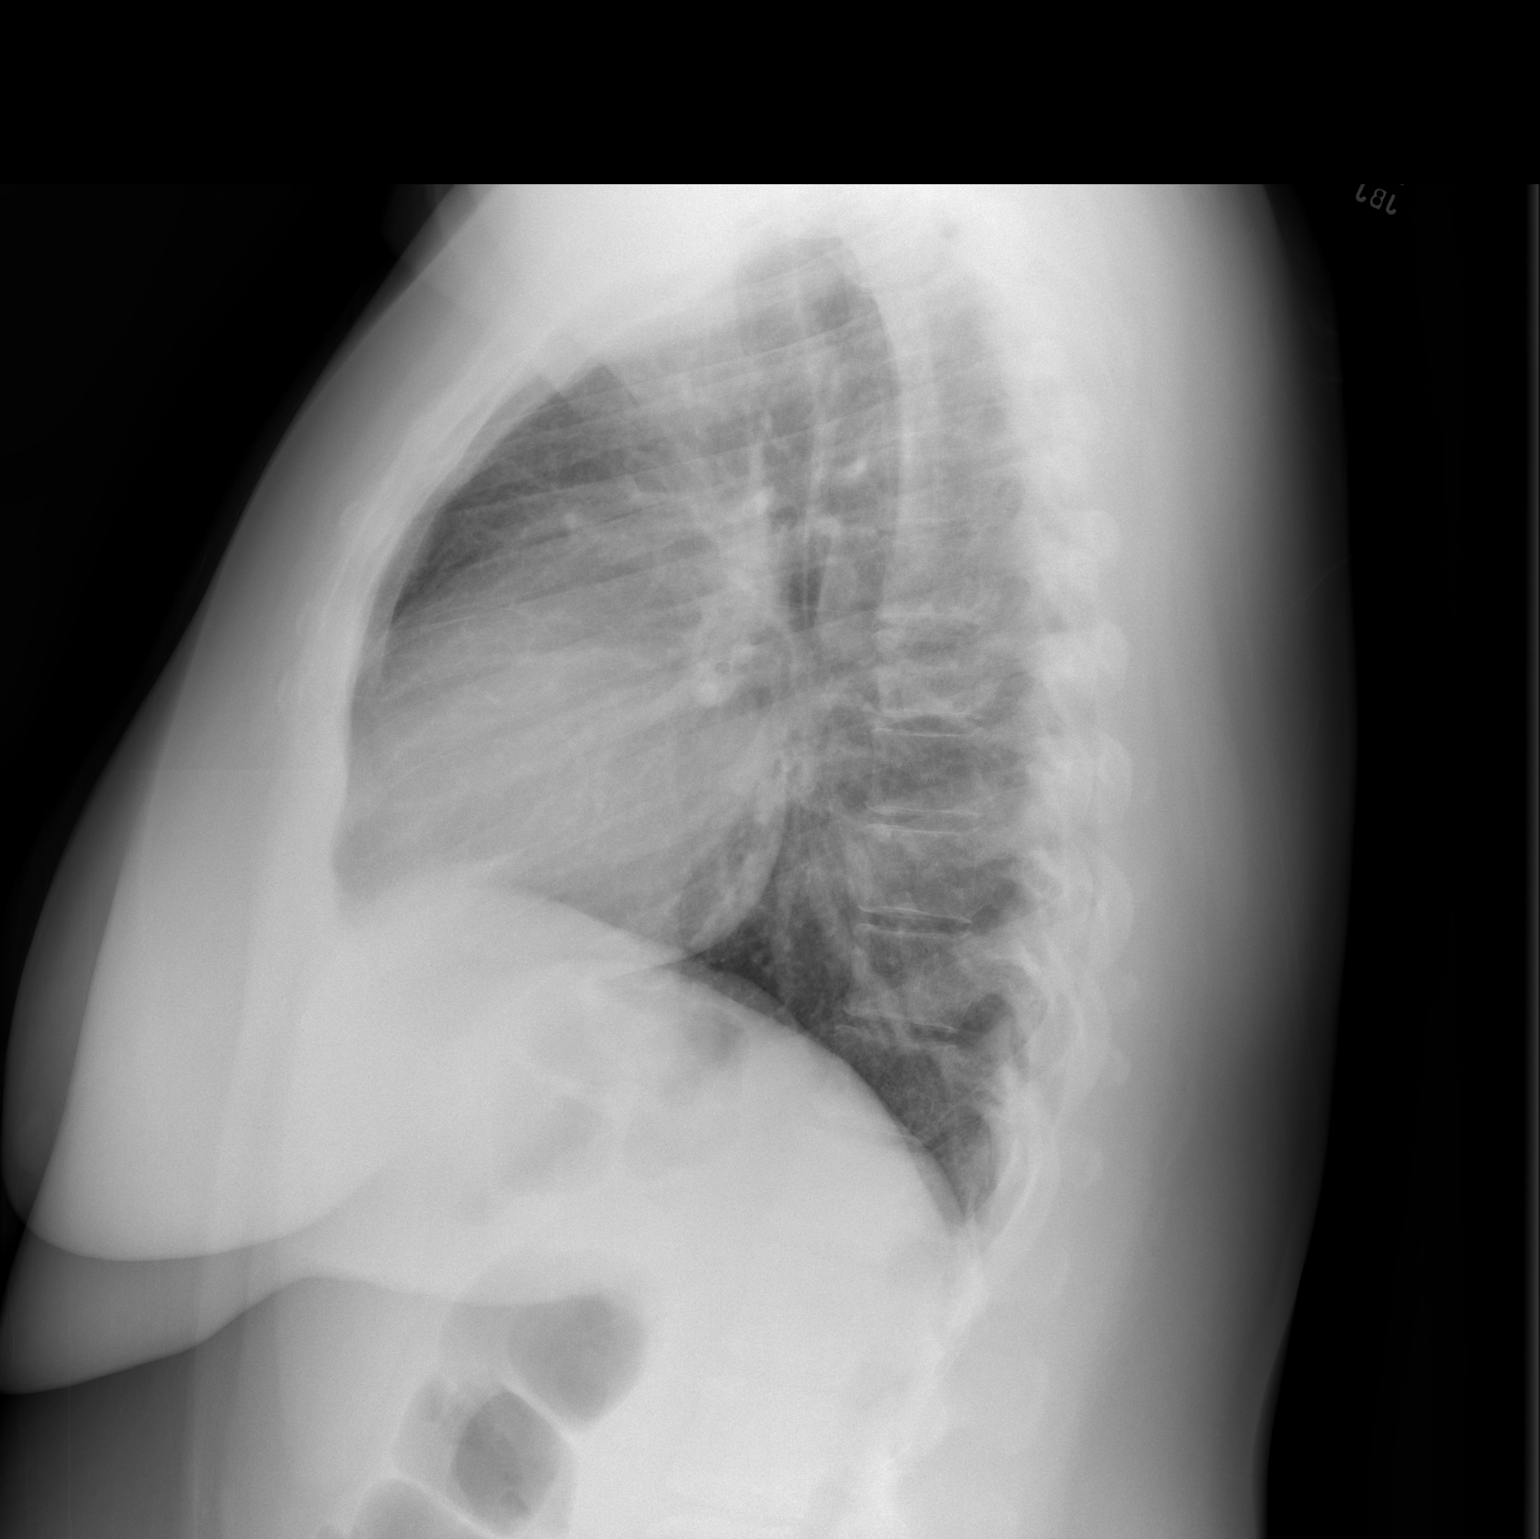

[2 of 2 positions shown; findings below may reference images not displayed]

FINDINGS: The heart size and mediastinal contours are within normal limits.
Both lungs are clear. The visualized skeletal structures are
unremarkable.
IMPRESSION: No active cardiopulmonary disease.

## 2019-05-31 ENCOUNTER — Encounter: Payer: Medicaid Other | Attending: Internal Medicine | Admitting: Dietician

## 2019-05-31 ENCOUNTER — Encounter: Payer: Self-pay | Admitting: Dietician

## 2019-05-31 ENCOUNTER — Other Ambulatory Visit: Payer: Self-pay

## 2019-05-31 DIAGNOSIS — Z9884 Bariatric surgery status: Secondary | ICD-10-CM | POA: Diagnosis not present

## 2019-05-31 NOTE — Patient Instructions (Addendum)
Try drinks with natural flavors such as:   Water with fruit   Amazing Grass brand water flavor tablets   Nuun brand water flavor tablets   If you would like to track your food and fluid intake, try using an app such as MyFitness Pal or Baritastic.   This website has bariatric-friendly products, recipes, etc:  CreditSplash[employer][email or web address]   Practice not drinking before, during, and after meals/snacks

## 2019-05-31 NOTE — Progress Notes (Signed)
Bariatric Nutrition Follow-Up Visit Medical Nutrition Therapy  Appt Start Time: 7:30am    End Time: 8:30am  Surgery Date: RYGB (2018), Sleeve (2017)   NUTRITION ASSESSMENT  Anthropometrics   Body Composition Scale 01/12/2017 03/11/2017 05/31/2019  Weight  lbs 225.2 205.4 217.1  BMI 38.7 35.3 37.1  Total Body Fat  %   41.4     Visceral Fat   12  Fat-Free Mass  %   58.5     Total Body Water  %   43.7     Muscle-Mass  lbs   31.7  Body Fat Displacement   ---         Torso  lbs   55.6         Left Leg  lbs   11.1         Right Leg  lbs   11.1         Left Arm  lbs   5.5         Right Arm  lbs   5.5    Lifestyle & Dietary Hx Works as a Control and instrumentation engineer, and gets in ~10,000 steps/day. Patient states she has gotten "off track" and has regained weight since having bariatric surgery. Typical meal pattern is 2-3 meals plus 1-2 snacks per day. States she can't do chicken or spaghetti noodles because they make her sick. Allergic to seafood. Does not track intake but tries to stick with "low calorie" snacks. Does not eat many vegetables or fruits. Will drink fluids with meals, and occasionally drinks half and half tea and soda. Will have coffee daily as well as water, and is switching to sparkling water and diet sodas to satisfy carbonation cravings. States she has protein powder, and we discussed protein shake and smoothie options.   24-Hr Dietary Recall First Meal: 7:30am 1.5-2 [place] Clap crustless egg bites  Snack: 100 kcal Light & Fit Greek yogurt  (or raisins, cheese, & nut packs)  Second Meal: skips (or the raisins, cheese, & nut packs)  Snack: granola bar  Third Meal: pork loin (or 85/15 hamburger, or chicken) + green beans (or broccoli) + sometimes starch Snack: - Beverages: 1/2 sweet 1/2 unsweet tea, Coke Zero, water, Crystal Light, flavored sparkling water, coffee  Estimated daily fluid intake: 64+ oz water, 20 oz coffee Estimated daily protein intake: 60+ g Supplements:  none Current average weekly physical activity: walking   Post-Op Goals/ Signs/ Symptoms Using straws: sometimes  Drinking while eating: yes  Chewing/swallowing difficulties: no Changes in vision: no Changes to mood/headaches: no Hair loss/changes to skin/nails: no Difficulty focusing/concentrating: no Sweating: no Dizziness/lightheadedness: no Palpitations: no  Carbonated/caffeinated beverages: coffee, diet soda, sparkling water N/V/D/C/Gas: vomiting (occasionally), diarrhea (daily) Abdominal pain: no Dumping syndrome: no   NUTRITION DIAGNOSIS  Overweight/obesity ([place]yer-3.3) related to past poor dietary habits and physical inactivity as evidenced by completed bariatric surgery and following dietary guidelines for continued weight loss and healthy nutrition status.   NUTRITION INTERVENTION Nutrition counseling (C-1) and education (E-2) to facilitate bariatric surgery goals, including: . Diet education on key nutrition concepts for lifelong weight maintenance: protein (minimum of 60 grams per day), balanced eating including complex carbohydrates and plenty of non-starchy vegetables, adequate fluid intake and appropriate fluids, proper eating habits including not drinking with meals, and bariatric supplementation of MVI and calcium . The importance of consuming adequate calories as well as certain nutrients daily due to the body's need for essential vitamins, minerals, and fats . The importance of daily physical  activity and to reach a goal of at least 150 minutes of moderate to vigorous physical activity weekly (or as directed by their physician) due to benefits such as increased musculature and improved lab values  Handouts Provided Include   Phase 7: Lifelong Maintenance   Bariatric MyPlate   Sample 7-Day 1,500kcal Bariatric Meal Plan  Bariatric Vitamins & Minerals   Goals  Practice not drinking before, during, and after meals/snacks  Monitor caffeine and carbonated beverage  intake as well as artificially sweetened beverages for diarrhea (try naturally flavored beverage options discussed)   Consider taking a bariatric multivitamin and calcium supplements daily   Consider tracking food/beverage intake to get a snapshot of your typical daily intake for calories, protein, and fluid   Learning Style & Readiness for Change Teaching method utilized: Visual & Auditory  Demonstrated degree of understanding via: Teach Back  Barriers to learning/adherence to lifestyle change: None Identified    MONITORING & EVALUATION Dietary intake, weekly physical activity, body weight, and goals in 6 weeks.  Next Steps Patient is to follow-up in 6 weeks for bariatric nutrition follow-up.

## 2019-07-12 ENCOUNTER — Ambulatory Visit: Payer: Medicaid Other | Admitting: Dietician

## 2019-08-22 ENCOUNTER — Emergency Department (HOSPITAL_COMMUNITY): Payer: Medicaid Other

## 2019-08-22 ENCOUNTER — Emergency Department (HOSPITAL_COMMUNITY)
Admission: EM | Admit: 2019-08-22 | Discharge: 2019-08-23 | Disposition: A | Discharge status: Home / Self Care | Payer: Medicaid Other | Attending: Emergency Medicine | Admitting: Emergency Medicine

## 2019-08-22 ENCOUNTER — Encounter (HOSPITAL_COMMUNITY): Payer: Self-pay | Admitting: Physician Assistant

## 2019-08-22 DIAGNOSIS — S27321A Contusion of lung, unilateral, initial encounter: Secondary | ICD-10-CM | POA: Insufficient documentation

## 2019-08-22 DIAGNOSIS — Y92410 Unspecified street and highway as the place of occurrence of the external cause: Secondary | ICD-10-CM | POA: Insufficient documentation

## 2019-08-22 DIAGNOSIS — T07XXXA Unspecified multiple injuries, initial encounter: Secondary | ICD-10-CM

## 2019-08-22 DIAGNOSIS — Y999 Unspecified external cause status: Secondary | ICD-10-CM | POA: Diagnosis not present

## 2019-08-22 DIAGNOSIS — R55 Syncope and collapse: Secondary | ICD-10-CM | POA: Insufficient documentation

## 2019-08-22 DIAGNOSIS — R109 Unspecified abdominal pain: Secondary | ICD-10-CM | POA: Insufficient documentation

## 2019-08-22 DIAGNOSIS — Y93I9 Activity, other involving external motion: Secondary | ICD-10-CM | POA: Insufficient documentation

## 2019-08-22 DIAGNOSIS — M79606 Pain in leg, unspecified: Secondary | ICD-10-CM | POA: Diagnosis not present

## 2019-08-22 DIAGNOSIS — S299XXA Unspecified injury of thorax, initial encounter: Secondary | ICD-10-CM | POA: Diagnosis present

## 2019-08-22 LAB — CBC WITH DIFFERENTIAL/PLATELET
Abs Immature Granulocytes: 0.07 10*3/uL (ref 0.00–0.07)
Basophils Absolute: 0.1 10*3/uL (ref 0.0–0.1)
Basophils Relative: 1 %
Eosinophils Absolute: 0.3 10*3/uL (ref 0.0–0.5)
Eosinophils Relative: 3 %
HCT: 38.7 % (ref 36.0–46.0)
Hemoglobin: 12.2 g/dL (ref 12.0–15.0)
Immature Granulocytes: 1 %
Lymphocytes Relative: 29 %
Lymphs Abs: 3 10*3/uL (ref 0.7–4.0)
MCH: 28.2 pg (ref 26.0–34.0)
MCHC: 31.5 g/dL (ref 30.0–36.0)
MCV: 89.6 fL (ref 80.0–100.0)
Monocytes Absolute: 1 10*3/uL (ref 0.1–1.0)
Monocytes Relative: 10 %
Neutro Abs: 5.9 10*3/uL (ref 1.7–7.7)
Neutrophils Relative %: 56 %
Platelets: 297 10*3/uL (ref 150–400)
RBC: 4.32 MIL/uL (ref 3.87–5.11)
RDW: 13.3 % (ref 11.5–15.5)
WBC: 10.4 10*3/uL (ref 4.0–10.5)
nRBC: 0 % (ref 0.0–0.2)

## 2019-08-22 LAB — I-STAT BETA HCG BLOOD, ED (MC, WL, AP ONLY): I-stat hCG, quantitative: <5 m[IU]/mL (ref <5)

## 2019-08-22 LAB — ABO/RH: ABO/RH(D): O POS

## 2019-08-22 LAB — TYPE AND SCREEN
ABO/RH(D): O POS
Antibody Screen: NEGATIVE

## 2019-08-22 LAB — COMPREHENSIVE METABOLIC PANEL
ALT: 21 U/L (ref 0–44)
AST: 34 U/L (ref 15–41)
Albumin: 3.5 g/dL (ref 3.5–5.0)
Alkaline Phosphatase: 59 U/L (ref 38–126)
Anion gap: 8 (ref 5–15)
BUN: 9 mg/dL (ref 6–20)
CO2: 24 mmol/L (ref 22–32)
Calcium: 8.9 mg/dL (ref 8.9–10.3)
Chloride: 107 mmol/L (ref 98–111)
Creatinine, Ser: 0.84 mg/dL (ref 0.44–1.00)
GFR calc Af Amer: >60 mL/min (ref >60)
GFR calc non Af Amer: >60 mL/min (ref >60)
Glucose, Bld: 95 mg/dL (ref 70–99)
Potassium: 3.9 mmol/L (ref 3.5–5.1)
Sodium: 139 mmol/L (ref 135–145)
Total Bilirubin: 0.3 mg/dL (ref 0.3–1.2)
Total Protein: 6.2 g/dL — ABNORMAL LOW (ref 6.5–8.1)

## 2019-08-22 LAB — URINALYSIS, ROUTINE W REFLEX MICROSCOPIC
Bilirubin Urine: NEGATIVE
Glucose, UA: NEGATIVE mg/dL
Hgb urine dipstick: NEGATIVE
Ketones, ur: NEGATIVE mg/dL
Leukocytes,Ua: NEGATIVE
Nitrite: NEGATIVE
Protein, ur: NEGATIVE mg/dL
Specific Gravity, Urine: 1.021 (ref 1.005–1.030)
pH: 5 (ref 5.0–8.0)

## 2019-08-22 LAB — I-STAT CHEM 8, ED
BUN: 10 mg/dL (ref 6–20)
Calcium, Ion: 1.09 mmol/L — ABNORMAL LOW (ref 1.15–1.40)
Chloride: 106 mmol/L (ref 98–111)
Creatinine, Ser: 0.9 mg/dL (ref 0.44–1.00)
Glucose, Bld: 96 mg/dL (ref 70–99)
HCT: 35 % — ABNORMAL LOW (ref 36.0–46.0)
Hemoglobin: 11.9 g/dL — ABNORMAL LOW (ref 12.0–15.0)
Potassium: 3.7 mmol/L (ref 3.5–5.1)
Sodium: 140 mmol/L (ref 135–145)
TCO2: 25 mmol/L (ref 22–32)

## 2019-08-22 LAB — PROTIME-INR
INR: 0.9 (ref 0.8–1.2)
Prothrombin Time: 11.9 seconds (ref 11.4–15.2)

## 2019-08-22 LAB — LIPASE, BLOOD: Lipase: 30 U/L (ref 11–51)

## 2019-08-22 LAB — ETHANOL: Alcohol, Ethyl (B): <10 mg/dL (ref <10)

## 2019-08-22 MED ORDER — ONDANSETRON HCL 4 MG/2ML IJ SOLN
4.0000 mg | Freq: Once | INTRAMUSCULAR | Status: AC
  Administered 2019-08-22: 4 mg via INTRAVENOUS
  Filled 2019-08-22: qty 2

## 2019-08-22 MED ORDER — IOHEXOL 300 MG/ML  SOLN
100.0000 mL | Freq: Once | INTRAMUSCULAR | Status: AC | PRN
  Administered 2019-08-22: 100 mL via INTRAVENOUS

## 2019-08-22 MED ORDER — FENTANYL CITRATE (PF) 100 MCG/2ML IJ SOLN
50.0000 ug | Freq: Once | INTRAMUSCULAR | Status: AC
  Administered 2019-08-22: 50 ug via INTRAVENOUS
  Filled 2019-08-22: qty 2

## 2019-08-22 NOTE — Progress Notes (Signed)
Orthopedic Tech Progress Note Patient Details:  Mary Williams September 30, 1975 [id number] Level 2 Trauma not needed  Patient ID: Mary Williams, female   DOB: Jan 18, 1976, 44 y.o.   MRN: [id number]   Mary Williams 08/22/2019, 9:11 PM
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
## 2019-08-22 NOTE — Discharge Instructions (Addendum)
Today you received medications that may make you sleepy or impair your ability to make decisions.  For the next 24 hours please do not drive, operate heavy machinery, care for a small child with out another adult present, or perform any activities that may cause harm to you or someone else if you were to fall asleep or be impaired.   Please use the incentive spirometer every hour while awake.  Please use it 10 times.
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
## 2019-08-22 NOTE — ED Notes (Signed)
Patient transported to CT 

## 2019-08-22 NOTE — ED Notes (Signed)
Patient transported to X-ray 
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
## 2019-08-22 NOTE — ED Notes (Signed)
Pt able to ambulate to and from bathroom without assistance. Endorsing right leg pain, with a slight limp

## 2019-08-23 ENCOUNTER — Encounter: Payer: Self-pay | Admitting: Dietician

## 2019-08-23 MED ORDER — OXYCODONE-ACETAMINOPHEN 5-325 MG PO TABS: 1.0000 | ORAL_TABLET | ORAL | 0 refills | Status: DC | PRN

## 2019-08-23 MED ORDER — METHOCARBAMOL 500 MG PO TABS: 1000.0000 mg | ORAL_TABLET | Freq: Two times a day (BID) | ORAL | 0 refills | Status: DC

## 2019-08-23 MED ORDER — ONDANSETRON 4 MG PO TBDP
4.0000 mg | ORAL_TABLET | Freq: Three times a day (TID) | ORAL | 0 refills | Status: DC | PRN
Start: 2019-08-23 — End: 2020-09-02

## 2019-08-23 NOTE — ED Notes (Signed)
Sign -pad broken upon departure. Pt provided with and verbalizes understanding of discharge instructions. Wrist-band removed upon departure. A&ox4, and ambulatory.

## 2020-09-02 ENCOUNTER — Ambulatory Visit: Payer: Medicaid Other | Admitting: Gastroenterology

## 2020-09-02 ENCOUNTER — Encounter: Payer: Self-pay | Admitting: Gastroenterology

## 2020-09-02 VITALS — BP 148/100 | HR 92 | Ht 64.0 in | Wt 205.0 lb

## 2020-09-02 DIAGNOSIS — Z9884 Bariatric surgery status: Secondary | ICD-10-CM | POA: Diagnosis not present

## 2020-09-02 DIAGNOSIS — R933 Abnormal findings on diagnostic imaging of other parts of digestive tract: Secondary | ICD-10-CM | POA: Diagnosis not present

## 2020-09-02 DIAGNOSIS — R1310 Dysphagia, unspecified: Secondary | ICD-10-CM | POA: Diagnosis not present

## 2020-09-02 DIAGNOSIS — R1032 Left lower quadrant pain: Secondary | ICD-10-CM

## 2020-09-02 DIAGNOSIS — R197 Diarrhea, unspecified: Secondary | ICD-10-CM

## 2020-09-02 MED ORDER — PLENVU 140 G PO SOLR: ORAL | 0 refills | Status: AC

## 2020-09-02 NOTE — Patient Instructions (Signed)
If you are age 44 or older, your body mass index should be between 23-30. Your Body mass index is 35.19 kg/m. If this is out of the aforementioned range listed, please consider follow up with your Primary Care Provider.  If you are age 43 or younger, your body mass index should be between 19-25. Your Body mass index is 35.19 kg/m. If this is out of the aformentioned range listed, please consider follow up with your Primary Care Provider.   You have been scheduled for an endoscopy and colonoscopy. Please follow the written instructions given to you at your visit today. Please pick up your prep supplies at the pharmacy within the next 1-3 days. If you use inhalers (even only as needed), please bring them with you on the day of your procedure.   The [facility] providers would like to encourage you to use [facility] to communicate with providers for non-urgent requests or questions.  Due to long hold times on the telephone, sending your provider a message by [facility] may be a faster and more efficient way to get a response.  Please allow 48 business hours for a response.  Please remember that this is for non-urgent requests.   It was a pleasure to see you today!  Thank you for trusting me with your gastrointestinal care!     Scott E. Candis Schatz, MD

## 2020-09-02 NOTE — Progress Notes (Signed)
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 The patient states that she has been dealing with significant abdominal pain for years now, although the character and location of the pain has varied somewhat.  For the past year, she has had significant LLQ pain which is near constant, but which worsens after she eats.  The pain can be severe and is significantly impairing her quality of life.  The pain is sharp in character and last for hours.  During that time, it will increase and decrease intensity.  The pain never seems to completely go away. In 2019, the patient had multiple ER visits and admissions for abdominal pain.  She underwent an ex lap in August 2019 where some jejunojejunostomy adhesions were released, but otherwise no abnormalities were found.  She had multiple negative upper endoscopies during this time period as well.  Patient states that her current pain is similar to the pain she was having in 2019, but states this pain is even worse.  In the past, she has taken Bentyl, Protonix, Tylenol and heat packs, and none of these seem to help with her symptoms. In addition to the pain, patient has dysphagia and frequent nausea and vomiting after meals.  She feels that any solid food gets stuck, and if she does not vomit she will have extreme nausea.  Vomiting helps relieve this nausea.  On average, she is vomiting about twice a day, usually within 5 to 10 minutes of eating.  She does not have this problem with liquids.  She denies heartburn or acid regurgitation.  This has been going off and on for the last year but has been persistent in the past 2 months.  She has not lost consistently, rather her weight continues to fluctuate. She also is bothered by chronic diarrhea.  She  has the urge to defecate immediately after meals.  On average, she will have 3 bowel movements per day.  Her stools are almost exclusively loose or watery.  Bowel movements do not improve her abdominal pain.  She denies nocturnal bowel movements.  No blood in the stool.  She has been evaluated by GI providers in the past.  She saw a provider [facility] a few years ago, but she does not have those records with her and they are not available for review at this time.  More recently, she had a barium swallow at [facility] about a month ago which is significant for retained 13 mm barium tablet and a reproduction of her symptoms.  She has never had a colonoscopy.  Her last upper endoscopy was in August 2019 was hospitalized for abdominal pain.  This was reportedly unremarkable.   Past Medical History:  Diagnosis Date   Anxiety    GERD (gastroesophageal reflux disease)    History of hiatal hernia    Molar pregnancy yrs ago   mOLAR PREGNACY CANCEROUS REMOVED   Pneumonia yrs ago     Past Surgical History:  Procedure Laterality Date   ABDOMINAL HYSTERECTOMY     ovaries left   APPENDECTOMY     CHOLECYSTECTOMY N/A 02/23/2017   Procedure: LAPAROSCOPIC CHOLECYSTECTOMY;  Surgeon: Michael Boston, MD;  Location: [facility];  Service: General;  Laterality: N/A;   DILATION AND CURETTAGE OF UTERUS     ESOPHAGOGASTRODUODENOSCOPY  possible dilation 12/14/16 Dr. Lucia Gaskins   ESOPHAGOGASTRODUODENOSCOPY N/A 02/23/2017   Procedure: ESOPHAGOGASTRODUODENOSCOPY (EGD);  Surgeon: Michael Boston, MD;  Location: [facility];  Service: General;  Laterality: N/A;   ESOPHAGOGASTRODUODENOSCOPY N/A 06/14/2017   Procedure: ESOPHAGOGASTRODUODENOSCOPY (EGD);  Surgeon: Excell Seltzer, MD;  Location: [facility];  Service: General;  Laterality: N/A;   ESOPHAGOGASTRODUODENOSCOPY (EGD) WITH PROPOFOL N/A 12/14/2016   Procedure: ESOPHAGOGASTRODUODENOSCOPY (EGD) WITH PROPOFOL WITH POSSIBLE DILATATION;  Surgeon: Alphonsa Overall, MD;  Location:  [facility];  Service: General;  Laterality: N/A;   ESOPHAGOGASTRODUODENOSCOPY (EGD) WITH PROPOFOL N/A 10/05/2017   Procedure: ESOPHAGOGASTRODUODENOSCOPY (EGD) WITH PROPOFOL;  Surgeon: Wonda Horner, MD;  Location: [facility];  Service: Endoscopy;  Laterality: N/A;   HERNIA REPAIR     LAPAROSCOPIC CHOLECYSTECTOMY SINGLE SITE WITH INTRAOPERATIVE CHOLANGIOGRAM N/A 02/23/2017   Procedure: DIAGNOSTIC LAPAROSCOPY;  Surgeon: Michael Boston, MD;  Location: [facility];  Service: General;  Laterality: N/A;   LAPAROSCOPIC GASTRIC SLEEVE RESECTION WITH HIATAL HERNIA REPAIR N/A 11/12/2015   Procedure: LAPAROSCOPIC GASTRIC SLEEVE RESECTION WITH HIATAL HERNIA REPAIR WITH UPPER ENDO;  Surgeon: Excell Seltzer, MD;  Location: [facility];  Service: General;  Laterality: N/A;   LAPAROSCOPIC ROUX-EN-Y GASTRIC BYPASS WITH HIATAL HERNIA REPAIR  12/29/2016   Procedure: LAPAROSCOPIC ROUX-EN-Y GASTRIC BYPASS WITH HIATAL HERNIA REPAIR;  Surgeon: Excell Seltzer, MD;  Location: [facility];  Service: General;;   LAPAROSCOPY N/A 07/29/2016   Procedure: LAPAROSCOPY DIAGNOSTIC with appendectomy and lysis of adhesions;  Surgeon: Judeth Horn, MD;  Location: [facility];  Service: General;  Laterality: N/A;   LAPAROSCOPY N/A 06/14/2017   Procedure: LAPAROSCOPY DIAGNOSTIC ERAS PATHWAY;  Surgeon: Excell Seltzer, MD;  Location: [facility];  Service: General;  Laterality: N/A;   UPPER GI ENDOSCOPY  11/12/2015   Procedure: UPPER GI ENDOSCOPY;  Surgeon: Excell Seltzer, MD;  Location: [facility];  Service: General;;   UPPER GI ENDOSCOPY  12/29/2016   Procedure: UPPER GI ENDOSCOPY;  Surgeon: Excell Seltzer, MD;  Location: [facility];  Service: General;;   Family History  Problem Relation Age of Onset   Diabetes Father    Heart disease Father    Diabetes Maternal Grandmother    Diabetes Paternal Grandmother    Heart disease Paternal Grandfather    Hypertension Other    Asthma Other    Cancer Other    Diabetes Other    Kidney disease Daughter     Asthma Daughter    Social History   Tobacco Use   Smoking status: Never   Smokeless tobacco: Never  Vaping Use   Vaping Use: Never used  Substance Use Topics   Alcohol use: Yes    Comment: occasional   Drug use: No   Current Outpatient Medications  Medication Sig Dispense Refill   hydrOXYzine (ATARAX/VISTARIL) 25 MG tablet Take 25-50 mg by mouth as needed.     methocarbamol (ROBAXIN) 500 MG tablet Take 2 tablets (1,000 mg total) by mouth 2 (two) times daily. 20 tablet 0   Multiple Vitamins-Minerals (BARIATRIC MULTIVITAMINS/IRON PO) Take 1 capsule by mouth daily.     pantoprazole (PROTONIX) 40 MG tablet Take 40 mg by mouth daily after breakfast.      PEG-KCl-NaCl-NaSulf-Na Asc-C (PLENVU) 140 g SOLR Use as directed. Manufacturer's coupon Universal coupon code:BIN: P2366821; GROUP: [id number]; PCN: [id number]; ID: [id number]; PAY NO MORE $50; NO prior authorization 1 each 0   phentermine (ADIPEX-P) 37.5 MG tablet Take 37.5 mg by mouth every morning.     sertraline (ZOLOFT) 100 MG tablet Take 100 mg by  mouth daily.     traMADol (ULTRAM) 50 MG tablet Take 50 mg by mouth as needed.     VRAYLAR 4.5 MG CAPS Take 1 capsule by mouth daily.     No current facility-administered medications for this visit.   Allergies  Allergen Reactions   Fish Allergy Anaphylaxis   Penicillins Anaphylaxis    Has patient had a PCN reaction causing immediate rash, facial/tongue/throat swelling, SOB or lightheadedness with hypotension: Yes Has patient had a PCN reaction causing severe rash involving mucus membranes or skin necrosis: No Has patient had a PCN reaction that required hospitalization No Has patient had a PCN reaction occurring within the last 10 years: No If all of the above answers are "NO", then may proceed with Cephalosporin use.    Shellfish Allergy Anaphylaxis   Ceclor [Cefaclor] Hives     Review of Systems: All systems reviewed and negative except where noted in HPI.    No results  found.  Physical Exam: BP (!) 148/100 (BP Location: Left Arm, Patient Position: Sitting, Cuff Size: Normal)   Pulse 92   Ht 5\' 4"  (1.626 m) Comment: height measured without shoes  Wt 205 lb (93 kg)   BMI 35.19 kg/m  Constitutional: Pleasant,well-developed, Caucasian female in no acute distress. HEENT: Normocephalic and atraumatic. Conjunctivae are normal. No scleral icterus. Cardiovascular: Normal rate, regular rhythm.  Pulmonary/chest: Effort normal and breath sounds normal. No wheezing, rales or rhonchi. Abdominal: Soft, nondistended, diffuse tenderness to very light palpation in the left lower quadrant, left upper quadrant and epigastrium, no rigidity or guarding. Bowel sounds active throughout. There are no masses palpable. No hepatomegaly.  Well-healed laparoscopic port scars Extremities: no edema Neurological: Alert and oriented to person place and time. Skin: Skin is warm and dry. No rashes noted. Psychiatric: Normal mood and affect. Behavior is normal.  CBC    Component Value Date/Time   WBC 10.4 08/22/2019 2042   RBC 4.32 08/22/2019 2042   HGB 12.2 08/22/2019 2042   HCT 38.7 08/22/2019 2042   PLT 297 08/22/2019 2042   MCV 89.6 08/22/2019 2042   MCH 28.2 08/22/2019 2042   MCHC 31.5 08/22/2019 2042   RDW 13.3 08/22/2019 2042   LYMPHSABS 3.0 08/22/2019 2042   MONOABS 1.0 08/22/2019 2042   EOSABS 0.3 08/22/2019 2042   BASOSABS 0.1 08/22/2019 2042    CMP     Component Value Date/Time   NA 139 08/22/2019 2042   K 3.9 08/22/2019 2042   CL 107 08/22/2019 2042   CO2 24 08/22/2019 2042   GLUCOSE 95 08/22/2019 2042   BUN 9 08/22/2019 2042   CREATININE 0.84 08/22/2019 2042   CALCIUM 8.9 08/22/2019 2042   PROT 6.2 (L) 08/22/2019 2042   ALBUMIN 3.5 08/22/2019 2042   AST 34 08/22/2019 2042   ALT 21 08/22/2019 2042   ALKPHOS 59 08/22/2019 2042   BILITOT 0.3 08/22/2019 2042   GFRNONAA >60 08/22/2019 2042   GFRAA >60 08/22/2019 2042   CLINICAL DATA: Pain and difficulty  swallowing for approximately 1 year. History of gastric bypass 3 years ago.  EXAM: UPPER GI SERIES WITH KUB  TECHNIQUE: After obtaining a scout radiograph a routine upper GI series was performed using thin and high density barium.  FLUOROSCOPY TIME: Fluoroscopy Time: 1 minutes, 48 seconds.  Radiation Exposure Index (if provided by the fluoroscopic device): 42.8 mGy  Number of Acquired Spot Images: 10  COMPARISON: Upper GI series 10/05/2019.  FINDINGS: Scout film of the abdomen demonstrates a nonobstructive bowel  gas pattern. Cholecystectomy clips are noted. There is a clip in the left upper quadrant the abdomen which is likely a dropped clip. Suture material related to gastric bypass surgery noted. No acute or focal bony abnormality. No unexpected abdominal calcification.  Swallowing function was normal without aspiration or penetration. No esophageal mass or inflammatory change is identified. Small distal esophageal diverticulum noted. No hiatal hernia was seen and no gastroesophageal reflux was elicited. There was stasis of barium in the esophagus. A 13 mm barium tablet lodged at the gastroesophageal junction and did not pass into the stomach with repeated swallows of water and thick barium. Small gastric pouch is unchanged in appearance. Contrast passed into the efferent loop and more distal small bowel loops.  IMPRESSION: 13 mm barium tablet lodged at the gastroesophageal junction and did not pass into the stomach with repeated swallows of water and thick barium. The patient experienced discomfort when the pill lodged which mimicked her symptoms.  Esophageal dysmotility.  Status post gastric bypass.  Small distal esophageal diverticulum.   Electronically Signed By: Inge Rise M.D. On: 08/06/2020 09:08  ASSESSMENT AND PLAN: 45 year old female status post gastric sleeve converted to Roux-en-Y with chronic abdominal pain, diarrhea, dysphagia with nausea and  vomiting.  In the past, she has had multiple upper endoscopies to evaluate the symptoms which have all been unremarkable.  She did have some surgical adhesive disease on previous ex lap's.  It is possible that her current abdominal pain may be related to adhesions, given the reliable exacerbation with eating and significant nausea vomiting component.  However gut brain axis disorder/IBS also high on the differential given the chronicity and her risk factors.  We discussed what gut brain axis disorders are, the proposed pathophysiology and the principles of management.  We will follow-up in clinic after her endoscopies to further discuss treatment options. Regarding her dysphagia, the abnormal barium swallow indicates that there is an anatomic abnormality such as a stricture which warrants a diagnostic/therapeutic upper endoscopy.   Abdominal pain, chronic, unresponsive to Bentyl, Tylenol, heat packs - Colonoscopy to exclude Crohn's disease - Recommended low residue diet in the meantime, in the event that this could be related to surgical adhesions  Dysphagia, with abnormal barium swallow - EGD with likely dilation  Diarrhea, chronic - Colonoscopy with random colon biopsies to exclude microscopic colitis -We will discuss different therapeutic options with patient after her procedures  I spent 55 minutes reviewing the patient's medical records, seeing and examining the patient, discussing potential diagnoses and management and documenting her care O'Buch, Greta, PA-C

## 2020-10-03 ENCOUNTER — Telehealth: Payer: Self-pay | Admitting: Gastroenterology

## 2020-10-03 NOTE — Telephone Encounter (Signed)
Hi Dr. Candis Schatz, this patient just called to cancel procedure that was scheduled for tomorrow 8/12 because her child is sick. Please advise if it is ok to reschedule pt when she calls back to r/s or if she needs another ov. Thank you.

## 2020-10-04 ENCOUNTER — Encounter: Payer: Medicaid Other | Admitting: Gastroenterology

## 2020-10-24 ENCOUNTER — Telehealth: Payer: Self-pay

## 2020-10-24 NOTE — Telephone Encounter (Signed)
Phone number: [phone]  Insurance: [employer](medicaid)--note the pt was notified that we are not taking any new ADULT medicaid pts.  Husband: Mary Williams, [name] stated he is a pt of Dr. Tobie Poet.  The pt listed above is requesting to schedule a new pt to re-est apt. The pt was dismissed due to continued no shows in 2019.  Is it ok to schedule her as a new pt to re-est?

## 2020-11-05 NOTE — Telephone Encounter (Signed)
Pt was notified that we will not be able to schedule her a new pt to re-est appointment per office policy/provider.
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
## 2021-07-25 ENCOUNTER — Encounter (HOSPITAL_COMMUNITY): Payer: Self-pay | Admitting: *Deleted

## 2022-01-07 ENCOUNTER — Other Ambulatory Visit: Payer: Self-pay

## 2022-01-07 ENCOUNTER — Emergency Department (HOSPITAL_BASED_OUTPATIENT_CLINIC_OR_DEPARTMENT_OTHER)
Admission: EM | Admit: 2022-01-07 | Discharge: 2022-01-08 | Disposition: A | Discharge status: Home / Self Care | Payer: Medicaid Other | Attending: Emergency Medicine | Admitting: Emergency Medicine

## 2022-01-07 ENCOUNTER — Encounter (HOSPITAL_BASED_OUTPATIENT_CLINIC_OR_DEPARTMENT_OTHER): Payer: Self-pay | Admitting: Emergency Medicine

## 2022-01-07 ENCOUNTER — Emergency Department (HOSPITAL_BASED_OUTPATIENT_CLINIC_OR_DEPARTMENT_OTHER): Payer: Medicaid Other

## 2022-01-07 DIAGNOSIS — R55 Syncope and collapse: Secondary | ICD-10-CM | POA: Insufficient documentation

## 2022-01-07 DIAGNOSIS — K0889 Other specified disorders of teeth and supporting structures: Secondary | ICD-10-CM | POA: Insufficient documentation

## 2022-01-07 DIAGNOSIS — Z79899 Other long term (current) drug therapy: Secondary | ICD-10-CM | POA: Diagnosis not present

## 2022-01-07 DIAGNOSIS — R000 Tachycardia, unspecified: Secondary | ICD-10-CM | POA: Insufficient documentation

## 2022-01-07 DIAGNOSIS — R5383 Other fatigue: Secondary | ICD-10-CM | POA: Diagnosis not present

## 2022-01-07 LAB — RAPID URINE DRUG SCREEN, HOSP PERFORMED
Amphetamines: NOT DETECTED
Barbiturates: NOT DETECTED
Benzodiazepines: NOT DETECTED
Cocaine: NOT DETECTED
Opiates: POSITIVE — AB
Tetrahydrocannabinol: NOT DETECTED

## 2022-01-07 LAB — CBC
HCT: 38 % (ref 36.0–46.0)
Hemoglobin: 11.9 g/dL — ABNORMAL LOW (ref 12.0–15.0)
MCH: 28.2 pg (ref 26.0–34.0)
MCHC: 31.3 g/dL (ref 30.0–36.0)
MCV: 90 fL (ref 80.0–100.0)
Platelets: 360 10*3/uL (ref 150–400)
RBC: 4.22 MIL/uL (ref 3.87–5.11)
RDW: 14.6 % (ref 11.5–15.5)
WBC: 10.1 10*3/uL (ref 4.0–10.5)
nRBC: 0 % (ref 0.0–0.2)

## 2022-01-07 LAB — URINALYSIS, ROUTINE W REFLEX MICROSCOPIC
Bilirubin Urine: NEGATIVE
Glucose, UA: NEGATIVE mg/dL
Hgb urine dipstick: NEGATIVE
Ketones, ur: NEGATIVE mg/dL
Leukocytes,Ua: NEGATIVE
Nitrite: NEGATIVE
Protein, ur: NEGATIVE mg/dL
Specific Gravity, Urine: 1.01 (ref 1.005–1.030)
pH: 5.5 (ref 5.0–8.0)

## 2022-01-07 LAB — ACETAMINOPHEN LEVEL: Acetaminophen (Tylenol), Serum: <10 ug/mL — ABNORMAL LOW (ref 10–30)

## 2022-01-07 LAB — BASIC METABOLIC PANEL
Anion gap: 7 (ref 5–15)
BUN: 11 mg/dL (ref 6–20)
CO2: 19 mmol/L — ABNORMAL LOW (ref 22–32)
Calcium: 8 mg/dL — ABNORMAL LOW (ref 8.9–10.3)
Chloride: 111 mmol/L (ref 98–111)
Creatinine, Ser: 0.76 mg/dL (ref 0.44–1.00)
GFR, Estimated: >60 mL/min (ref >60)
Glucose, Bld: 135 mg/dL — ABNORMAL HIGH (ref 70–99)
Potassium: 3.6 mmol/L (ref 3.5–5.1)
Sodium: 137 mmol/L (ref 135–145)

## 2022-01-07 LAB — CBG MONITORING, ED: Glucose-Capillary: 128 mg/dL — ABNORMAL HIGH (ref 70–99)

## 2022-01-07 LAB — SALICYLATE LEVEL: Salicylate Lvl: <7 mg/dL — ABNORMAL LOW (ref 7.0–30.0)

## 2022-01-07 LAB — ETHANOL: Alcohol, Ethyl (B): <10 mg/dL (ref <10)

## 2022-01-07 MED ORDER — SODIUM CHLORIDE 0.9 % IV BOLUS
1000.0000 mL | Freq: Once | INTRAVENOUS | Status: AC
  Administered 2022-01-07: 1000 mL via INTRAVENOUS

## 2022-01-07 NOTE — ED Notes (Signed)
Notified PT family urine sample is needed.

## 2022-01-07 NOTE — ED Provider Notes (Signed)
Emergency Department Provider Note   I have reviewed the triage vital signs and the nursing notes.   HISTORY  Chief Complaint Loss of Consciousness and Dental Pain   HPI Mary Williams is a 46 y.o. female with PMH of anxiety presents to the emergency department for evaluation of right dental pain over the past 2 days.  Patient has been and will having discomfort and taking Tylenol as needed for pain.  She had an apparent syncope episode today which was witnessed by the daughter.  No seizure activity.  Patient felt like she had severe pain leading to syncope and fell.  She apparently struck her head on the cabinet on the way down which the daughter states seem to wake her up.  Patient denies drinking or using drugs.  She has been under significant increased stress over the past month with the death of her husband.  She denies taking extra medication or intentionally trying to harm herself.  Has not been drinking or using drugs.  No fevers.    Past Medical History:  Diagnosis Date   Anxiety    GERD (gastroesophageal reflux disease)    History of hiatal hernia    Molar pregnancy yrs ago   mOLAR PREGNACY CANCEROUS REMOVED   Pneumonia yrs ago    Review of Systems  Constitutional: No fever/chills Eyes: No visual changes. ENT: No sore throat. Positive dental pain.  Cardiovascular: Denies chest pain. Respiratory: Denies shortness of breath. Gastrointestinal: No abdominal pain.  Musculoskeletal: Negative for back pain. Skin: Negative for rash. Neurological: Positive HA.  ____________________________________________   PHYSICAL EXAM:  VITAL SIGNS: ED Triage Vitals  Enc Vitals Group     BP 01/07/22 2108 (!) 110/91     Pulse Rate 01/07/22 2108 (!) 108     Resp 01/07/22 2108 18     Temp 01/07/22 2108 98.3 F (36.8 C)     Temp src --      SpO2 01/07/22 2108 100 %     Weight 01/07/22 2105 190 lb (86.2 kg)     Height 01/07/22 2105 '5\' 4"'$  (1.626 m)   Constitutional:  Somnolent. Awakens with touch and loud voice but then falls back asleep.  Eyes: Conjunctivae are normal. PERRL (4 mm). Head: Atraumatic. Nose: No congestion/rhinnorhea. Mouth/Throat: Mucous membranes are dry.  Neck: No stridor.  Cardiovascular: Tachycardia. Good peripheral circulation. Grossly normal heart sounds.   Respiratory: Normal respiratory effort.  No retractions. Lungs CTAB. Gastrointestinal: Soft and nontender. No distention.  Musculoskeletal: No lower extremity tenderness nor edema. No gross deformities of extremities. Neurologic:  Normal speech and language with frequent prompting. No gross focal neurologic deficits are appreciated.  Skin:  Skin is warm, dry and intact. No rash noted.   ____________________________________________   LABS (all labs ordered are listed, but only abnormal results are displayed)  Labs Reviewed  BASIC METABOLIC PANEL - Abnormal; Notable for the following components:      Result Value   CO2 19 (*)    Glucose, Bld 135 (*)    Calcium 8.0 (*)    All other components within normal limits  CBC - Abnormal; Notable for the following components:   Hemoglobin 11.9 (*)    All other components within normal limits  RAPID URINE DRUG SCREEN, HOSP PERFORMED - Abnormal; Notable for the following components:   Opiates POSITIVE (*)    All other components within normal limits  ACETAMINOPHEN LEVEL - Abnormal; Notable for the following components:   Acetaminophen (Tylenol), Serum <10 (*)  All other components within normal limits  SALICYLATE LEVEL - Abnormal; Notable for the following components:   Salicylate Lvl <1.6 (*)    All other components within normal limits  CBG MONITORING, ED - Abnormal; Notable for the following components:   Glucose-Capillary 128 (*)    All other components within normal limits  URINALYSIS, ROUTINE W REFLEX MICROSCOPIC  ETHANOL   ____________________________________________  EKG   EKG  Interpretation  Date/Time:  Wednesday January 07 2022 21:16:01 EST Ventricular Rate:  102 PR Interval:  113 QRS Duration: 107 QT Interval:  353 QTC Calculation: 460 R Axis:   77 Text Interpretation: Sinus tachycardia Confirmed by Georgina Snell [phone]) on 01/08/2022 8:49:28 AM        ____________________________________________  RADIOLOGY  CT HEAD WO CONTRAST  Result Date: 01/07/2022 CLINICAL DATA:  Head trauma, GCS=15, no focal neuro findings (low risk) (Ped 0-17y) EXAM: CT HEAD WITHOUT CONTRAST TECHNIQUE: Contiguous axial images were obtained from the base of the skull through the vertex without intravenous contrast. RADIATION DOSE REDUCTION: This exam was performed according to the departmental dose-optimization program which includes automated exposure control, adjustment of the mA and/or kV according to patient size and/or use of iterative reconstruction technique. COMPARISON:  CT head 08/22/2019 FINDINGS: Brain: No evidence of large-territorial acute infarction. No parenchymal hemorrhage. No mass lesion. No extra-axial collection. No mass effect or midline shift. No hydrocephalus. Basilar cisterns are patent. Vascular: No hyperdense vessel. Skull: No acute fracture or focal lesion. Sinuses/Orbits: Paranasal sinuses and mastoid air cells are clear. The orbits are unremarkable. Other: None. IMPRESSION: No acute intracranial abnormality. Electronically Signed   By: Iven Finn M.D.   On: 01/07/2022 21:49    ____________________________________________   PROCEDURES  Procedure(s) performed:   Procedures  None  ____________________________________________   INITIAL IMPRESSION / ASSESSMENT AND PLAN / ED COURSE  Pertinent labs & imaging results that were available during my care of the patient were reviewed by me and considered in my medical decision making (see chart for details).   This patient is Presenting for Evaluation of AMS, which does require a range of  treatment options, and is a complaint that involves a high risk of morbidity and mortality.  The Differential Diagnoses includes but is not exclusive to alcohol, illicit or prescription medications, intracranial pathology such as stroke, intracerebral hemorrhage, fever or infectious causes including sepsis, hypoxemia, uremia, trauma, endocrine related disorders such as diabetes, hypoglycemia, thyroid-related diseases, etc.  I did obtain Additional Historical Information from daughter at bedside.   Clinical Laboratory Tests Ordered, included UDS positive for opiates. No UTI. No leukocytosis. EtOH negative.   Radiologic Tests Ordered, included CT head. I independently interpreted the images and agree with radiology interpretation.   Cardiac Monitor Tracing which shows NSR.   Social Determinants of Health Risk patient is a non-smoker.   Medical Decision Making: Summary:  Patient presents to the emergency department with dental pain and syncope.  No trismus.  No evidence of deeper space infection. Patient is somnolent on my initial assessment. Plan for CT imaging of the head and AMS workup as well.   Reevaluation with update and discussion with patient and family. Will continue to follow remaining labs. Care transferred to Dr. Stark Jock.   Disposition: pending   ____________________________________________  FINAL CLINICAL IMPRESSION(S) / ED DIAGNOSES  Final diagnoses:  Syncope and collapse  Other fatigue     NEW OUTPATIENT MEDICATIONS STARTED DURING THIS VISIT:  Discharge Medication List as of 01/08/2022  1:19 AM  START taking these medications   Details  clindamycin (CLEOCIN) 300 MG capsule Take 1 capsule (300 mg total) by mouth 4 (four) times daily. X 7 days, Starting Thu 01/08/2022, Normal        Note:  This document was prepared using Dragon voice recognition software and may include unintentional dictation errors.  Nanda Quinton, MD, [facility]    Fredrick Geoghegan,  [facility], MD 01/09/22 [phone]

## 2022-01-07 NOTE — ED Triage Notes (Signed)
Right upper dental pain x 2 days. Tonight had a syncopal episode. Thinks she passed out due to the pain. + LOC. Hit head on a cabinet. Denies blood thinners. Denies injury from fall. Pt a&o x 4 in triage.

## 2022-01-08 MED ORDER — CLINDAMYCIN HCL 300 MG PO CAPS: 300.0000 mg | ORAL_CAPSULE | Freq: Four times a day (QID) | ORAL | 0 refills | Status: DC

## 2022-01-08 MED ORDER — CLINDAMYCIN HCL 150 MG PO CAPS
300.0000 mg | ORAL_CAPSULE | Freq: Once | ORAL | Status: AC
  Administered 2022-01-08: 300 mg via ORAL
  Filled 2022-01-08: qty 2

## 2022-01-08 NOTE — ED Provider Notes (Signed)
  Physical Exam  BP 138/81   Pulse 100   Temp 98.3 F (36.8 C)   Resp (!) 22   Ht '5\' 4"'$  (1.626 m)   Wt 86.2 kg   SpO2 97%   BMI 32.61 kg/m   Physical Exam Vitals and nursing note reviewed.  Constitutional:      Appearance: Normal appearance. She is not toxic-appearing or diaphoretic.     Comments: Patient is somnolent, but arousable.  Pulmonary:     Effort: Pulmonary effort is normal.  Skin:    General: Skin is warm and dry.  Neurological:     Mental Status: She is oriented to person, place, and time.     Procedures  Procedures  ED Course / MDM  Care assumed from Dr. Laverta Baltimore at shift change.  Patient awaiting results of urinalysis and reassessment after receiving IV fluids.  Briefly, patient has been somewhat despondent since losing her husband 1 month ago.  She has not been sleeping and has had little appetite.  She had some sort of syncopal spell earlier today and hit her head on a cabinet.  Thus far laboratory studies and CT scan of the head are unremarkable.  Urinalysis has resulted and is clear.  She does have positive opiates on her drug screen.  At this point, patient seems appropriate for discharge.  Her daughter at bedside states that she is getting rest that she has not had in quite some time and feels comfortable with taking her home.       Veryl Speak, MD 01/08/22 0100

## 2022-01-08 NOTE — Discharge Instructions (Signed)
Continue medications as previously prescribed.  Return to the emergency department for any new and/or concerning symptoms.

## 2022-08-04 ENCOUNTER — Encounter (HOSPITAL_COMMUNITY): Payer: Self-pay | Admitting: *Deleted

## 2022-08-07 ENCOUNTER — Other Ambulatory Visit (HOSPITAL_COMMUNITY): Payer: Self-pay

## 2022-08-07 MED ORDER — PHENTERMINE HCL 37.5 MG PO TABS
37.5000 mg | ORAL_TABLET | Freq: Every morning | ORAL | 0 refills | Status: AC
  Filled 2022-08-07: qty 30, 30d supply, fill #0

## 2022-08-10 ENCOUNTER — Other Ambulatory Visit (HOSPITAL_COMMUNITY): Payer: Self-pay

## 2022-09-06 ENCOUNTER — Emergency Department (HOSPITAL_COMMUNITY)
Admission: EM | Admit: 2022-09-06 | Discharge: 2022-09-06 | Disposition: A | Discharge status: Home / Self Care | Payer: Medicaid Other | Attending: Emergency Medicine | Admitting: Emergency Medicine

## 2022-09-06 ENCOUNTER — Emergency Department (HOSPITAL_COMMUNITY): Payer: Medicaid Other

## 2022-09-06 DIAGNOSIS — Z23 Encounter for immunization: Secondary | ICD-10-CM | POA: Insufficient documentation

## 2022-09-06 DIAGNOSIS — S81012A Laceration without foreign body, left knee, initial encounter: Secondary | ICD-10-CM | POA: Diagnosis present

## 2022-09-06 DIAGNOSIS — Y92410 Unspecified street and highway as the place of occurrence of the external cause: Secondary | ICD-10-CM | POA: Diagnosis not present

## 2022-09-06 DIAGNOSIS — Y9229 Other specified public building as the place of occurrence of the external cause: Secondary | ICD-10-CM | POA: Diagnosis not present

## 2022-09-06 LAB — I-STAT CHEM 8, ED
BUN: 7 mg/dL (ref 6–20)
Calcium, Ion: 1.03 mmol/L — ABNORMAL LOW (ref 1.15–1.40)
Chloride: 109 mmol/L (ref 98–111)
Creatinine, Ser: 0.8 mg/dL (ref 0.44–1.00)
Glucose, Bld: 109 mg/dL — ABNORMAL HIGH (ref 70–99)
HCT: 36 % (ref 36.0–46.0)
Hemoglobin: 12.2 g/dL (ref 12.0–15.0)
Potassium: 3.5 mmol/L (ref 3.5–5.1)
Sodium: 139 mmol/L (ref 135–145)
TCO2: 19 mmol/L — ABNORMAL LOW (ref 22–32)

## 2022-09-06 MED ORDER — SULFAMETHOXAZOLE-TRIMETHOPRIM 800-160 MG PO TABS: 1.0000 | ORAL_TABLET | Freq: Two times a day (BID) | ORAL | 0 refills | Status: AC

## 2022-09-06 MED ORDER — LORAZEPAM 2 MG/ML IJ SOLN
2.0000 mg | Freq: Once | INTRAMUSCULAR | Status: AC
  Filled 2022-09-06: qty 1

## 2022-09-06 MED ORDER — HYDROMORPHONE HCL 1 MG/ML IJ SOLN
1.0000 mg | Freq: Once | INTRAMUSCULAR | Status: AC
  Administered 2022-09-06: 1 mg via INTRAVENOUS
  Filled 2022-09-06: qty 1

## 2022-09-06 MED ORDER — CEFAZOLIN SODIUM-DEXTROSE 2-4 GM/100ML-% IV SOLN
2.0000 g | INTRAVENOUS | Status: AC
  Administered 2022-09-06: 2 g via INTRAVENOUS
  Filled 2022-09-06: qty 100

## 2022-09-06 MED ORDER — LIDOCAINE HCL 2 % IJ SOLN
10.0000 mL | Freq: Once | INTRAMUSCULAR | Status: AC
  Administered 2022-09-06: 200 mg
  Filled 2022-09-06: qty 20

## 2022-09-06 MED ORDER — IOHEXOL 350 MG/ML SOLN
100.0000 mL | Freq: Once | INTRAVENOUS | Status: AC | PRN
  Administered 2022-09-06: 100 mL via INTRAVENOUS

## 2022-09-06 MED ORDER — LORAZEPAM 2 MG/ML IJ SOLN
INTRAMUSCULAR | Status: AC
  Administered 2022-09-06: 2 mg via INTRAVENOUS
  Filled 2022-09-06: qty 1

## 2022-09-06 MED ORDER — MORPHINE SULFATE (PF) 4 MG/ML IV SOLN
4.0000 mg | Freq: Once | INTRAVENOUS | Status: AC
  Administered 2022-09-06: 4 mg via INTRAVENOUS
  Filled 2022-09-06: qty 1

## 2022-09-06 MED ORDER — TETANUS-DIPHTH-ACELL PERTUSSIS 5-2.5-18.5 LF-MCG/0.5 IM SUSY
0.5000 mL | PREFILLED_SYRINGE | Freq: Once | INTRAMUSCULAR | Status: AC
  Administered 2022-09-06: 0.5 mL via INTRAMUSCULAR
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
## 2022-09-06 NOTE — Progress Notes (Signed)
Orthopedic Tech Progress Note Patient Details:  KATERYNA CUELLAR 09-27-1975 [id number]  Patient ID: Remer Macho, female   DOB: 1975/05/30, 47 y.o.   MRN: [id number] Level II; not currently needed. Darleen Crocker 09/06/2022, 2:59 PM
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
## 2022-09-06 NOTE — ED Notes (Signed)
Pt was able to hold the fluids down.
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
